# Patient Record
Sex: Male | Born: 1995 | Race: Black or African American | Hispanic: No | Marital: Single | Smoking: Current some day smoker
Health system: Southern US, Community
[De-identification: ages and names within clinical notes are randomized; demographics above are authoritative.]

## PROBLEM LIST (undated history)

## (undated) DIAGNOSIS — E119 Type 2 diabetes mellitus without complications: Secondary | ICD-10-CM

## (undated) DIAGNOSIS — E1010 Type 1 diabetes mellitus with ketoacidosis without coma: Secondary | ICD-10-CM

## (undated) HISTORY — PX: HAND SURGERY: SHX662

## (undated) HISTORY — PX: UMBILICAL HERNIA REPAIR: SHX196

## (undated) HISTORY — PX: TONSILLECTOMY: SUR1361

## (undated) HISTORY — PX: TONSILLECTOMY AND ADENOIDECTOMY: SHX28

---
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
## 2016-09-01 ENCOUNTER — Encounter: Payer: Self-pay | Admitting: Emergency Medicine

## 2016-09-01 ENCOUNTER — Inpatient Hospital Stay
Admission: EM | Admit: 2016-09-01 | Discharge: 2016-09-03 | DRG: 638 | Disposition: A | Discharge status: Home / Self Care | Payer: Medicaid Other | Attending: Internal Medicine | Admitting: Internal Medicine

## 2016-09-01 DIAGNOSIS — F17210 Nicotine dependence, cigarettes, uncomplicated: Secondary | ICD-10-CM | POA: Diagnosis present

## 2016-09-01 DIAGNOSIS — E1010 Type 1 diabetes mellitus with ketoacidosis without coma: Secondary | ICD-10-CM | POA: Diagnosis present

## 2016-09-01 DIAGNOSIS — N179 Acute kidney failure, unspecified: Secondary | ICD-10-CM | POA: Diagnosis present

## 2016-09-01 DIAGNOSIS — Z833 Family history of diabetes mellitus: Secondary | ICD-10-CM | POA: Diagnosis not present

## 2016-09-01 DIAGNOSIS — E10649 Type 1 diabetes mellitus with hypoglycemia without coma: Secondary | ICD-10-CM | POA: Diagnosis not present

## 2016-09-01 DIAGNOSIS — Z794 Long term (current) use of insulin: Secondary | ICD-10-CM

## 2016-09-01 DIAGNOSIS — Z23 Encounter for immunization: Secondary | ICD-10-CM

## 2016-09-01 DIAGNOSIS — E1110 Type 2 diabetes mellitus with ketoacidosis without coma: Secondary | ICD-10-CM | POA: Diagnosis present

## 2016-09-01 LAB — URINALYSIS COMPLETE WITH MICROSCOPIC (ARMC ONLY)
BACTERIA UA: NONE SEEN
BILIRUBIN URINE: NEGATIVE
HGB URINE DIPSTICK: NEGATIVE
Leukocytes, UA: NEGATIVE
Nitrite: NEGATIVE
Protein, ur: NEGATIVE mg/dL
RBC / HPF: NONE SEEN RBC/hpf (ref 0–5)
Specific Gravity, Urine: 1.026 (ref 1.005–1.030)
pH: 5 (ref 5.0–8.0)

## 2016-09-01 LAB — BASIC METABOLIC PANEL
ANION GAP: 9 (ref 5–15)
Anion gap: 20 — ABNORMAL HIGH (ref 5–15)
BUN: 16 mg/dL (ref 6–20)
BUN: 13 mg/dL (ref 6–20)
CHLORIDE: 102 mmol/L (ref 101–111)
CO2: 15 mmol/L — AB (ref 22–32)
CO2: 22 mmol/L (ref 22–32)
CREATININE: 1.42 mg/dL — AB (ref 0.61–1.24)
Calcium: 9.1 mg/dL (ref 8.9–10.3)
Calcium: 9 mg/dL (ref 8.9–10.3)
Chloride: 106 mmol/L (ref 101–111)
Creatinine, Ser: 0.99 mg/dL (ref 0.61–1.24)
GFR calc Af Amer: >60 mL/min (ref >60)
GFR calc Af Amer: >60 mL/min (ref >60)
GFR calc non Af Amer: >60 mL/min (ref >60)
GLUCOSE: 360 mg/dL — AB (ref 65–99)
GLUCOSE: 75 mg/dL (ref 65–99)
POTASSIUM: 4 mmol/L (ref 3.5–5.1)
Potassium: 4.4 mmol/L (ref 3.5–5.1)
SODIUM: 137 mmol/L (ref 135–145)
Sodium: 137 mmol/L (ref 135–145)

## 2016-09-01 LAB — GLUCOSE, CAPILLARY
GLUCOSE-CAPILLARY: 350 mg/dL — AB (ref 65–99)
GLUCOSE-CAPILLARY: 72 mg/dL (ref 65–99)
GLUCOSE-CAPILLARY: 83 mg/dL (ref 65–99)
GLUCOSE-CAPILLARY: 114 mg/dL — AB (ref 65–99)
Glucose-Capillary: 106 mg/dL — ABNORMAL HIGH (ref 65–99)
Glucose-Capillary: 125 mg/dL — ABNORMAL HIGH (ref 65–99)
Glucose-Capillary: 67 mg/dL (ref 65–99)

## 2016-09-01 LAB — CBC
HEMATOCRIT: 49.5 % (ref 40.0–52.0)
Hemoglobin: 16.6 g/dL (ref 13.0–18.0)
MCH: 30.3 pg (ref 26.0–34.0)
MCHC: 33.6 g/dL (ref 32.0–36.0)
MCV: 90 fL (ref 80.0–100.0)
PLATELETS: 248 10*3/uL (ref 150–440)
RBC: 5.49 MIL/uL (ref 4.40–5.90)
RDW: 13.5 % (ref 11.5–14.5)
WBC: 6.2 10*3/uL (ref 3.8–10.6)

## 2016-09-01 LAB — MRSA PCR SCREENING: MRSA by PCR: NEGATIVE

## 2016-09-01 MED ORDER — ENOXAPARIN SODIUM 40 MG/0.4ML ~~LOC~~ SOLN
40.0000 mg | SUBCUTANEOUS | Status: DC
  Administered 2016-09-01 – 2016-09-02 (×2): 40 mg via SUBCUTANEOUS
  Filled 2016-09-01 (×2): qty 0.4

## 2016-09-01 MED ORDER — SODIUM CHLORIDE 0.9 % IV SOLN
INTRAVENOUS | Status: DC
  Administered 2016-09-01: 2.9 [IU]/h via INTRAVENOUS
  Filled 2016-09-01: qty 2.5

## 2016-09-01 MED ORDER — DEXTROSE-NACL 5-0.45 % IV SOLN
INTRAVENOUS | Status: DC
  Administered 2016-09-01 – 2016-09-03 (×3): via INTRAVENOUS

## 2016-09-01 MED ORDER — ACETAMINOPHEN 325 MG PO TABS: 650.0000 mg | ORAL_TABLET | Freq: Four times a day (QID) | ORAL | Status: DC | PRN

## 2016-09-01 MED ORDER — INSULIN ASPART 100 UNIT/ML ~~LOC~~ SOLN
0.0000 [IU] | Freq: Every day | SUBCUTANEOUS | Status: DC
  Administered 2016-09-02: 2 [IU] via SUBCUTANEOUS
  Filled 2016-09-01: qty 2

## 2016-09-01 MED ORDER — SODIUM CHLORIDE 0.9 % IV SOLN: INTRAVENOUS | Status: AC

## 2016-09-01 MED ORDER — SODIUM CHLORIDE 0.9% FLUSH
3.0000 mL | Freq: Two times a day (BID) | INTRAVENOUS | Status: DC
  Administered 2016-09-01 – 2016-09-02 (×2): 3 mL via INTRAVENOUS

## 2016-09-01 MED ORDER — ACETAMINOPHEN 650 MG RE SUPP: 650.0000 mg | Freq: Four times a day (QID) | RECTAL | Status: DC | PRN

## 2016-09-01 MED ORDER — INSULIN ASPART 100 UNIT/ML ~~LOC~~ SOLN
0.0000 [IU] | Freq: Three times a day (TID) | SUBCUTANEOUS | Status: DC
  Administered 2016-09-02: 2 [IU] via SUBCUTANEOUS
  Administered 2016-09-02: 3 [IU] via SUBCUTANEOUS
  Filled 2016-09-01: qty 2
  Filled 2016-09-01: qty 8

## 2016-09-01 MED ORDER — POTASSIUM CHLORIDE IN NACL 20-0.9 MEQ/L-% IV SOLN
INTRAVENOUS | Status: DC
  Administered 2016-09-01: 17:00:00 via INTRAVENOUS
  Filled 2016-09-01: qty 1000

## 2016-09-01 MED ORDER — SODIUM CHLORIDE 0.9 % IV SOLN
INTRAVENOUS | Status: DC
  Filled 2016-09-01: qty 2.5

## 2016-09-01 MED ORDER — ONDANSETRON HCL 4 MG PO TABS: 4.0000 mg | ORAL_TABLET | Freq: Four times a day (QID) | ORAL | Status: DC | PRN

## 2016-09-01 MED ORDER — SODIUM CHLORIDE 0.9 % IV SOLN
INTRAVENOUS | Status: DC
  Administered 2016-09-02 (×2): via INTRAVENOUS

## 2016-09-01 MED ORDER — SODIUM CHLORIDE 0.9 % IV BOLUS (SEPSIS)
1000.0000 mL | Freq: Once | INTRAVENOUS | Status: AC
  Administered 2016-09-01: 1000 mL via INTRAVENOUS

## 2016-09-01 MED ORDER — ONDANSETRON HCL 4 MG/2ML IJ SOLN: 4.0000 mg | Freq: Four times a day (QID) | INTRAMUSCULAR | Status: DC | PRN

## 2016-09-01 MED ORDER — INFLUENZA VAC SPLIT QUAD 0.5 ML IM SUSY: 0.5000 mL | PREFILLED_SYRINGE | INTRAMUSCULAR | Status: DC

## 2016-09-01 NOTE — ED Triage Notes (Addendum)
Pt to ED via EMS from home for hyperglycemia, per EMS glucose 377, fluid bolus going. Pt has been vomiting today. Per EMS pt lost his glucometer x2days ago and hasn't checked it. Pt has had aprox of fluid en route.  Pt A&Ox4

## 2016-09-01 NOTE — ED Notes (Signed)
Pt states he has been feeling drowsy with some intermit nausea.

## 2016-09-01 NOTE — ED Notes (Signed)
ED Provider at bedside. 

## 2016-09-01 NOTE — ED Notes (Signed)
Pt given diet drink, sugar free applesauce and graham crakers

## 2016-09-01 NOTE — ED Provider Notes (Signed)
[facility] Emergency Department Provider Note  ____________________________________________   I have reviewed the triage vital signs and the nursing notes.   HISTORY  Chief Complaint Hyperglycemia   History limited by: Not Limited   HPI Wesley [name] is a 20 y.o. male with history of diabetes who presents to the emergency department today because he has been feeling unwell and concerned that his sugars are high. The patient states that he has not had his glucometer for the past few days. He has still been giving himself his insulin however has been doing just based on how he feels. His morning he woke up he started feeling bad. He started feeling weak. There is some nausea. He also was not close to his insulin this morning. As he continued to feel worse he realized he had to come to the emergency department. He has been in diabetic ketoacidosis in the past and states this reminds him of how he felt. He denies any fevers or recent illness.   Past Medical History:  Diagnosis Date  . Diabetes mellitus without complication [facility](HCC)     Patient Active Problem List   Diagnosis Date Noted  . Sepsis (HCC) 10/02/2015  . Scrotal abscess   . Diabetes mellitus with ketoacidosis 08/13/2012  . Type 1 diabetes mellitus (HCC) 02/06/2012    Past Surgical History:  Procedure Laterality Date  . HAND SURGERY    . INCISION AND DRAINAGE ABSCESS N/A 10/02/2015   Procedure: INCISION AND DRAINAGE ABSCESS;  Surgeon: Jerilee FieldMatthew Eskridge, MD;  Location: [facility] ORS;  Service: Urology;  Laterality: N/A;  . TONSILLECTOMY AND ADENOIDECTOMY    . UMBILICAL HERNIA REPAIR      Prior to Admission medications   Medication Sig Start Date End Date Taking? Authorizing Provider  insulin aspart (NOVOLOG) 100 UNIT/ML injection Inject 10-15 Units into the skin 3 (three) times daily before meals. Per sliding scale    Historical Provider, MD  insulin glargine (LANTUS) 100 UNIT/ML injection Inject 40  Units into the skin daily.    Historical Provider, MD  sulfamethoxazole-trimethoprim (BACTRIM DS,SEPTRA DS) 800-160 MG tablet Take 1 tablet by mouth 2 (two) times daily. 10/03/15   Houston SirenVivek J Sainani, MD    Allergies Patient has no known allergies.  Family History  Problem Relation Age of Onset  . Diabetes Mellitus II Paternal Grandfather     Social History Social History  Substance Use Topics  . Smoking status: Current Some Day Smoker    Types: Cigarettes  . Smokeless tobacco: Never Used  . Alcohol use No    Review of Systems  Constitutional: Negative for fever. Cardiovascular: Negative for chest pain. Respiratory: Negative for shortness of breath. Gastrointestinal: Negative for abdominal pain. Neurological: Negative for headaches, focal weakness or numbness.  10-point ROS otherwise negative.  ____________________________________________   PHYSICAL EXAM:  VITAL SIGNS: ED Triage Vitals  Enc Vitals Group     BP 09/01/16 1504 119/78     Pulse Rate 09/01/16 1504 95     Resp 09/01/16 1504 16     Temp 09/01/16 1504 98 F (36.7 C)     Temp Source 09/01/16 1504 Oral     SpO2 09/01/16 1504 100 %     Weight 09/01/16 1500 170 lb (77.1 kg)     Height 09/01/16 1500 5\' 9"  (1.753 m)     Head Circumference --      Peak Flow --      Pain Score 09/01/16 1500 0   Constitutional: Alert and oriented.  Well appearing and in no distress. Eyes: Conjunctivae are normal. Normal extraocular movements. ENT   Head: Normocephalic and atraumatic.   Nose: No congestion/rhinnorhea.   Mouth/Throat: Mucous membranes are moist.   Neck: No stridor. Hematological/Lymphatic/Immunilogical: No cervical lymphadenopathy. Cardiovascular: Normal rate, regular rhythm.  No murmurs, rubs, or gallops.  Respiratory: Normal respiratory effort without tachypnea nor retractions. Breath sounds are clear and equal bilaterally. No wheezes/rales/rhonchi. Gastrointestinal: Soft and nontender. No  distention.  Genitourinary: Deferred Musculoskeletal: Normal range of motion in all extremities. No lower extremity edema. Neurologic:  Normal speech and language. No gross focal neurologic deficits are appreciated.  Skin:  Skin is warm, dry and intact. No rash noted. Psychiatric: Mood and affect are normal. Speech and behavior are normal. Patient exhibits appropriate insight and judgment.  ____________________________________________    LABS (pertinent positives/negatives)  Labs Reviewed  BASIC METABOLIC PANEL - Abnormal; Notable for the following:       Result Value   CO2 15 (*)    Glucose, Bld 360 (*)    Creatinine, Ser 1.42 (*)    Anion gap 20 (*)    All other components within normal limits  URINALYSIS COMPLETEWITH MICROSCOPIC ([facility] ONLY) - Abnormal; Notable for the following:    Color, Urine STRAW (*)    APPearance CLEAR (*)    Glucose, UA >500 (*)    Ketones, ur 2+ (*)    Squamous Epithelial / LPF 0-5 (*)    All other components within normal limits  GLUCOSE, CAPILLARY - Abnormal; Notable for the following:    Glucose-Capillary 350 (*)    All other components within normal limits  BLOOD GAS, VENOUS - Abnormal; Notable for the following:    pH, Ven 7.17 (*)    pO2, Ven <31.0 (*)    Bicarbonate 16.4 (*)    Acid-base deficit 11.9 (*)    All other components within normal limits  CBC  CBG MONITORING, ED     ____________________________________________   EKG  None  ____________________________________________    RADIOLOGY  None  ____________________________________________   PROCEDURES  Procedures  CRITICAL CARE Performed by: Phineas Semen   Total critical care time: 20 minutes  Critical care time was exclusive of separately billable procedures and treating other patients.  Critical care was necessary to treat or prevent imminent or life-threatening deterioration.  Critical care was time spent personally by me on the following activities:  development of treatment plan with patient and/or surrogate as well as nursing, discussions with consultants, evaluation of patient's response to treatment, examination of patient, obtaining history from patient or surrogate, ordering and performing treatments and interventions, ordering and review of laboratory studies, ordering and review of radiographic studies, pulse oximetry and re-evaluation of patient's condition.  ____________________________________________   INITIAL IMPRESSION / ASSESSMENT AND PLAN / ED COURSE  Pertinent labs & imaging results that were available during my care of the patient were reviewed by me and considered in my medical decision making (see chart for details).  Patient presented to the emergency department today because of concerns for elevated blood sugar and not feeling well. Patient does have a history of DKA in the past and states this feels similar to his previous episodes. I do have some concern that the patient might be in DKA again. Will check blood work as well as VBG.  Clinical Course    Blood work and VBG is consistent with DKA. Will start patient on insulin drip. Patient will require admission to the hospital. ____________________________________________   FINAL  CLINICAL IMPRESSION(S) / ED DIAGNOSES  Final diagnoses:  Diabetic ketoacidosis without coma associated with type 1 diabetes mellitus (HCC)     Note: This dictation was prepared with Dragon dictation. Any transcriptional errors that result from this process are unintentional    Phineas SemenGraydon Annalysa Mohammad, MD 09/01/16 [phone]

## 2016-09-01 NOTE — H&P (Signed)
[facility] - [place] at [facility]   PATIENT NAME: Wesley [name]    MR#:  [id number]  DATE OF BIRTH:  03-18-1996  DATE OF ADMISSION:  09/01/2016  PRIMARY CARE PHYSICIAN: [facility]   REQUESTING/REFERRING PHYSICIAN: Dr. Tery SanfilippoGrayden Goodman  CHIEF COMPLAINT:   Chief Complaint  Patient presents with  . Hyperglycemia    HISTORY OF PRESENT ILLNESS:  Wesley [name]  is a 20 y.o. male with a known history of Type 1DM for almost 14 years who is active at baseline presents with worsening nausea and abdominal pain. Patient has been using lantus every night, but hasn't had his glucometer due to recent moving, so has not been using novolog sliding scale. His symptoms started for the last 2 days with increased thirst, nausea and abdominal pain. So he presented to the hospital early as he realized that this turn into DKA. His sugars are >300, but anion gap of 20. So, being admitted to DKA.  PAST MEDICAL HISTORY:   Past Medical History:  Diagnosis Date  . Diabetes mellitus without complication (HCC)    type 1 diabetes    PAST SURGICAL HISTORY:   Past Surgical History:  Procedure Laterality Date  . HAND SURGERY    . INCISION AND DRAINAGE ABSCESS N/A 10/02/2015   Procedure: INCISION AND DRAINAGE ABSCESS;  Surgeon: Jerilee FieldMatthew Eskridge, MD;  Location: [facility] ORS;  Service: Urology;  Laterality: N/A;  . TONSILLECTOMY AND ADENOIDECTOMY    . UMBILICAL HERNIA REPAIR      SOCIAL HISTORY:   Social History  Substance Use Topics  . Smoking status: Current Some Day Smoker    Packs/day: 0.50    Types: Cigarettes  . Smokeless tobacco: Never Used  . Alcohol use Yes     Comment: occasional    FAMILY HISTORY:   Family History  Problem Relation Age of Onset  . Diabetes Mellitus II Paternal Grandfather     DRUG ALLERGIES:  No Known Allergies  REVIEW OF SYSTEMS:   Review of Systems  Constitutional: Negative for chills, fever, malaise/fatigue and weight  loss.  HENT: Negative for ear discharge, ear pain, hearing loss and nosebleeds.   Eyes: Negative for blurred vision, double vision and photophobia.  Respiratory: Negative for cough, hemoptysis, shortness of breath and wheezing.   Cardiovascular: Negative for chest pain, palpitations, orthopnea and leg swelling.  Gastrointestinal: Positive for abdominal pain, nausea and vomiting. Negative for constipation, diarrhea, heartburn and melena.  Genitourinary: Positive for frequency. Negative for dysuria and urgency.  Musculoskeletal: Negative for back pain, myalgias and neck pain.  Skin: Negative for rash.  Neurological: Negative for dizziness, tremors, sensory change, speech change, focal weakness and headaches.  Endo/Heme/Allergies: Does not bruise/bleed easily.  Psychiatric/Behavioral: Negative for depression.    MEDICATIONS AT HOME:   Prior to Admission medications   Medication Sig Start Date End Date Taking? Authorizing Provider  insulin aspart (NOVOLOG) 100 UNIT/ML injection Inject 5-10 Units into the skin 3 (three) times daily before meals. Per sliding scale    Yes Historical Provider, MD  insulin glargine (LANTUS) 100 UNIT/ML injection Inject 50 Units into the skin daily.    Yes Historical Provider, MD      VITAL SIGNS:  Blood pressure 119/78, pulse 83, temperature 98 F (36.7 C), temperature source Oral, resp. rate 17, height 5\' 9"  (1.753 m), weight 77.1 kg (170 lb), SpO2 100 %.  PHYSICAL EXAMINATION:   Physical Exam  GENERAL:  20 y.o.-year-old patient lying in the bed with no acute  distress.  EYES: Pupils equal, round, reactive to light and accommodation. No scleral icterus. Extraocular muscles intact.  HEENT: Head atraumatic, normocephalic. Oropharynx and nasopharynx clear.  NECK:  Supple, no jugular venous distention. No thyroid enlargement, no tenderness.  LUNGS: Normal breath sounds bilaterally, no wheezing, rales,rhonchi or crepitation. No use of accessory muscles of  respiration.  CARDIOVASCULAR: S1, S2 normal. No murmurs, rubs, or gallops.  ABDOMEN: Soft, nontender, nondistended. Bowel sounds present. No organomegaly or mass.  EXTREMITIES: No pedal edema, cyanosis, or clubbing.  NEUROLOGIC: Cranial nerves II through XII are intact. Muscle strength 5/5 in all extremities. Sensation intact. Gait not checked.  PSYCHIATRIC: The patient is alert and oriented x 3.  SKIN: No obvious rash, lesion, or ulcer.   LABORATORY PANEL:   CBC  Recent Labs Lab 09/01/16 1505  WBC 6.2  HGB 16.6  HCT 49.5  PLT 248   ------------------------------------------------------------------------------------------------------------------  Chemistries   Recent Labs Lab 09/01/16 1505  NA 137  K 4.4  CL 102  CO2 15*  GLUCOSE 360*  BUN 16  CREATININE 1.42*  CALCIUM 9.1   ------------------------------------------------------------------------------------------------------------------  Cardiac Enzymes No results for input(s): TROPONINI in the last 168 hours. ------------------------------------------------------------------------------------------------------------------  RADIOLOGY:  No results found.  EKG:  No orders found for this or any previous visit.  IMPRESSION AND PLAN:   Wesley [name]  is a 20 y.o. male with a known history of Type 1DM for almost 14 years who is active at baseline presents with worsening nausea and abdominal pain.  #1 DKA- check a1c, started on insulin drip - admit to step down. - diabetes coordinator, needs a glucometer prior to discharge  #2 Metabolic acidosis- secondary to DKA - fluids and insulin drip  #3 CKD- fluids and monitor  #4 DVT prophylaxis- lovenox    All the records are reviewed and case discussed with ED provider. Management plans discussed with the patient, family and they are in agreement.  CODE STATUS: Full Code  TOTAL TIME TAKING CARE OF THIS PATIENT: 50 minutes.    Enid BaasKALISETTI,Belva Koziel M.D on  09/01/2016 at 6:03 PM  Between 7am to 6pm - Pager - [phone]  After 6pm go to [email or web address] - Social research officer, governmentpassword EPAS [email or web address]  [facility]  Office  [phone]  CC: Primary care physician; [facility]

## 2016-09-02 LAB — BASIC METABOLIC PANEL
ANION GAP: 7 (ref 5–15)
BUN: 13 mg/dL (ref 6–20)
CALCIUM: 8.9 mg/dL (ref 8.9–10.3)
CO2: 23 mmol/L (ref 22–32)
CREATININE: 0.9 mg/dL (ref 0.61–1.24)
Chloride: 107 mmol/L (ref 101–111)
GLUCOSE: 266 mg/dL — AB (ref 65–99)
Potassium: 4 mmol/L (ref 3.5–5.1)
Sodium: 137 mmol/L (ref 135–145)

## 2016-09-02 LAB — GLUCOSE, CAPILLARY
GLUCOSE-CAPILLARY: 347 mg/dL — AB (ref 65–99)
GLUCOSE-CAPILLARY: 56 mg/dL — AB (ref 65–99)
GLUCOSE-CAPILLARY: 64 mg/dL — AB (ref 65–99)
GLUCOSE-CAPILLARY: 237 mg/dL — AB (ref 65–99)
GLUCOSE-CAPILLARY: 56 mg/dL — AB (ref 65–99)
GLUCOSE-CAPILLARY: 84 mg/dL (ref 65–99)
Glucose-Capillary: 186 mg/dL — ABNORMAL HIGH (ref 65–99)
Glucose-Capillary: 48 mg/dL — ABNORMAL LOW (ref 65–99)
Glucose-Capillary: 228 mg/dL — ABNORMAL HIGH (ref 65–99)

## 2016-09-02 MED ORDER — INSULIN ASPART 100 UNIT/ML ~~LOC~~ SOLN
5.0000 [IU] | Freq: Three times a day (TID) | SUBCUTANEOUS | Status: DC
  Administered 2016-09-02: 5 [IU] via SUBCUTANEOUS

## 2016-09-02 MED ORDER — INSULIN GLARGINE 100 UNIT/ML ~~LOC~~ SOLN
35.0000 [IU] | Freq: Every day | SUBCUTANEOUS | Status: DC
  Administered 2016-09-02 (×2): 35 [IU] via SUBCUTANEOUS
  Filled 2016-09-02 (×3): qty 0.35

## 2016-09-02 MED ORDER — INFLUENZA VAC SPLIT QUAD 0.5 ML IM SUSY
0.5000 mL | PREFILLED_SYRINGE | Freq: Once | INTRAMUSCULAR | Status: AC
  Administered 2016-09-03: 0.5 mL via INTRAMUSCULAR
  Filled 2016-09-02: qty 0.5

## 2016-09-02 NOTE — Progress Notes (Signed)
Inpatient Diabetes Program Recommendations  AACE/ADA: New Consensus Statement on Inpatient Glycemic Control (2015)  Target Ranges:  Prepandial:   less than 140 mg/dL      Peak postprandial:   less than 180 mg/dL (1-2 hours)      Critically ill patients:  140 - 180 mg/dL   Lab Results  Component Value Date   GLUCAP 186 (H) 09/02/2016   HGBA1C 9.4 (H) 10/02/2015    Review of Glycemic Control:  Results for Wesley [name], Wesley (MRN [id number]) as of 09/02/2016 10:56  Ref. Range 09/01/2016 20:50 09/01/2016 21:22 09/01/2016 22:37 09/02/2016 02:55 09/02/2016 07:17  Glucose-Capillary Latest Ref Range: 65 - 99 mg/dL 67 83 119114 (H) 147347 (H) 829186 (H)   Diabetes history: Type 1 diabetes since age 89 Outpatient Diabetes medications: Lantus 50 units daily, Novolog 5-10 units tid with meals Current orders for Inpatient glycemic control:  Lantus 35 units daily at HS, Novolog sensitive tid with meals and HS Inpatient Diabetes Program Recommendations:    Spoke to patient at length regarding diabetes management.  He [facility] but has not seen them in greater than 6 months.  Recommended that he call and get post-discharge appointment.  Discussed diabetes and patient states it is "hard".  He had lost his meter and therefore was going by how he "feels" to take insulin.  Discussed sick day rules and the importance of monitoring/taking insulin.  Patient states "I know, I just need to do better".  Upon d/c will need prescription for glucose meter-(patient has medicaid).  He is currently not in school.  He is unsure if medicaid will continue past February when he turns 21?  Needs to discuss with medicaid and provider so that clear transition plan is in place.  He does plan to go to [facility] in the spring (which may have insurance options).  Currently he is not employed.  He has plenty of insulin at this time but if insurance ends in February, access will be an issue.  Patient seems to know what to do  but admits that having diabetes 24 hours a day/7 days a week is hard.  Discussed with RN.  Sent text page to MD, requesting CHO/meal coverage.  Case management consult placed.    Thanks, Beryl MeagerJenny Ramyah Pankowski, RN, BC-ADM Inpatient Diabetes Coordinator Pager [phone] (8a-5p)

## 2016-09-02 NOTE — Progress Notes (Signed)
Report was given to Ree KidaJack, RN on 2-C. Pt was transferred to room 214 via wheelchair w/ his girlfriend carrying his personal belongings bag. Chart given to Tishia on 2-C.

## 2016-09-02 NOTE — Progress Notes (Signed)
Pt CBG at 1700 was 56.  Gave orange juice.  Rechecked CBG at 17:20 and was 84.  Bradly Chrisougherty, Bayan Kushnir E, RN

## 2016-09-02 NOTE — Care Management Note (Signed)
Case Management Note  Patient Details  Name: Wesley [name] MRN: [id number] Date of Birth: 04-10-1996  Subjective/Objective:                  Met with patient and his girlfriend Wesley [name] [phone] to discuss discharge planning. His endocrinologist was seen per patient 6-7 months ago- Dr. Herbie Baltimore at [facility] in [place], [place]. Patient wants something closer to [facility] but will need to go through his PCP/Medicaid to change. Appointment schedule with [facility] for Monday 09/08/16 at [place]. However, clinic said that patient has not been seen there in a while and now that he's over 19 he may not be able to be seen if no longer a patient. Patient will then need to establish with new PCP through [facility] officer- he has been informed of appt and that his pediatrician will call him if they cannot see him Monday. He does not have a phone at this time. He will need a glucometer Rx prior to discharge- advised to picked up [employer]. He drives and has transportation. He denies having problems obtaining medications.  Action/Plan:   Glucometer Rx requested from MD. No further RNCM needs.  Expected Discharge Date:                  Expected Discharge Plan:     In-House Referral:     Discharge planning Services  CM Consult, Follow-up appt scheduled  Post Acute Care Choice:    Choice offered to:     DME Arranged:    DME Agency:     HH Arranged:    HH Agency:     Status of Service:  Completed, signed off  If discussed at [employer] of Stay Meetings, dates discussed:    Additional Comments:  Marshell Garfinkel, RN 09/02/2016, 11:07 AM

## 2016-09-02 NOTE — Progress Notes (Addendum)
[facility] [place] [facility]   PATIENT NAME: Wesley [name]    MR#:  [id number]  DATE OF BIRTH:  Aug 18, 1996  SUBJECTIVE:  CHIEF COMPLAINT:  Pt is doing fine, no nausea or vomiting. Denies any abd pain. Lost glucometer during recent move  REVIEW OF SYSTEMS:  CONSTITUTIONAL: No fever, fatigue or weakness.  EYES: No blurred or double vision.  EARS, NOSE, AND THROAT: No tinnitus or ear pain.  RESPIRATORY: No cough, shortness of breath, wheezing or hemoptysis.  CARDIOVASCULAR: No chest pain, orthopnea, edema.  GASTROINTESTINAL: No nausea, vomiting, diarrhea or abdominal pain.  GENITOURINARY: No dysuria, hematuria.  ENDOCRINE: No polyuria, nocturia,  HEMATOLOGY: No anemia, easy bruising or bleeding SKIN: No rash or lesion. MUSCULOSKELETAL: No joint pain or arthritis.   NEUROLOGIC: No tingling, numbness, weakness.  PSYCHIATRY: No anxiety or depression.   DRUG ALLERGIES:  No Known Allergies  VITALS:  Blood pressure 126/71, pulse 69, temperature 98.2 F (36.8 C), temperature source Oral, resp. rate 20, height 5\' 9"  (1.753 m), weight 77.2 kg (170 lb 3.1 oz), SpO2 100 %.  PHYSICAL EXAMINATION:  GENERAL:  20 y.o.-year-old patient lying in the bed with no acute distress.  EYES: Pupils equal, round, reactive to light and accommodation. No scleral icterus. Extraocular muscles intact.  HEENT: Head atraumatic, normocephalic. Oropharynx and nasopharynx clear.  NECK:  Supple, no jugular venous distention. No thyroid enlargement, no tenderness.  LUNGS: Normal breath sounds bilaterally, no wheezing, rales,rhonchi or crepitation. No use of accessory muscles of respiration.  CARDIOVASCULAR: S1, S2 normal. No murmurs, rubs, or gallops.  ABDOMEN: Soft, nontender, nondistended. Bowel sounds present. No organomegaly or mass.  EXTREMITIES: No pedal edema, cyanosis, or clubbing.  NEUROLOGIC: Cranial nerves II through XII are intact. Muscle strength 5/5 in all extremities.  Sensation intact. Gait not checked.  PSYCHIATRIC: The patient is alert and oriented x 3.  SKIN: No obvious rash, lesion, or ulcer.    LABORATORY PANEL:   CBC  Recent Labs Lab 09/01/16 1505  WBC 6.2  HGB 16.6  HCT 49.5  PLT 248   ------------------------------------------------------------------------------------------------------------------  Chemistries   Recent Labs Lab 09/02/16 0442  NA 137  K 4.0  CL 107  CO2 23  GLUCOSE 266*  BUN 13  CREATININE 0.90  CALCIUM 8.9   ------------------------------------------------------------------------------------------------------------------  Cardiac Enzymes No results for input(s): TROPONINI in the last 168 hours. ------------------------------------------------------------------------------------------------------------------  RADIOLOGY:  No results found.  EKG:  No orders found for this or any previous visit.  ASSESSMENT AND PLAN:   Wesley [name]  is a 20 y.o. male with a known history of Type 1DM for almost 14 years who is active at baseline presents with worsening nausea and abdominal pain.  #1 DKA- Resolved, d/ced insulin drip, started pt on Lantus 35 Units and Novolg 5 u TID Hb a1c 9.4  Continue IVF  needs a glucometer prior to discharge  #2 Metabolic acidosis- secondary to DKA Resolved with  fluids and insulin drip  #3 AKI on CKD-   IV fluids and monitor Check BMP AM  #4 DVT prophylaxis- lovenox     All the records are reviewed and case discussed with Care Management/Social Workerr. Management plans discussed with the patient, family and they are in agreement.  CODE STATUS: fc   TOTAL TIME TAKING CARE OF THIS PATIENT: 34 minutes.   POSSIBLE D/C IN am  DAYS, DEPENDING ON CLINICAL CONDITION.  Note: This dictation was prepared with Dragon dictation along with smaller phrase technology. Any transcriptional errors  that result from this process are unintentional.   Ramonita LabGouru, Tila Millirons M.D on  09/02/2016 at 7:22 PM  Between 7am to 6pm - Pager - [phone] After 6pm go to [email or web address] - password [email or web address] [facility]  [facility]  Office  [phone]  CC: Primary care physician; [facility]

## 2016-09-03 LAB — BLOOD GAS, VENOUS
ACID-BASE DEFICIT: 11.9 mmol/L — AB (ref 0.0–2.0)
BICARBONATE: 16.4 mmol/L — AB (ref 20.0–28.0)
FIO2: 0.21
Patient temperature: 37
pCO2, Ven: 45 mmHg (ref 44.0–60.0)
pH, Ven: 7.17 — CL (ref 7.250–7.430)

## 2016-09-03 LAB — BASIC METABOLIC PANEL
Anion gap: 8 (ref 5–15)
BUN: 9 mg/dL (ref 6–20)
CO2: 26 mmol/L (ref 22–32)
Calcium: 8.8 mg/dL — ABNORMAL LOW (ref 8.9–10.3)
Chloride: 108 mmol/L (ref 101–111)
Creatinine, Ser: 0.88 mg/dL (ref 0.61–1.24)
GFR calc Af Amer: >60 mL/min (ref >60)
GLUCOSE: 92 mg/dL (ref 65–99)
POTASSIUM: 3.6 mmol/L (ref 3.5–5.1)
Sodium: 142 mmol/L (ref 135–145)

## 2016-09-03 LAB — HEMOGLOBIN A1C
HEMOGLOBIN A1C: 11.7 % — AB (ref 4.8–5.6)
MEAN PLASMA GLUCOSE: 289 mg/dL

## 2016-09-03 LAB — GLUCOSE, CAPILLARY
GLUCOSE-CAPILLARY: 166 mg/dL — AB (ref 65–99)
GLUCOSE-CAPILLARY: 106 mg/dL — AB (ref 65–99)
GLUCOSE-CAPILLARY: 53 mg/dL — AB (ref 65–99)
GLUCOSE-CAPILLARY: 146 mg/dL — AB (ref 65–99)
Glucose-Capillary: 32 mg/dL — CL (ref 65–99)
Glucose-Capillary: 80 mg/dL (ref 65–99)
Glucose-Capillary: 56 mg/dL — ABNORMAL LOW (ref 65–99)
Glucose-Capillary: 102 mg/dL — ABNORMAL HIGH (ref 65–99)

## 2016-09-03 MED ORDER — DEXTROSE 50 % IV SOLN
INTRAVENOUS | Status: AC
  Administered 2016-09-03: 25 mL via INTRAVENOUS
  Filled 2016-09-03: qty 50

## 2016-09-03 MED ORDER — DEXTROSE 50 % IV SOLN
25.0000 mL | Freq: Once | INTRAVENOUS | Status: AC
  Administered 2016-09-03: 25 mL via INTRAVENOUS

## 2016-09-03 MED ORDER — INSULIN ASPART 100 UNIT/ML ~~LOC~~ SOLN: 4.0000 [IU] | Freq: Three times a day (TID) | SUBCUTANEOUS | Status: DC

## 2016-09-03 MED ORDER — INSULIN GLARGINE 100 UNIT/ML ~~LOC~~ SOLN: 28.0000 [IU] | Freq: Every day | SUBCUTANEOUS | 11 refills | Status: DC

## 2016-09-03 MED ORDER — BLOOD GLUCOSE MONITOR KIT: PACK | 0 refills | Status: DC

## 2016-09-03 MED ORDER — INSULIN GLARGINE 100 UNIT/ML ~~LOC~~ SOLN
28.0000 [IU] | Freq: Every day | SUBCUTANEOUS | Status: DC
  Filled 2016-09-03: qty 0.28

## 2016-09-03 NOTE — Progress Notes (Signed)
   [place] [facility] AT [facility] [address] [place], [place] [address]  September 03, 2016  Patient:  Wesley [name] Valeri Date of Birth: 1996-06-29 Date of Visit:  09/01/2016  To Whom it May Concern:  Please excuse Wesley [name] Capron from work from 09/01/2016 until 09/03/16 as he was admitted to the [facility] for medical treatment and has been receiving appropriate care and should be excused from obligations for that time period, including court appearance    Please don't hesitate to contact me with questions or concerns by calling  [phone] and asking them to page me directly.   Marge Duncansave Hower, MD

## 2016-09-03 NOTE — Progress Notes (Addendum)
Inpatient Diabetes Program Recommendations  AACE/ADA: New Consensus Statement on Inpatient Glycemic Control (2015)  Target Ranges:  Prepandial:   less than 140 mg/dL      Peak postprandial:   less than 180 mg/dL (1-2 hours)      Critically ill patients:  140 - 180 mg/dL   Lab Results  Component Value Date   GLUCAP 146 (H) 09/03/2016   HGBA1C 9.4 (H) 10/02/2015    Review of Glycemic Control:  Results for Wesley [name], Wesley [name] (MRN [id number]) as of 09/03/2016 09:45  Ref. Range 10/02/2015 08:18 10/02/2015 12:10 10/02/2015 16:35 10/02/2015 19:04 10/02/2015 21:38 10/03/2015 07:41 09/01/2016 15:05 09/01/2016 17:40 09/01/2016 18:23 09/01/2016 19:34 09/01/2016 20:50 09/01/2016 21:22 09/01/2016 22:37 09/02/2016 02:55 09/02/2016 07:17 09/02/2016 11:17 09/02/2016 11:20 09/02/2016 11:56 09/02/2016 13:11 09/02/2016 17:03 09/02/2016 17:37 09/02/2016 21:25 09/03/2016 02:08 09/03/2016 02:47 09/03/2016 05:16 09/03/2016 06:27 09/03/2016 07:47 09/03/2016 08:37  Glucose-Capillary Latest Ref Range: 65 - 99 mg/dL 409279 (H) 811284 (H) 914164 (H) 74 72 96 350 (H) 125 (H) 106 (H) 72 67 83 114 (H) 347 (H) 186 (H) 48 (L) 56 (L) 64 (L) 237 (H) 56 (L) 84 228 (H) 32 (LL) 166 (H) 80 106 (H) 53 (L) 146 (H)   Inpatient Diabetes Program Recommendations:    Note that patient has had lows with current insulin regimen.  He stated that he was taking Lantus 50 units at home, however based on his blood sugars this is too much insulin for him.  Please consider reducing Lantus to 28 units q HS.   Also please decrease Novolog meal coverage to 4 units tid with meals. Needs follow-up with Endocrinologist soon after discharge-States he [facility]. ? Transition to adult endocrinologist.  Thanks, Wesley MeagerJenny Darshan Solanki, RN, BC-ADM Inpatient Diabetes Coordinator Pager [phone] (8a-5p)

## 2016-09-03 NOTE — Progress Notes (Signed)
09/03/2016 12:35 PM  Sherre Scarlet to be D/C'd Home per MD order.  Discussed prescriptions and follow up appointments with the patient. Prescriptions given to patient, medication list explained in detail. Pt verbalized understanding.    Medication List    TAKE these medications   blood glucose meter kit and supplies Kit Dispense based on patient and insurance preference. Use up to four times daily as directed. (FOR ICD-9 250.00, 250.01).   insulin aspart 100 UNIT/ML injection Commonly known as:  novoLOG Inject 5-10 Units into the skin 3 (three) times daily before meals. Per sliding scale   insulin glargine 100 UNIT/ML injection Commonly known as:  LANTUS Inject 0.28 mLs (28 Units total) into the skin at bedtime. What changed:  how much to take  when to take this            Durable Medical Equipment        Start     Ordered   09/02/16 1116  DME Glucometer  Once    Comments:  And supplies   09/02/16 1115      Vitals:   09/03/16 0020 09/03/16 0518  BP: 116/68 (!) 98/56  Pulse: 66 62  Resp: 18 20  Temp: 98.1 F (36.7 C) 98 F (36.7 C)    Skin clean, dry and intact without evidence of skin break down, no evidence of skin tears noted. IV catheter discontinued intact. Site without signs and symptoms of complications. Dressing and pressure applied. Pt denies pain at this time. No complaints noted.  An After Visit Summary was printed and given to the patient. Patient escorted, and D/C home via private auto.  Wesley [name]

## 2016-09-03 NOTE — Progress Notes (Signed)
Pt BS rechecked and 166 . Report given to Tresa EndoKelly ,RN to Assume care

## 2016-09-03 NOTE — Progress Notes (Signed)
Pt became diaphoretic an stated that he felt as if his Blood sugar was dropping, pt CBG 32 pt was given orange juice and dextrose 25ml per hypoglycemic protocol.

## 2016-09-03 NOTE — Discharge Summary (Signed)
[facility] NAME: Wesley [name]    MR#:  [id number]  DATE OF BIRTH:  1996/07/13  DATE OF ADMISSION:  09/01/2016 ADMITTING PHYSICIAN: Gladstone Lighter, MD  DATE OF DISCHARGE: 09/03/16  PRIMARY CARE PHYSICIAN: [facility]    ADMISSION DIAGNOSIS:  Diabetic ketoacidosis without coma associated with type 1 diabetes mellitus ([place]) [E10.10]  DISCHARGE DIAGNOSIS:  Active Problems:   DKA (diabetic ketoacidoses) ([place])   SECONDARY DIAGNOSIS:   Past Medical History:  Diagnosis Date  . Diabetes mellitus without complication ([place])    type 1 diabetes    HOSPITAL COURSE:  Wesley [name]  is a 20 y.o. male admitted 09/01/2016 with chief complaint Hyperglycemia . Please see H&P performed by Gladstone Lighter, MD for further information. Patient presented in DKA, requiring insulin drip on admission. Successfully transitioned to basal insulin. His course was somewhat complicated by hypoglycemia requiring adjustments of insulin  DISCHARGE CONDITIONS:   stable  CONSULTS OBTAINED:    DRUG ALLERGIES:  No Known Allergies  DISCHARGE MEDICATIONS:   Current Discharge Medication List    START taking these medications   Details  blood glucose meter kit and supplies KIT Dispense based on patient and insurance preference. Use up to four times daily as directed. (FOR ICD-9 250.00, 250.01). Qty: 1 each, Refills: 0      CONTINUE these medications which have CHANGED   Details  insulin glargine (LANTUS) 100 UNIT/ML injection Inject 0.28 mLs (28 Units total) into the skin at bedtime. Qty: 10 mL, Refills: 11      CONTINUE these medications which have NOT CHANGED   Details  insulin aspart (NOVOLOG) 100 UNIT/ML injection Inject 5-10 Units into the skin 3 (three) times daily before meals. Per sliding scale          DISCHARGE INSTRUCTIONS:    DIET:  Diabetic diet  DISCHARGE CONDITION:  Good  ACTIVITY:    Activity as tolerated  OXYGEN:  Home Oxygen: No.   Oxygen Delivery: room air  DISCHARGE LOCATION:  home   If you experience worsening of your admission symptoms, develop shortness of breath, life threatening emergency, suicidal or homicidal thoughts you must seek medical attention immediately by calling 911 or calling your MD immediately  if symptoms less severe.  You Must read complete instructions/literature along with all the possible adverse reactions/side effects for all the Medicines you take and that have been prescribed to you. Take any new Medicines after you have completely understood and accpet all the possible adverse reactions/side effects.   Please note  You were cared for by a hospitalist during your hospital stay. If you have any questions about your discharge medications or the care you received while you were in the hospital after you are discharged, you can call the unit and asked to speak with the hospitalist on call if the hospitalist that took care of you is not available. Once you are discharged, your primary care physician will handle any further medical issues. Please note that NO REFILLS for any discharge medications will be authorized once you are discharged, as it is imperative that you return to your primary care physician (or establish a relationship with a primary care physician if you do not have one) for your aftercare needs so that they can reassess your need for medications and monitor your lab values.    On the day of Discharge:   VITAL SIGNS:  Blood pressure (!) 98/56, pulse 62, temperature 98 F (36.7  C), temperature source Oral, resp. rate 20, height '5\' 9"'  (1.753 m), weight 77.2 kg (170 lb 3.1 oz), SpO2 100 %.  I/O:   Intake/Output Summary (Last 24 hours) at 09/03/16 1122 Last data filed at 09/03/16 1001  Gross per 24 hour  Intake          2482.25 ml  Output                0 ml  Net          2482.25 ml    PHYSICAL EXAMINATION:  GENERAL:   20 y.o.-year-old patient lying in the bed with no acute distress.  EYES: Pupils equal, round, reactive to light and accommodation. No scleral icterus. Extraocular muscles intact.  HEENT: Head atraumatic, normocephalic. Oropharynx and nasopharynx clear.  NECK:  Supple, no jugular venous distention. No thyroid enlargement, no tenderness.  LUNGS: Normal breath sounds bilaterally, no wheezing, rales,rhonchi or crepitation. No use of accessory muscles of respiration.  CARDIOVASCULAR: S1, S2 normal. No murmurs, rubs, or gallops.  ABDOMEN: Soft, non-tender, non-distended. Bowel sounds present. No organomegaly or mass.  EXTREMITIES: No pedal edema, cyanosis, or clubbing.  NEUROLOGIC: Cranial nerves II through XII are intact. Muscle strength 5/5 in all extremities. Sensation intact. Gait not checked.  PSYCHIATRIC: The patient is alert and oriented x 3.  SKIN: No obvious rash, lesion, or ulcer.   DATA REVIEW:   CBC  Recent Labs Lab 09/01/16 1505  WBC 6.2  HGB 16.6  HCT 49.5  PLT 248    Chemistries   Recent Labs Lab 09/03/16 0431  NA 142  K 3.6  CL 108  CO2 26  GLUCOSE 92  BUN 9  CREATININE 0.88  CALCIUM 8.8*    Cardiac Enzymes No results for input(s): TROPONINI in the last 168 hours.  Microbiology Results  Results for orders placed or performed during the hospital encounter of 09/01/16  MRSA PCR Screening     Status: None   Collection Time: 09/01/16  8:48 PM  Result Value Ref Range Status   MRSA by PCR NEGATIVE NEGATIVE Final    Comment:        The GeneXpert MRSA Assay (FDA approved for NASAL specimens only), is one component of a comprehensive MRSA colonization surveillance program. It is not intended to diagnose MRSA infection nor to guide or monitor treatment for MRSA infections.     RADIOLOGY:  No results found.   Management plans discussed with the patient, family and they are in agreement.  CODE STATUS:     Code Status Orders        Start      Ordered   09/01/16 2048  Full code  Continuous     09/01/16 2047    Code Status History    Date Active Date Inactive Code Status Order ID Comments User Context   10/02/2015  8:11 AM 10/03/2015  4:05 PM Full Code [id number]  Harrie Foreman, MD Inpatient      TOTAL TIME TAKING CARE OF THIS PATIENT: 33 minutes.    Hower,  Karenann Cai.D on 09/03/2016 at 11:22 AM  Between 7am to 6pm - Pager - [phone]  After 6pm go to [email or web address] - [employer] [facility]  Office  [phone]  CC: Primary care physician; [facility]

## 2016-09-09 ENCOUNTER — Emergency Department: Payer: Medicaid Other

## 2016-09-09 ENCOUNTER — Emergency Department
Admission: EM | Admit: 2016-09-09 | Discharge: 2016-09-09 | Disposition: A | Discharge status: Home / Self Care | Payer: Medicaid Other | Attending: Emergency Medicine | Admitting: Emergency Medicine

## 2016-09-09 ENCOUNTER — Encounter: Payer: Self-pay | Admitting: Emergency Medicine

## 2016-09-09 DIAGNOSIS — W1839XA Other fall on same level, initial encounter: Secondary | ICD-10-CM | POA: Insufficient documentation

## 2016-09-09 DIAGNOSIS — S62307A Unspecified fracture of fifth metacarpal bone, left hand, initial encounter for closed fracture: Secondary | ICD-10-CM

## 2016-09-09 DIAGNOSIS — S6992XA Unspecified injury of left wrist, hand and finger(s), initial encounter: Secondary | ICD-10-CM | POA: Diagnosis present

## 2016-09-09 DIAGNOSIS — F17210 Nicotine dependence, cigarettes, uncomplicated: Secondary | ICD-10-CM | POA: Diagnosis not present

## 2016-09-09 DIAGNOSIS — Y929 Unspecified place or not applicable: Secondary | ICD-10-CM | POA: Insufficient documentation

## 2016-09-09 DIAGNOSIS — S62357A Nondisplaced fracture of shaft of fifth metacarpal bone, left hand, initial encounter for closed fracture: Secondary | ICD-10-CM | POA: Insufficient documentation

## 2016-09-09 DIAGNOSIS — Y939 Activity, unspecified: Secondary | ICD-10-CM | POA: Diagnosis not present

## 2016-09-09 DIAGNOSIS — E109 Type 1 diabetes mellitus without complications: Secondary | ICD-10-CM | POA: Insufficient documentation

## 2016-09-09 DIAGNOSIS — S62339A Displaced fracture of neck of unspecified metacarpal bone, initial encounter for closed fracture: Secondary | ICD-10-CM

## 2016-09-09 DIAGNOSIS — Y999 Unspecified external cause status: Secondary | ICD-10-CM | POA: Insufficient documentation

## 2016-09-09 MED ORDER — TRAMADOL HCL 50 MG PO TABS: 50.0000 mg | ORAL_TABLET | Freq: Four times a day (QID) | ORAL | 0 refills | Status: DC | PRN

## 2016-09-09 MED ORDER — MELOXICAM 15 MG PO TABS: 15.0000 mg | ORAL_TABLET | Freq: Every day | ORAL | 0 refills | Status: DC

## 2016-09-09 MED ORDER — TRAMADOL HCL 50 MG PO TABS
50.0000 mg | ORAL_TABLET | Freq: Once | ORAL | Status: AC
  Administered 2016-09-09: 50 mg via ORAL
  Filled 2016-09-09: qty 1

## 2016-09-09 NOTE — ED Triage Notes (Signed)
Pt to ed with c/o left hand pain and swelling after falling. Small abrasions noted to top of hand.

## 2016-09-09 NOTE — ED Notes (Signed)
Pt states fell on concrete steps and has L hand swelling, limited movement. Skin tear noted to middle finger base. Small amount of bleeding. Ice pack applied to hand. Previous break in this L hand where swelling is noticed.

## 2016-09-09 NOTE — ED Provider Notes (Signed)
[facility] Emergency Department Provider Note ____________________________________________  Time seen: Approximately 8:02 PM  I have reviewed the triage vital signs and the nursing notes.   HISTORY  Chief Complaint Hand Pain    HPI Wesley [name] is a 20 y.o. malethat presents after falling on concrete steps this afternoon. Patient states that he injured his left hand. Patient denies hitting head. Patient denies any other trauma. Patient has broken left hand before. Patient states that hand has been swelling. The patient has full sensation in fingers. Patient is able to move fingers. Patient has not taken anything for pain.  Past Medical History:  Diagnosis Date  . Diabetes mellitus without complication ([place])    type 1 diabetes    Patient Active Problem List   Diagnosis Date Noted  . DKA (diabetic ketoacidoses) ([place]) 09/01/2016  . Sepsis ([place]) 10/02/2015  . Scrotal abscess   . Diabetes mellitus with ketoacidosis 08/13/2012  . Type 1 diabetes mellitus ([place]) 02/06/2012    Past Surgical History:  Procedure Laterality Date  . HAND SURGERY    . INCISION AND DRAINAGE ABSCESS N/A 10/02/2015   Procedure: INCISION AND DRAINAGE ABSCESS;  Surgeon: Festus Aloe, MD;  Location: [facility] ORS;  Service: Urology;  Laterality: N/A;  . TONSILLECTOMY AND ADENOIDECTOMY    . UMBILICAL HERNIA REPAIR      Prior to Admission medications   Medication Sig Start Date End Date Taking? Authorizing Provider  blood glucose meter kit and supplies KIT Dispense based on patient and insurance preference. Use up to four times daily as directed. (FOR ICD-9 250.00, 250.01). 09/03/16   Lytle Butte, MD  insulin aspart (NOVOLOG) 100 UNIT/ML injection Inject 5-10 Units into the skin 3 (three) times daily before meals. Per sliding scale     Historical Provider, MD  insulin glargine (LANTUS) 100 UNIT/ML injection Inject 0.28 mLs (28 Units total) into the skin at bedtime. 09/03/16   Lytle Butte, MD  meloxicam (MOBIC) 15 MG tablet Take 1 tablet (15 mg total) by mouth daily. 09/09/16 09/09/17  Laban Emperor, PA-C  traMADol (ULTRAM) 50 MG tablet Take 1 tablet (50 mg total) by mouth every 6 (six) hours as needed. 09/09/16 09/09/17  Laban Emperor, PA-C    Allergies Patient has no known allergies.  Family History  Problem Relation Age of Onset  . Diabetes Mellitus II Paternal Grandfather     Social History Social History  Substance Use Topics  . Smoking status: Current Some Day Smoker    Packs/day: 0.50    Types: Cigarettes  . Smokeless tobacco: Never Used  . Alcohol use Yes     Comment: occasional    Review of Systems Constitutional: No recent illness. Cardiovascular: Denies chest pain or palpitations. Respiratory: Denies shortness of breath. Musculoskeletal: Pain in left hand. Skin: Wound to middle finger. Neurological: Negative for focal weakness or numbness.  ____________________________________________   PHYSICAL EXAM:  VITAL SIGNS: ED Triage Vitals [09/09/16 1843]  Enc Vitals Group     BP (!) 149/92     Pulse Rate 93     Resp 20     Temp 97.2 F (36.2 C)     Temp Source Oral     SpO2 99 %     Weight 170 lb (77.1 kg)     Height      Head Circumference      Peak Flow      Pain Score      Pain Loc  Pain Edu?      Excl. in [place]?     Constitutional: Alert and oriented. Well appearing and in no acute distress. Eyes: Conjunctivae are normal. EOMI. Head: Atraumatic. Neck: No stridor.  Cardiovascular: Regular rate and rhythm. 2+ radial pulses. Respiratory: Normal respiratory effort.   Musculoskeletal: Tender to palpation over fifth metacarpal. Swelling over the fourth and fifth metacarpal. Patient is able to move fingers. Patient has full range of motion of wrist and elbow. Neurologic:  Normal speech and language. No gross focal neurologic deficits are appreciated. Speech is normal. No gait instability. Full sensation of fingers. Skin:  Skin  is warm, dry. Patient has superficial skin tear over left middle finger at base. Psychiatric: Mood and affect are normal. Speech and behavior are normal.  ____________________________________________   LABS (all labs ordered are listed, but only abnormal results are displayed)  Labs Reviewed - No data to display ____________________________________________  RADIOLOGY  I, Laban Emperor, personally viewed and evaluated these images (plain radiographs) as part of my medical decision making, as well as reviewing the written report by the radiologist.  Xray findings per radiology IMPRESSION:  Recurrent left fifth metacarpal fracture, transverse with slight  comminution and moderate volar angulation similar to the 2015  injury.    PROCEDURES  Procedure(s) performed:    ____________________________________________   INITIAL IMPRESSION / ASSESSMENT AND PLAN / ED COURSE  Clinical Course     Pertinent labs & imaging results that were available during my care of the patient were reviewed by me and considered in my medical decision making (see chart for details).  Patient had Boxer fracture of left hand. Patient's hand was placed in ulnar gutter splint. Patient was sent home with tramadol and meloxicam for pain. Wound over middle finger is shallow and does not need to be repaired. Patient was instructed to make an appointment with ortho tomorrow for fracture.  ____________________________________________   FINAL CLINICAL IMPRESSION(S) / ED DIAGNOSES  Final diagnoses:  Closed nondisplaced fracture of fifth metacarpal bone of left hand, unspecified portion of metacarpal, initial encounter  Closed boxer's fracture, initial encounter      Laban Emperor, PA-C 09/09/16 2217    Eula Listen, MD 09/09/16 2250

## 2016-09-14 ENCOUNTER — Emergency Department
Admission: EM | Admit: 2016-09-14 | Discharge: 2016-09-14 | Disposition: A | Discharge status: Home / Self Care | Payer: Medicaid Other | Source: Home / Self Care

## 2016-09-14 ENCOUNTER — Encounter: Payer: Self-pay | Admitting: Emergency Medicine

## 2016-09-14 ENCOUNTER — Emergency Department
Admission: EM | Admit: 2016-09-14 | Discharge: 2016-09-14 | Disposition: A | Discharge status: Home / Self Care | Payer: Medicaid Other | Attending: Emergency Medicine | Admitting: Emergency Medicine

## 2016-09-14 DIAGNOSIS — Z5321 Procedure and treatment not carried out due to patient leaving prior to being seen by health care provider: Secondary | ICD-10-CM | POA: Insufficient documentation

## 2016-09-14 DIAGNOSIS — R05 Cough: Secondary | ICD-10-CM | POA: Insufficient documentation

## 2016-09-14 DIAGNOSIS — Z79899 Other long term (current) drug therapy: Secondary | ICD-10-CM | POA: Insufficient documentation

## 2016-09-14 DIAGNOSIS — Z791 Long term (current) use of non-steroidal anti-inflammatories (NSAID): Secondary | ICD-10-CM

## 2016-09-14 DIAGNOSIS — E119 Type 2 diabetes mellitus without complications: Secondary | ICD-10-CM

## 2016-09-14 DIAGNOSIS — Z794 Long term (current) use of insulin: Secondary | ICD-10-CM

## 2016-09-14 DIAGNOSIS — J111 Influenza due to unidentified influenza virus with other respiratory manifestations: Secondary | ICD-10-CM

## 2016-09-14 DIAGNOSIS — F17210 Nicotine dependence, cigarettes, uncomplicated: Secondary | ICD-10-CM | POA: Insufficient documentation

## 2016-09-14 DIAGNOSIS — E109 Type 1 diabetes mellitus without complications: Secondary | ICD-10-CM | POA: Diagnosis not present

## 2016-09-14 DIAGNOSIS — R509 Fever, unspecified: Secondary | ICD-10-CM | POA: Diagnosis present

## 2016-09-14 LAB — POCT RAPID STREP A: STREPTOCOCCUS, GROUP A SCREEN (DIRECT): NEGATIVE

## 2016-09-14 LAB — INFLUENZA PANEL BY PCR (TYPE A & B)
INFLAPCR: POSITIVE — AB
INFLBPCR: NEGATIVE

## 2016-09-14 MED ORDER — OSELTAMIVIR PHOSPHATE 75 MG PO CAPS: 75.0000 mg | ORAL_CAPSULE | Freq: Two times a day (BID) | ORAL | 0 refills | Status: AC

## 2016-09-14 MED ORDER — OSELTAMIVIR PHOSPHATE 75 MG PO CAPS
75.0000 mg | ORAL_CAPSULE | Freq: Once | ORAL | Status: AC
  Administered 2016-09-14: 75 mg via ORAL
  Filled 2016-09-14: qty 1

## 2016-09-14 MED ORDER — ACETAMINOPHEN 500 MG PO TABS
1000.0000 mg | ORAL_TABLET | Freq: Once | ORAL | Status: AC
  Administered 2016-09-14: 1000 mg via ORAL
  Filled 2016-09-14: qty 2

## 2016-09-14 MED ORDER — IBUPROFEN 800 MG PO TABS
800.0000 mg | ORAL_TABLET | Freq: Once | ORAL | Status: AC
  Administered 2016-09-14: 800 mg via ORAL
  Filled 2016-09-14: qty 1

## 2016-09-14 MED ORDER — IBUPROFEN 800 MG PO TABS
800.0000 mg | ORAL_TABLET | Freq: Once | ORAL | Status: AC
  Administered 2016-09-14: 800 mg via ORAL

## 2016-09-14 MED ORDER — IBUPROFEN 800 MG PO TABS
ORAL_TABLET | ORAL | Status: AC
  Filled 2016-09-14: qty 1

## 2016-09-14 NOTE — ED Triage Notes (Signed)
Pt states that he has been experiencing flu-like symptoms for approximately 2 days. Pt is ambulatory to triage and febrile at 103.3 but with NAD otherwise noted.

## 2016-09-14 NOTE — Discharge Instructions (Signed)
Please take medications as prescribed, drink lots of fluids. Return to the ER for any worsening symptoms or urgent changes in her health.

## 2016-09-14 NOTE — ED Triage Notes (Signed)
Pt reports 2 days of fever, nonproductive cough, sore throat, general body aches

## 2016-09-14 NOTE — ED Notes (Signed)
Patient to the ED because he believes he has the flu. States fever at home to 103.0

## 2016-09-14 NOTE — ED Provider Notes (Signed)
[facility] Provider Note   CSN: [id number] Arrival date & time: 09/14/16  2030     History   Chief Complaint Chief Complaint  Patient presents with  . Influenza    HPI Wesley [name] is a 20 y.o. male presents to the [facility] for evaluation of flulike symptoms. Patient states 2 days ago he woke up with headache, body aches, chills, cough, congestion and rhinorrhea. No nausea vomiting or diarrhea. He's had fevers up to 103. He has not taken any medications today for his fevers. He currently denies any headache or neck pain. He is tolerating by mouth fluids but not eating as much. He denies any chest pain, shortness of breath.  HPI  Past Medical History:  Diagnosis Date  . Diabetes mellitus without complication ([place])    type 1 diabetes    Patient Active Problem List   Diagnosis Date Noted  . DKA (diabetic ketoacidoses) ([place]) 09/01/2016  . Sepsis ([place]) 10/02/2015  . Scrotal abscess   . Diabetes mellitus with ketoacidosis 08/13/2012  . Type 1 diabetes mellitus ([place]) 02/06/2012    Past Surgical History:  Procedure Laterality Date  . HAND SURGERY    . INCISION AND DRAINAGE ABSCESS N/A 10/02/2015   Procedure: INCISION AND DRAINAGE ABSCESS;  Surgeon: Festus Aloe, MD;  Location: [facility] ORS;  Service: Urology;  Laterality: N/A;  . TONSILLECTOMY    . TONSILLECTOMY AND ADENOIDECTOMY    . UMBILICAL HERNIA REPAIR         Home Medications    Prior to Admission medications   Medication Sig Start Date End Date Taking? Authorizing Provider  blood glucose meter kit and supplies KIT Dispense based on patient and insurance preference. Use up to four times daily as directed. (FOR ICD-9 250.00, 250.01). 09/03/16   Lytle Butte, MD  insulin aspart (NOVOLOG) 100 UNIT/ML injection Inject 5-10 Units into the skin 3 (three) times daily before meals. Per sliding scale     Historical Provider, MD  insulin glargine (LANTUS) 100 UNIT/ML injection Inject 0.28 mLs (28  Units total) into the skin at bedtime. 09/03/16   Lytle Butte, MD  meloxicam (MOBIC) 15 MG tablet Take 1 tablet (15 mg total) by mouth daily. 09/09/16 09/09/17  Laban Emperor, PA-C  oseltamivir (TAMIFLU) 75 MG capsule Take 1 capsule (75 mg total) by mouth 2 (two) times daily. X 5 days 09/14/16 09/19/16  Duanne Guess, PA-C  traMADol (ULTRAM) 50 MG tablet Take 1 tablet (50 mg total) by mouth every 6 (six) hours as needed. 09/09/16 09/09/17  Laban Emperor, PA-C    Family History Family History  Problem Relation Age of Onset  . Diabetes Mellitus II Paternal Grandfather     Social History Social History  Substance Use Topics  . Smoking status: Current Some Day Smoker    Packs/day: 0.50    Types: Cigarettes  . Smokeless tobacco: Never Used  . Alcohol use Yes     Comment: occasional     Allergies   Patient has no known allergies.   Review of Systems Review of Systems  Constitutional: Positive for fatigue and fever. Negative for chills.  HENT: Positive for congestion, rhinorrhea and sneezing. Negative for ear pain and sore throat.   Eyes: Negative for pain and visual disturbance.  Respiratory: Positive for cough (nonproductive). Negative for shortness of breath and wheezing.   Cardiovascular: Negative for chest pain and palpitations.  Gastrointestinal: Negative for abdominal pain, diarrhea, nausea and vomiting.  Genitourinary: Negative for dysuria, flank pain  and hematuria.  Musculoskeletal: Negative for arthralgias and back pain.  Skin: Negative for color change and rash.  Neurological: Negative for dizziness, seizures, syncope and headaches.  All other systems reviewed and are negative.    Physical Exam Updated Vital Signs BP 125/79 (BP Location: Left Arm)   Pulse (!) 103   Temp (!) 103.3 F (39.6 C) (Oral)   Resp 18   Ht _0  (1.753 m)   Wt 79.4 kg   SpO2 98%   BMI 25.84 kg/m   Physical Exam  Constitutional: He appears well-developed and well-nourished.    HENT:  Head: Normocephalic and atraumatic.  Right Ear: External ear normal.  Left Ear: External ear normal.  Nose: Nose normal.  Mouth/Throat: Oropharynx is clear and moist. No oropharyngeal exudate.  Eyes: Conjunctivae and EOM are normal. Right eye exhibits no discharge. Left eye exhibits no discharge.  Neck: Normal range of motion. Neck supple.  Cardiovascular: Normal rate, regular rhythm, normal heart sounds and intact distal pulses.   No murmur heard. Pulmonary/Chest: Effort normal and breath sounds normal. No respiratory distress. He has no wheezes. He has no rales. He exhibits no tenderness.  Abdominal: Soft. He exhibits no distension and no mass. There is no tenderness. There is no rebound and no guarding. No hernia.  Musculoskeletal: He exhibits no edema.  Lymphadenopathy:    He has cervical adenopathy (posterior cervical).  Neurological: He is alert.  Skin: Skin is warm and dry.  Psychiatric: He has a normal mood and affect.  Nursing note and vitals reviewed.    ED Treatments / Results  Labs (all labs ordered are listed, but only abnormal results are displayed) Labs Reviewed - No data to display  EKG  EKG Interpretation None       Radiology No results found.  Procedures Procedures (including critical care time)  Medications Ordered in ED Medications  acetaminophen (TYLENOL) tablet 1,000 mg (not administered)  ibuprofen (ADVIL,MOTRIN) tablet 800 mg (800 mg Oral Given 09/14/16 2200)  oseltamivir (TAMIFLU) capsule 75 mg (75 mg Oral Given 09/14/16 2218)     Initial Impression / Assessment and Plan / ED Course  I have reviewed the triage vital signs and the nursing notes.  Pertinent labs & imaging results that were available during my care of the patient were reviewed by me and considered in my medical decision making (see chart for details).  Clinical Course     20 year old male with a positive influenza. We'll treat with Tamiflu. He is dosed with Tylenol  and ibuprofen, Temperature down to 99.6. He is tolerating by mouth fluids well. No signs of respiratory distress. He will continue to increase fluids. He is educated on signs and symptoms to return to the emergency department for.   Final Clinical Impressions(s) / ED Diagnoses   Final diagnoses:  Influenza  Fever, unspecified fever cause    New Prescriptions New Prescriptions   OSELTAMIVIR (TAMIFLU) 75 MG CAPSULE    Take 1 capsule (75 mg total) by mouth 2 (two) times daily. X 5 days     Duanne Guess, PA-C 09/14/16 [place], MD 09/18/16 [phone]

## 2016-09-14 NOTE — ED Notes (Signed)
Pt was here this am but did not have his insulin so decided to leave without flu swab results. Pt results are in chart at this time.

## 2017-03-17 ENCOUNTER — Inpatient Hospital Stay
Admission: EM | Admit: 2017-03-17 | Discharge: 2017-03-21 | DRG: 638 | Disposition: A | Discharge status: Home / Self Care | Payer: Medicaid Other | Attending: Internal Medicine | Admitting: Internal Medicine

## 2017-03-17 ENCOUNTER — Encounter: Payer: Self-pay | Admitting: Emergency Medicine

## 2017-03-17 DIAGNOSIS — E1043 Type 1 diabetes mellitus with diabetic autonomic (poly)neuropathy: Secondary | ICD-10-CM | POA: Diagnosis present

## 2017-03-17 DIAGNOSIS — K529 Noninfective gastroenteritis and colitis, unspecified: Secondary | ICD-10-CM

## 2017-03-17 DIAGNOSIS — F17210 Nicotine dependence, cigarettes, uncomplicated: Secondary | ICD-10-CM | POA: Diagnosis present

## 2017-03-17 DIAGNOSIS — E1010 Type 1 diabetes mellitus with ketoacidosis without coma: Principal | ICD-10-CM | POA: Diagnosis present

## 2017-03-17 DIAGNOSIS — E1110 Type 2 diabetes mellitus with ketoacidosis without coma: Secondary | ICD-10-CM | POA: Diagnosis present

## 2017-03-17 DIAGNOSIS — Z833 Family history of diabetes mellitus: Secondary | ICD-10-CM | POA: Diagnosis not present

## 2017-03-17 DIAGNOSIS — E871 Hypo-osmolality and hyponatremia: Secondary | ICD-10-CM | POA: Diagnosis present

## 2017-03-17 DIAGNOSIS — K219 Gastro-esophageal reflux disease without esophagitis: Secondary | ICD-10-CM | POA: Diagnosis present

## 2017-03-17 DIAGNOSIS — K3184 Gastroparesis: Secondary | ICD-10-CM | POA: Diagnosis present

## 2017-03-17 DIAGNOSIS — Z8249 Family history of ischemic heart disease and other diseases of the circulatory system: Secondary | ICD-10-CM

## 2017-03-17 DIAGNOSIS — R109 Unspecified abdominal pain: Secondary | ICD-10-CM

## 2017-03-17 DIAGNOSIS — Z79899 Other long term (current) drug therapy: Secondary | ICD-10-CM

## 2017-03-17 LAB — URINALYSIS, COMPLETE (UACMP) WITH MICROSCOPIC
BILIRUBIN URINE: NEGATIVE
Bacteria, UA: NONE SEEN
Glucose, UA: >=500 mg/dL — AB
HGB URINE DIPSTICK: NEGATIVE
Ketones, ur: 80 mg/dL — AB
LEUKOCYTES UA: NEGATIVE
NITRITE: NEGATIVE
PH: 5 (ref 5.0–8.0)
Protein, ur: 30 mg/dL — AB
Specific Gravity, Urine: 1.032 — ABNORMAL HIGH (ref 1.005–1.030)

## 2017-03-17 LAB — COMPREHENSIVE METABOLIC PANEL
ALK PHOS: 130 U/L — AB (ref 38–126)
ALT: 23 U/L (ref 17–63)
AST: 22 U/L (ref 15–41)
Albumin: 5.3 g/dL — ABNORMAL HIGH (ref 3.5–5.0)
Anion gap: 24 — ABNORMAL HIGH (ref 5–15)
BILIRUBIN TOTAL: 1.7 mg/dL — AB (ref 0.3–1.2)
BUN: 20 mg/dL (ref 6–20)
CALCIUM: 10.4 mg/dL — AB (ref 8.9–10.3)
CO2: 15 mmol/L — ABNORMAL LOW (ref 22–32)
CREATININE: 1.28 mg/dL — AB (ref 0.61–1.24)
Chloride: 94 mmol/L — ABNORMAL LOW (ref 101–111)
GFR calc Af Amer: >60 mL/min (ref >60)
GLUCOSE: 481 mg/dL — AB (ref 65–99)
Potassium: 4.6 mmol/L (ref 3.5–5.1)
Sodium: 133 mmol/L — ABNORMAL LOW (ref 135–145)
TOTAL PROTEIN: 8.7 g/dL — AB (ref 6.5–8.1)

## 2017-03-17 LAB — BLOOD GAS, VENOUS
ACID-BASE DEFICIT: 7.1 mmol/L — AB (ref 0.0–2.0)
BICARBONATE: 17.3 mmol/L — AB (ref 20.0–28.0)
O2 SAT: 83.2 %
PCO2 VEN: 32 mmHg — AB (ref 44.0–60.0)
PH VEN: 7.34 (ref 7.250–7.430)
Patient temperature: 37
pO2, Ven: 51 mmHg — ABNORMAL HIGH (ref 32.0–45.0)

## 2017-03-17 LAB — BASIC METABOLIC PANEL
ANION GAP: 18 — AB (ref 5–15)
BUN: 18 mg/dL (ref 6–20)
CO2: 16 mmol/L — ABNORMAL LOW (ref 22–32)
Calcium: 10 mg/dL (ref 8.9–10.3)
Chloride: 104 mmol/L (ref 101–111)
Creatinine, Ser: 1.28 mg/dL — ABNORMAL HIGH (ref 0.61–1.24)
GFR calc Af Amer: >60 mL/min (ref >60)
GLUCOSE: 268 mg/dL — AB (ref 65–99)
POTASSIUM: 4.4 mmol/L (ref 3.5–5.1)
Sodium: 138 mmol/L (ref 135–145)

## 2017-03-17 LAB — CBC
HCT: 49 % (ref 40.0–52.0)
Hemoglobin: 17.1 g/dL (ref 13.0–18.0)
MCH: 29.7 pg (ref 26.0–34.0)
MCHC: 34.8 g/dL (ref 32.0–36.0)
MCV: 85.5 fL (ref 80.0–100.0)
PLATELETS: 312 10*3/uL (ref 150–440)
RBC: 5.74 MIL/uL (ref 4.40–5.90)
RDW: 14.3 % (ref 11.5–14.5)
WBC: 6.3 10*3/uL (ref 3.8–10.6)

## 2017-03-17 LAB — LIPASE, BLOOD: Lipase: 15 U/L (ref 11–51)

## 2017-03-17 LAB — GLUCOSE, CAPILLARY
GLUCOSE-CAPILLARY: 207 mg/dL — AB (ref 65–99)
GLUCOSE-CAPILLARY: 241 mg/dL — AB (ref 65–99)
Glucose-Capillary: 414 mg/dL — ABNORMAL HIGH (ref 65–99)
Glucose-Capillary: 385 mg/dL — ABNORMAL HIGH (ref 65–99)
Glucose-Capillary: 283 mg/dL — ABNORMAL HIGH (ref 65–99)

## 2017-03-17 LAB — MRSA PCR SCREENING: MRSA BY PCR: NEGATIVE

## 2017-03-17 MED ORDER — SODIUM CHLORIDE 0.9 % IV BOLUS (SEPSIS)
1000.0000 mL | Freq: Once | INTRAVENOUS | Status: AC
  Administered 2017-03-17: 1000 mL via INTRAVENOUS

## 2017-03-17 MED ORDER — ONDANSETRON HCL 4 MG/2ML IJ SOLN
4.0000 mg | Freq: Four times a day (QID) | INTRAMUSCULAR | Status: DC | PRN
  Administered 2017-03-17 – 2017-03-18 (×3): 4 mg via INTRAVENOUS
  Filled 2017-03-17 (×2): qty 2

## 2017-03-17 MED ORDER — ONDANSETRON HCL 4 MG/2ML IJ SOLN
INTRAMUSCULAR | Status: AC
  Filled 2017-03-17: qty 2

## 2017-03-17 MED ORDER — PNEUMOCOCCAL VAC POLYVALENT 25 MCG/0.5ML IJ INJ: 0.5000 mL | INJECTION | INTRAMUSCULAR | Status: DC

## 2017-03-17 MED ORDER — KETOROLAC TROMETHAMINE 15 MG/ML IJ SOLN
15.0000 mg | Freq: Four times a day (QID) | INTRAMUSCULAR | Status: DC | PRN
  Administered 2017-03-19 – 2017-03-20 (×4): 15 mg via INTRAVENOUS
  Filled 2017-03-17 (×6): qty 1

## 2017-03-17 MED ORDER — ACETAMINOPHEN 325 MG PO TABS: 650.0000 mg | ORAL_TABLET | Freq: Four times a day (QID) | ORAL | Status: DC | PRN

## 2017-03-17 MED ORDER — OXYCODONE HCL 5 MG PO TABS
5.0000 mg | ORAL_TABLET | ORAL | Status: DC | PRN
  Administered 2017-03-18 – 2017-03-19 (×2): 5 mg via ORAL
  Filled 2017-03-17 (×3): qty 1

## 2017-03-17 MED ORDER — SODIUM CHLORIDE 0.9 % IV SOLN
INTRAVENOUS | Status: DC
  Administered 2017-03-17: 3.5 [IU]/h via INTRAVENOUS
  Filled 2017-03-17: qty 1

## 2017-03-17 MED ORDER — ENOXAPARIN SODIUM 40 MG/0.4ML ~~LOC~~ SOLN
40.0000 mg | SUBCUTANEOUS | Status: DC
  Administered 2017-03-17 – 2017-03-20 (×4): 40 mg via SUBCUTANEOUS
  Filled 2017-03-17 (×4): qty 0.4

## 2017-03-17 MED ORDER — ONDANSETRON HCL 4 MG/2ML IJ SOLN
4.0000 mg | Freq: Four times a day (QID) | INTRAMUSCULAR | Status: DC
  Filled 2017-03-17: qty 2

## 2017-03-17 MED ORDER — METOCLOPRAMIDE HCL 5 MG/ML IJ SOLN
10.0000 mg | Freq: Once | INTRAMUSCULAR | Status: AC
  Administered 2017-03-17: 10 mg via INTRAVENOUS

## 2017-03-17 MED ORDER — METOCLOPRAMIDE HCL 5 MG/ML IJ SOLN
INTRAMUSCULAR | Status: AC
  Filled 2017-03-17: qty 2

## 2017-03-17 MED ORDER — SODIUM CHLORIDE 0.9 % IV SOLN: INTRAVENOUS | Status: DC

## 2017-03-17 MED ORDER — PROMETHAZINE HCL 25 MG/ML IJ SOLN
25.0000 mg | Freq: Four times a day (QID) | INTRAMUSCULAR | Status: DC | PRN
  Administered 2017-03-17 – 2017-03-20 (×7): 25 mg via INTRAVENOUS
  Filled 2017-03-17 (×8): qty 1

## 2017-03-17 MED ORDER — ACETAMINOPHEN 650 MG RE SUPP: 650.0000 mg | Freq: Four times a day (QID) | RECTAL | Status: DC | PRN

## 2017-03-17 MED ORDER — ACETAMINOPHEN 500 MG PO TABS
1000.0000 mg | ORAL_TABLET | Freq: Once | ORAL | Status: DC
  Filled 2017-03-17: qty 2

## 2017-03-17 MED ORDER — DEXTROSE-NACL 5-0.45 % IV SOLN
INTRAVENOUS | Status: DC
  Administered 2017-03-17: 23:00:00 via INTRAVENOUS

## 2017-03-17 MED ORDER — ONDANSETRON HCL 4 MG/2ML IJ SOLN
4.0000 mg | Freq: Once | INTRAMUSCULAR | Status: AC
  Administered 2017-03-17: 4 mg via INTRAVENOUS

## 2017-03-17 NOTE — Progress Notes (Signed)
Name: Wesley [name] MRN: [id number] DOB: 12/17/1995    ADMISSION DATE:  03/17/2017  REFERRING MD :  Dr. Tressia Miners  CHIEF COMPLAINT:  DKA  BRIEF PATIENT DESCRIPTION: 21 year old with DKA  SIGNIFICANT EVENTS : Patient admitted to the ICU with DKA ON insulin gtt  STUDIES: None  HISTORY OF PRESENT ILLNESS: Wesley [name] is a 21 year old male with with type 1 DM with poor control on his blood sugar.  Patient  Presents to ED on 6/5 with abdominal pain ,nausea and vomiting.  His blood glucose was elevated 436m /dl.  Patient was started on insulin gtt and was sent to the stepdown unit.  PAST MEDICAL HISTORY :   has a past medical history of Diabetes mellitus without complication ([place].  has a past surgical history that includes Umbilical hernia repair; Tonsillectomy and adenoidectomy; Hand surgery; Incision and drainage abscess (N/A, 10/02/2015); and Tonsillectomy. Prior to Admission medications   Medication Sig Start Date End Date Taking? Authorizing Provider  insulin aspart (NOVOLOG) 100 UNIT/ML injection Inject 5-10 Units into the skin 3 (three) times daily before meals. Per sliding scale    Yes [provider]  insulin aspart protamine- aspart (NOVOLOG MIX 70/30) (70-30) 100 UNIT/ML injection Inject 20-40 Units into the skin 2 (two) times daily with a meal. Inject 40 units qam and 20 qhs   Yes [provider]  blood glucose meter kit and supplies KIT Dispense based on patient and insurance preference. Use up to four times daily as directed. (FOR ICD-9 250.00, 250.01). 09/03/16   Hower, DAaron Mose MD  insulin glargine (LANTUS) 100 UNIT/ML injection Inject 0.28 mLs (28 Units total) into the skin at bedtime. Patient not taking: Reported on 03/17/2017 09/03/16   Hower, DAaron Mose MD  meloxicam (MOBIC) 15 MG tablet Take 1 tablet (15 mg total) by mouth daily. Patient not taking: Reported on 03/17/2017 09/09/16 09/09/17  WLaban Emperor PA-C  traMADol (ULTRAM) 50 MG tablet Take 1  tablet (50 mg total) by mouth every 6 (six) hours as needed. Patient not taking: Reported on 03/17/2017 09/09/16 09/09/17  WLaban Emperor PA-C   No Known Allergies  FAMILY HISTORY:  family history includes Diabetes Mellitus II in his paternal grandfather; Hypertension in his mother. SOCIAL HISTORY:  reports that he has been smoking Cigarettes.  He has been smoking about 0.50 packs per day. He has never used smokeless tobacco. He reports that he drinks alcohol. He reports that he does not use drugs.  REVIEW OF SYSTEMS:   Constitutional: Negative for fever, chills, weight loss, malaise/fatigue and diaphoresis.  HENT: Negative for hearing loss, ear pain, nosebleeds, congestion, sore throat, neck pain, tinnitus and ear discharge.   Eyes: Negative for blurred vision, double vision, photophobia, pain, discharge and redness.  Respiratory: Negative for cough, hemoptysis, sputum production, shortness of breath, wheezing and stridor.   Cardiovascular: Negative for chest pain, palpitations, orthopnea, claudication, leg swelling and PND.  Gastrointestinal: Negative for heartburn, nausea, vomiting, abdominal pain, diarrhea, constipation, blood in stool and melena.  Genitourinary: Negative for dysuria, urgency, frequency, hematuria and flank pain.  Musculoskeletal: Negative for myalgias, back pain, joint pain and falls.  Skin: Negative for itching and rash.  Neurological: Negative for dizziness, tingling, tremors, sensory change, speech change, focal weakness, seizures, loss of consciousness, weakness and headaches.  Endo/Heme/Allergies: Negative for environmental allergies and polydipsia. Does not bruise/bleed easily.  SUBJECTIVE: Patient states that" he is nauseous and is not feeling good"  VITAL SIGNS: Temp:  [98.3 F (36.8  C)] 98.3 F (36.8 C) (06/05 1646) Pulse Rate:  [130] 130 (06/05 1646) Resp:  [16-22] 18 (06/05 2030) BP: (117-141)/(77-95) 117/77 (06/05 2130) SpO2:  [100 %] 100 % (06/05  1646) Weight:  [79.4 kg (175 lb)] 79.4 kg (175 lb) (06/05 1637)  PHYSICAL EXAMINATION: General:  Young male , on RA, in no acute distress Neuro:  Awake, Alert, oriented HEENT:  AT,,No jvd, Cardiovascular: S1S2,tachycardic,no m/r/g Lungs:Clear bilaterally, no wheezes, crackles, Abdomen:  Flat,soft,NT,ND Musculoskeletal:  No edema/cyanosis Skin:  Warm,dry and intact   Recent Labs Lab 03/17/17 1643  NA 133*  K 4.6  CL 94*  CO2 15*  BUN 20  CREATININE 1.28*  GLUCOSE 481*    Recent Labs Lab 03/17/17 1643  HGB 17.1  HCT 49.0  WBC 6.3  PLT 312   No results found.  ASSESSMENT / PLAN:   Diabetic Ketoacidosis  Interactable Nausea and Vomitting  Hyponatremia  AGMA  Continue Insulin gtt Continue I/V Fluids Will transition to D51/2 when blood glucose <250 Will transition when CO2>20 and AG<12 Follow BMP  PRN zofran/Phenergan    Bincy Varughese,AG-ACNP Pulmonary and [place]   03/17/2017, 10:02 PM   Merton Border, MD PCCM service Mobile [phone] Pager [phone] 03/18/2017 1:38 PM

## 2017-03-17 NOTE — H&P (Signed)
[place] [facility] [place] NAME: Wesley [name]    MR#:  [id number]  DATE OF BIRTH:  Jan 17, 1996  DATE OF ADMISSION:  03/17/2017  PRIMARY CARE PHYSICIAN: [facility]   REQUESTING/REFERRING PHYSICIAN: Dr. Rudene Re  CHIEF COMPLAINT:   Chief Complaint  Patient presents with  . Emesis    HISTORY OF PRESENT ILLNESS:  Wesley [name]  is a 21 y.o. male with a known history of Type 1 diabetes mellitus with poor control presents to hospital secondary to intractable nausea, vomiting and abdominal pain for the last couple of days. Patient has been followed by pediatric endocrinology at [facility], his A1c's have always been greater than 10. Due to lack of insurance he is currently on 70/30 insulin twice a day. He has had prior admissions to the hospital for DKA. His current symptoms started about 2 days ago with the onset of abdominal pain associated with significant nausea and vomiting. He denies any hematemesis. Complaints of diarrhea. Denies any fevers or chills. No chest pain, cough congestion or shortness of breath. He is noted to have blood glucose of 480 with anion gap greater than 20 year. He was also significantly tachycardic on presentation. He started on insulin drip at this time.  PAST MEDICAL HISTORY:   Past Medical History:  Diagnosis Date  . Diabetes mellitus without complication ([place])    type 1 diabetes    PAST SURGICAL HISTORY:   Past Surgical History:  Procedure Laterality Date  . HAND SURGERY    . INCISION AND DRAINAGE ABSCESS N/A 10/02/2015   Procedure: INCISION AND DRAINAGE ABSCESS;  Surgeon: Festus Aloe, MD;  Location: [facility] ORS;  Service: Urology;  Laterality: N/A;  . TONSILLECTOMY    . TONSILLECTOMY AND ADENOIDECTOMY    . UMBILICAL HERNIA REPAIR      SOCIAL HISTORY:   Social History  Substance Use Topics  . Smoking status: Current Some Day Smoker    Packs/day: 0.50    Types:  Cigarettes  . Smokeless tobacco: Never Used  . Alcohol use Yes     Comment: occasional    FAMILY HISTORY:   Family History  Problem Relation Age of Onset  . Diabetes Mellitus II Paternal Grandfather   . Hypertension Mother     DRUG ALLERGIES:  No Known Allergies  REVIEW OF SYSTEMS:   Review of Systems  Constitutional: Positive for malaise/fatigue. Negative for chills, fever and weight loss.  HENT: Negative for ear discharge, ear pain, hearing loss and nosebleeds.   Eyes: Negative for blurred vision, double vision and photophobia.  Respiratory: Negative for cough, hemoptysis, shortness of breath and wheezing.   Cardiovascular: Negative for chest pain, palpitations, orthopnea and leg swelling.  Gastrointestinal: Positive for abdominal pain, diarrhea, nausea and vomiting. Negative for constipation, heartburn and melena.  Genitourinary: Negative for dysuria, frequency, hematuria and urgency.  Musculoskeletal: Negative for back pain, myalgias and neck pain.  Skin: Negative for rash.  Neurological: Negative for dizziness, tingling, tremors, sensory change, speech change, focal weakness and headaches.  Endo/Heme/Allergies: Does not bruise/bleed easily.  Psychiatric/Behavioral: Negative for depression.    MEDICATIONS AT HOME:   Prior to Admission medications   Medication Sig Start Date End Date Taking? Authorizing Provider  insulin aspart (NOVOLOG) 100 UNIT/ML injection Inject 5-10 Units into the skin 3 (three) times daily before meals. Per sliding scale    Yes [provider]  insulin aspart protamine- aspart (NOVOLOG MIX 70/30) (70-30) 100 UNIT/ML injection Inject  20-40 Units into the skin 2 (two) times daily with a meal. Inject 40 units qam and 20 qhs   Yes [provider]  blood glucose meter kit and supplies KIT Dispense based on patient and insurance preference. Use up to four times daily as directed. (FOR ICD-9 250.00, 250.01). 09/03/16   Hower, Aaron Mose, MD    insulin glargine (LANTUS) 100 UNIT/ML injection Inject 0.28 mLs (28 Units total) into the skin at bedtime. Patient not taking: Reported on 03/17/2017 09/03/16   Hower, Aaron Mose, MD  meloxicam (MOBIC) 15 MG tablet Take 1 tablet (15 mg total) by mouth daily. Patient not taking: Reported on 03/17/2017 09/09/16 09/09/17  Laban Emperor, PA-C  traMADol (ULTRAM) 50 MG tablet Take 1 tablet (50 mg total) by mouth every 6 (six) hours as needed. Patient not taking: Reported on 03/17/2017 09/09/16 09/09/17  Laban Emperor, PA-C      VITAL SIGNS:  Blood pressure 128/79, pulse (!) 130, temperature 98.3 F (36.8 C), temperature source Oral, resp. rate 20, height '5\' 9"'  (1.753 m), weight 79.4 kg (175 lb), SpO2 100 %.  PHYSICAL EXAMINATION:   Physical Exam  GENERAL:  21 y.o.-year-old patient lying in the bed with no acute distress.  EYES: Pupils equal, round, reactive to light and accommodation. No scleral icterus. Extraocular muscles intact.  HEENT: Head atraumatic, normocephalic. Oropharynx and nasopharynx clear. Dry mucous membranes noted. NECK:  Supple, no jugular venous distention. No thyroid enlargement, no tenderness.  LUNGS: Normal breath sounds bilaterally, no wheezing, rales,rhonchi or crepitation. No use of accessory muscles of respiration.  CARDIOVASCULAR: S1, S2 normal but elevated heart rate. No murmurs, rubs, or gallops.  ABDOMEN: Soft, tender in the epigastric area, nondistended. Bowel sounds present. No organomegaly or mass.  EXTREMITIES: No pedal edema, cyanosis, or clubbing.  NEUROLOGIC: Cranial nerves II through XII are intact. Muscle strength 5/5 in all extremities. Sensation intact. Gait not checked.  PSYCHIATRIC: The patient is alert and oriented x 3.  SKIN: No obvious rash, lesion, or ulcer.   LABORATORY PANEL:   CBC  Recent Labs Lab 03/17/17 1643  WBC 6.3  HGB 17.1  HCT 49.0  PLT 312    ------------------------------------------------------------------------------------------------------------------  Chemistries   Recent Labs Lab 03/17/17 1643  NA 133*  K 4.6  CL 94*  CO2 15*  GLUCOSE 481*  BUN 20  CREATININE 1.28*  CALCIUM 10.4*  AST 22  ALT 23  ALKPHOS 130*  BILITOT 1.7*   ------------------------------------------------------------------------------------------------------------------  Cardiac Enzymes No results for input(s): TROPONINI in the last 168 hours. ------------------------------------------------------------------------------------------------------------------  RADIOLOGY:  No results found.  EKG:  No orders found for this or any previous visit.  IMPRESSION AND PLAN:   Wesley [name]  is a 21 y.o. male with a known history of Type 1 diabetes mellitus with poor control presents to hospital secondary to intractable nausea, vomiting and abdominal pain for the last couple of days.  #1 diabetic ketoacidosis-with uncontrolled type 1 diabetes mellitus -Admit to stepdown, A1c is pending. -Currently on IV fluids and insulin drip. -Continue to follow up with endocrinology as outpatient. Adjust insulin doses at the time of discharge.  #2hyponatremia- pseudo-hyponatremia secondary to elevated sugars. Continue IV fluids and monitor.  #3 intractable nausea and vomiting-IV fluids, Zofran scheduled and PRN Phenergan and monitor.  #4 DVT Prophylaxis- lovenox   All the records are reviewed and case discussed with ED provider. Management plans discussed with the patient, family and they are in agreement.  CODE STATUS: Full code  TOTAL TIME TAKING  CARE OF THIS PATIENT: 50 minutes.    Wesley [name] M.D on 03/17/2017 at 8:17 PM  Between 7am to 6pm - Pager - [phone]  After 6pm go to [email or web address] - password [email or web address] [place] [facility]  Office  [phone]  CC: Primary care physician; [facility]

## 2017-03-17 NOTE — ED Notes (Signed)
Verified change in insulin dose  with Bennetta LaosStephen RN

## 2017-03-17 NOTE — ED Provider Notes (Signed)
[facility] Emergency Department Provider Note  ____________________________________________  Time seen: Approximately 5:11 PM  I have reviewed the triage vital signs and the nursing notes.   HISTORY  Chief Complaint Emesis   HPI Wesley [name] is a 21 y.o. male with a history of type 1 diabetes who presents for evaluation of vomiting and diarrhea. Patient reports 3 days of several daily episodes of watery diarrhea. That has resolved. Over the course of the last 24 hours patient started having vomiting. Has had several episodes of nonbloody nonbilious emesis. He reports that he is unable to keep anything down. His sugars have been well controlled at home. No fever or chills, no dysuria or hematuria, no chest pain, shortness of breath, or URI symptoms. Patient does endorse diffuse cramping abdominal pain that has been present for the last 2 days. The pain is nonradiating. Patient reports one prior abdominal surgery that consisted of a repair of umbilical hernia. No other surgeries.  Past Medical History:  Diagnosis Date  . Diabetes mellitus without complication ([place])    type 1 diabetes    Patient Active Problem List   Diagnosis Date Noted  . DKA (diabetic ketoacidoses) ([place]) 09/01/2016  . Sepsis ([place]) 10/02/2015  . Scrotal abscess   . Diabetes mellitus with ketoacidosis ([place]) 08/13/2012  . Type 1 diabetes mellitus ([place]) 02/06/2012    Past Surgical History:  Procedure Laterality Date  . HAND SURGERY    . INCISION AND DRAINAGE ABSCESS N/A 10/02/2015   Procedure: INCISION AND DRAINAGE ABSCESS;  Surgeon: Festus Aloe, MD;  Location: [facility] ORS;  Service: Urology;  Laterality: N/A;  . TONSILLECTOMY    . TONSILLECTOMY AND ADENOIDECTOMY    . UMBILICAL HERNIA REPAIR      Prior to Admission medications   Medication Sig Start Date End Date Taking? Authorizing Provider  insulin aspart (NOVOLOG) 100 UNIT/ML injection Inject 5-10 Units into the skin 3  (three) times daily before meals. Per sliding scale    Yes [provider]  insulin aspart protamine- aspart (NOVOLOG MIX 70/30) (70-30) 100 UNIT/ML injection Inject 20-40 Units into the skin 2 (two) times daily with a meal. Inject 40 units qam and 20 qhs   Yes [provider]  blood glucose meter kit and supplies KIT Dispense based on patient and insurance preference. Use up to four times daily as directed. (FOR ICD-9 250.00, 250.01). 09/03/16   Hower, Aaron Mose, MD  insulin glargine (LANTUS) 100 UNIT/ML injection Inject 0.28 mLs (28 Units total) into the skin at bedtime. Patient not taking: Reported on 03/17/2017 09/03/16   Hower, Aaron Mose, MD  meloxicam (MOBIC) 15 MG tablet Take 1 tablet (15 mg total) by mouth daily. Patient not taking: Reported on 03/17/2017 09/09/16 09/09/17  Laban Emperor, PA-C  traMADol (ULTRAM) 50 MG tablet Take 1 tablet (50 mg total) by mouth every 6 (six) hours as needed. Patient not taking: Reported on 03/17/2017 09/09/16 09/09/17  Laban Emperor, PA-C    Allergies Patient has no known allergies.  Family History  Problem Relation Age of Onset  . Diabetes Mellitus II Paternal Grandfather     Social History Social History  Substance Use Topics  . Smoking status: Current Some Day Smoker    Packs/day: 0.50    Types: Cigarettes  . Smokeless tobacco: Never Used  . Alcohol use Yes     Comment: occasional    Review of Systems  Constitutional: Negative for fever. Eyes: Negative for visual changes. ENT: Negative for sore throat.  Neck: No neck pain  Cardiovascular: Negative for chest pain. Respiratory: Negative for shortness of breath. Gastrointestinal: + cramping abdominal pain, vomiting and diarrhea. Genitourinary: Negative for dysuria. Musculoskeletal: Negative for back pain. Skin: Negative for rash. Neurological: Negative for headaches, weakness or numbness. Psych: No SI or HI  ____________________________________________   PHYSICAL  EXAM:  VITAL SIGNS: ED Triage Vitals  Enc Vitals Group     BP 03/17/17 1646 128/79     Pulse Rate 03/17/17 1646 (!) 130     Resp 03/17/17 1646 20     Temp 03/17/17 1646 98.3 F (36.8 C)     Temp Source 03/17/17 1646 Oral     SpO2 03/17/17 1646 100 %     Weight 03/17/17 1637 175 lb (79.4 kg)     Height 03/17/17 1637 '5\' 9"'  (1.753 m)     Head Circumference --      Peak Flow --      Pain Score 03/17/17 1637 5     Pain Loc --      Pain Edu? --      Excl. in [place]? --     Constitutional: Alert and oriented. Well appearing and in no apparent distress. HEENT:      Head: Normocephalic and atraumatic.         Eyes: Conjunctivae are normal. Sclera is non-icteric.       Mouth/Throat: Mucous membranes are moist.       Neck: Supple with no signs of meningismus. Cardiovascular: Tachycardic with regular rhythm. No murmurs, gallops, or rubs. 2+ symmetrical distal pulses are present in all extremities. No JVD. Respiratory: Normal respiratory effort. Lungs are clear to auscultation bilaterally. No wheezes, crackles, or rhonchi.  Gastrointestinal: Soft, non tender, and non distended with positive bowel sounds. No rebound or guarding. Musculoskeletal: Nontender with normal range of motion in all extremities. No edema, cyanosis, or erythema of extremities. Neurologic: Normal speech and language. Face is symmetric. Moving all extremities. No gross focal neurologic deficits are appreciated. Skin: Skin is warm, dry and intact. No rash noted. Psychiatric: Mood and affect are normal. Speech and behavior are normal.  ____________________________________________   LABS (all labs ordered are listed, but only abnormal results are displayed)  Labs Reviewed  COMPREHENSIVE METABOLIC PANEL - Abnormal; Notable for the following:       Result Value   Sodium 133 (*)    Chloride 94 (*)    CO2 15 (*)    Glucose, Bld 481 (*)    Creatinine, Ser 1.28 (*)    Calcium 10.4 (*)    Total Protein 8.7 (*)    Albumin  5.3 (*)    Alkaline Phosphatase 130 (*)    Total Bilirubin 1.7 (*)    Anion gap 24 (*)    All other components within normal limits  URINALYSIS, COMPLETE (UACMP) WITH MICROSCOPIC - Abnormal; Notable for the following:    Color, Urine YELLOW (*)    APPearance CLEAR (*)    Specific Gravity, Urine 1.032 (*)    Glucose, UA >=500 (*)    Ketones, ur 80 (*)    Protein, ur 30 (*)    Squamous Epithelial / LPF 0-5 (*)    All other components within normal limits  BLOOD GAS, VENOUS - Abnormal; Notable for the following:    pCO2, Ven 32 (*)    pO2, Ven 51.0 (*)    Bicarbonate 17.3 (*)    Acid-base deficit 7.1 (*)    All other components within normal limits  LIPASE, BLOOD  CBC  CBG MONITORING, ED   ____________________________________________  EKG  none  ____________________________________________  RADIOLOGY  none  ____________________________________________   PROCEDURES  Procedure(s) performed: None Procedures Critical Care performed:  Yes  CRITICAL CARE Performed by: Rudene Re  ?  Total critical care time: 35 min  Critical care time was exclusive of separately billable procedures and treating other patients.  Critical care was necessary to treat or prevent imminent or life-threatening deterioration.  Critical care was time spent personally by me on the following activities: development of treatment plan with patient and/or surrogate as well as nursing, discussions with consultants, evaluation of patient's response to treatment, examination of patient, obtaining history from patient or surrogate, ordering and performing treatments and interventions, ordering and review of laboratory studies, ordering and review of radiographic studies, pulse oximetry and re-evaluation of patient's condition.  ____________________________________________   INITIAL IMPRESSION / ASSESSMENT AND PLAN / ED COURSE   21 y.o. male with a history of type 1 diabetes who presents for  evaluation of vomiting and diarrhea. Patient is well-appearing, he is tachycardic with heart rate of 1:30, afebrile, abdomen is soft with no tenderness throughout. Presentation concerning for viral gastroenteritis. We'll check for signs of DKA. We'll give IV fluids and IV Zofran.    _________________________ 7:22 PM on 03/17/2017 -----------------------------------------  Labs concerning for mild DKA with 80 ketones in his urine, bicarbonate of 15, glucose of 481, AG 24. Normal pH. Patient given 2L NS with improvement of HR. Continues to be nauseated. Insulin drip ordered. Patient will be admitted to the hospitalist  Pertinent labs & imaging results that were available during my care of the patient were reviewed by me and considered in my medical decision making (see chart for details).    ____________________________________________   FINAL CLINICAL IMPRESSION(S) / ED DIAGNOSES  Final diagnoses:  Diabetic ketoacidosis without coma associated with type 1 diabetes mellitus (HCC)  Gastroenteritis      NEW MEDICATIONS STARTED DURING THIS VISIT:  New Prescriptions   No medications on file     Note:  This document was prepared using Dragon voice recognition software and may include unintentional dictation errors.    Rudene Re, MD 03/17/17 [phone]

## 2017-03-17 NOTE — ED Notes (Signed)
Patient did not want to take tylenol right now since he just vomited some. EDP aware.

## 2017-03-17 NOTE — ED Triage Notes (Signed)
Vomiting x 1 day.  Also some diarrhea.

## 2017-03-18 LAB — GLUCOSE, CAPILLARY
GLUCOSE-CAPILLARY: 119 mg/dL — AB (ref 65–99)
GLUCOSE-CAPILLARY: 221 mg/dL — AB (ref 65–99)
GLUCOSE-CAPILLARY: 198 mg/dL — AB (ref 65–99)
Glucose-Capillary: 170 mg/dL — ABNORMAL HIGH (ref 65–99)
Glucose-Capillary: 146 mg/dL — ABNORMAL HIGH (ref 65–99)
Glucose-Capillary: 144 mg/dL — ABNORMAL HIGH (ref 65–99)
Glucose-Capillary: 168 mg/dL — ABNORMAL HIGH (ref 65–99)
Glucose-Capillary: 114 mg/dL — ABNORMAL HIGH (ref 65–99)
Glucose-Capillary: 272 mg/dL — ABNORMAL HIGH (ref 65–99)

## 2017-03-18 LAB — BASIC METABOLIC PANEL
ANION GAP: 12 (ref 5–15)
Anion gap: 15 (ref 5–15)
BUN: 14 mg/dL (ref 6–20)
BUN: 13 mg/dL (ref 6–20)
CALCIUM: 9.8 mg/dL (ref 8.9–10.3)
CHLORIDE: 105 mmol/L (ref 101–111)
CO2: 19 mmol/L — AB (ref 22–32)
CO2: 19 mmol/L — AB (ref 22–32)
CREATININE: 1.03 mg/dL (ref 0.61–1.24)
Calcium: 9.8 mg/dL (ref 8.9–10.3)
Chloride: 107 mmol/L (ref 101–111)
Creatinine, Ser: 1.06 mg/dL (ref 0.61–1.24)
GFR calc Af Amer: >60 mL/min (ref >60)
GFR calc Af Amer: >60 mL/min (ref >60)
GFR calc non Af Amer: >60 mL/min (ref >60)
GFR calc non Af Amer: >60 mL/min (ref >60)
GLUCOSE: 168 mg/dL — AB (ref 65–99)
Glucose, Bld: 165 mg/dL — ABNORMAL HIGH (ref 65–99)
Potassium: 4.1 mmol/L (ref 3.5–5.1)
Potassium: 4.4 mmol/L (ref 3.5–5.1)
SODIUM: 139 mmol/L (ref 135–145)
Sodium: 138 mmol/L (ref 135–145)

## 2017-03-18 LAB — CBC
HCT: 47.8 % (ref 40.0–52.0)
HEMOGLOBIN: 16.7 g/dL (ref 13.0–18.0)
MCH: 29.7 pg (ref 26.0–34.0)
MCHC: 34.9 g/dL (ref 32.0–36.0)
MCV: 84.9 fL (ref 80.0–100.0)
Platelets: 276 10*3/uL (ref 150–440)
RBC: 5.63 MIL/uL (ref 4.40–5.90)
RDW: 14.7 % — ABNORMAL HIGH (ref 11.5–14.5)
WBC: 8 10*3/uL (ref 3.8–10.6)

## 2017-03-18 MED ORDER — ONDANSETRON HCL 4 MG/2ML IJ SOLN: 4.0000 mg | INTRAMUSCULAR | Status: DC

## 2017-03-18 MED ORDER — INSULIN ASPART 100 UNIT/ML ~~LOC~~ SOLN
0.0000 [IU] | Freq: Three times a day (TID) | SUBCUTANEOUS | Status: DC
Start: 2017-03-18 — End: 2017-03-21
  Administered 2017-03-18: 5 [IU] via SUBCUTANEOUS
  Administered 2017-03-18: 8 [IU] via SUBCUTANEOUS
  Administered 2017-03-19 (×2): 3 [IU] via SUBCUTANEOUS
  Administered 2017-03-19: 8 [IU] via SUBCUTANEOUS
  Administered 2017-03-20: 17:00:00 11 [IU] via SUBCUTANEOUS
  Administered 2017-03-20: 09:00:00 15 [IU] via SUBCUTANEOUS
  Administered 2017-03-20 – 2017-03-21 (×2): 5 [IU] via SUBCUTANEOUS
  Administered 2017-03-21: 08:00:00 2 [IU] via SUBCUTANEOUS
  Filled 2017-03-18: qty 3
  Filled 2017-03-18: qty 8
  Filled 2017-03-18: qty 5
  Filled 2017-03-18: qty 8
  Filled 2017-03-18: qty 2
  Filled 2017-03-18: qty 11
  Filled 2017-03-18: qty 5
  Filled 2017-03-18: qty 15
  Filled 2017-03-18: qty 5
  Filled 2017-03-18: qty 3

## 2017-03-18 MED ORDER — SODIUM CHLORIDE 0.9 % IV SOLN: INTRAVENOUS | Status: DC

## 2017-03-18 MED ORDER — INSULIN ASPART 100 UNIT/ML ~~LOC~~ SOLN: 0.0000 [IU] | Freq: Every day | SUBCUTANEOUS | Status: DC

## 2017-03-18 MED ORDER — ONDANSETRON HCL 4 MG/2ML IJ SOLN
4.0000 mg | INTRAMUSCULAR | Status: DC | PRN
  Administered 2017-03-18 – 2017-03-19 (×4): 4 mg via INTRAVENOUS
  Filled 2017-03-18 (×3): qty 2

## 2017-03-18 MED ORDER — INSULIN GLARGINE 100 UNIT/ML ~~LOC~~ SOLN
28.0000 [IU] | Freq: Every day | SUBCUTANEOUS | Status: DC
  Administered 2017-03-18 – 2017-03-19 (×3): 28 [IU] via SUBCUTANEOUS
  Filled 2017-03-18 (×5): qty 0.28

## 2017-03-18 NOTE — Progress Notes (Signed)
Patient is off of insulin drip. No distress. Glycemic control is adequate. I have placed transfer orders for medical-surgical floor. After transfer,PCCM will sign off. Please call if we can be of further assistance  Billy Fischeravid Marquite Attwood, MD PCCM service Mobile [phone] Pager [phone] 03/18/2017 1:38 PM

## 2017-03-18 NOTE — Progress Notes (Signed)
Inpatient Diabetes Program Recommendations  AACE/ADA: New Consensus Statement on Inpatient Glycemic Control (2015)  Target Ranges:  Prepandial:   less than 140 mg/dL      Peak postprandial:   less than 180 mg/dL (1-2 hours)      Critically ill patients:  140 - 180 mg/dL   Review of Glycemic Control  Diabetes history: DM 1 Outpatient Diabetes medications: 70/30 40 units QAM, 20 units QPM, Novolog 5-10 units tid Current orders for Inpatient glycemic control: Transitioning to Lantus 28 units, Novolog Moderate + Novolog HS scale  A1c >14% on 12/31/16 in CareEverywhere  Inpatient Diabetes Program Recommendations:    When patient is eating, please consider meal coverage since patient is a type, Novolog 4-6 units tid if patient consumes at least 50% of meals.  Thanks,  Christena DeemShannon Tonianne Fine RN, MSN, [facility] Inpatient Diabetes Coordinator Team Pager [phone] (8a-5p)

## 2017-03-18 NOTE — Progress Notes (Signed)
Pt being transferred to room 104. Report called to Duwayne Heckanielle, RN. Pt and belongings transferred to room 104 without incident.

## 2017-03-18 NOTE — Care Management (Addendum)
Accu-Chek Aviva glucometer should be covered under patient's Medicaid and so should most of his medications. Patient said he tried at [facility] for glucometer however they have no record of patient filling meds there or glucometer. New Rx for glucometer and medications needed at discharge to meet this patient's needs.  I met with patient and he states that his PCP is Dr. Marland Kitchen which is a Pediatric MD and patient states he saw MD about 4 months ago. He states that Dr. Marland Kitchen is trying to establish patient with an adult PCP to manage his diabetes. Patient states that Dr. Marland Kitchen is located between [facility] and [place]. His listed PCP does not match what patient is telling me: [facility] [phone] is listed as provider. The clinic said he was terminated in August 2017 due to pediatric requirements. There is not knowledge that patient was assigned or assisted with assignment to another provider. I've learned that Dr. Herbie Baltimore is a pediatric endocrinologist [phone] which clarifies patient's statement. RNCM spoke with Christina with Dr. Chauncey Cruel office to see if they had started the process to change patient's provider. She was not able to tell me anything- message left for physician's nurse to call this RNCM back. RNCM contacted [facility] [phone] and they have patient's PCP listed as [place] [phone]. I spoke with Jocelyn Lamer at [facility] and they have never seen patient and patient was not aware this change had been made with Medicaid.  Appt Saturday 8:30AM; patient given address/phone and appointment.  Information about [facility] shared with patient.

## 2017-03-18 NOTE — Progress Notes (Signed)
Pt stated he vomited the oxycodone he was given; however, unable to locate same in emesis. Unable to verify that the pill was actually vomited up, will hold any additional pain medication at this time.

## 2017-03-18 NOTE — Progress Notes (Signed)
Initial Nutrition Assessment  DOCUMENTATION CODES:   Not applicable  INTERVENTION:  1. Continue to monitor for PO intake  NUTRITION DIAGNOSIS:   Inadequate oral intake related to nausea, vomiting, acute illness, poor appetite as evidenced by per patient/family report.  GOAL:   Patient will meet greater than or equal to 90% of their needs  MONITOR:   PO intake, I & O's, Labs, Weight trends, Supplement acceptance  REASON FOR ASSESSMENT:   Malnutrition Screening Tool    ASSESSMENT:   Wesley [name]  is a 21 y.o. male with a known history of Type 1 diabetes mellitus with poor control presents to hospital secondary to intractable nausea, vomiting and abdominal pain for the last couple of days.  Spoke with Wesley [name] at bedside. He reports nausea/vomiting/ab pain x3 days. Has resolved some now, continues to be nauseous - asking for Zofran. Exhibits 13#/7.4% insignificant wt loss over 6 months. Had breakfast at bedside but did not eat due to nausea. Prior to onset of DKA - patient reports eating well - no acute complaints, no weight loss, no issues chewing/swallowing/choking. States "I just want to figure out what's wrong." Briefly explained his symptoms will resolve over time. Labs and medications reviewed: Lantus 28 units, Sliding Scale Novolog  Diet Order:  Diet Carb Modified Fluid consistency: Thin; Room service appropriate? Yes  Skin:  Reviewed, no issues  Last BM:  03/17/2017  Height:   Ht Readings from Last 1 Encounters:  03/17/17 5\' 9"  (1.753 m)    Weight:   Wt Readings from Last 1 Encounters:  03/17/17 162 lb 4.1 oz (73.6 kg)    Ideal Body Weight:  72.72 kg  BMI:  Body mass index is 23.96 kg/m.  Estimated Nutritional Needs:   Kcal:  1850-2200 calories  Protein:  74-88 grams  Fluid:  >/= 1.85L  EDUCATION NEEDS:   No education needs identified at this time  Dionne AnoWilliam M. Zuleica Seith, MS, RD LDN Inpatient Clinical Dietitian Pager [phone]

## 2017-03-18 NOTE — Progress Notes (Signed)
[facility] - [place] at [facility]   PATIENT NAME: Wesley [name]    MR#:  [id number]  DATE OF BIRTH:  05/30/1996  SUBJECTIVE:  CHIEF COMPLAINT:   Chief Complaint  Patient presents with  . Emesis   Wesley [name] nausea, but no abdominal pain or vomiting or diarrhea. REVIEW OF SYSTEMS:  Review of Systems  Constitutional: Negative for chills, fever and malaise/fatigue.  HENT: Negative for sore throat.   Eyes: Negative for blurred vision and double vision.  Respiratory: Negative for cough, shortness of breath and stridor.   Cardiovascular: Negative for chest pain and leg swelling.  Gastrointestinal: Positive for nausea. Negative for blood in stool, diarrhea, melena and vomiting.  Genitourinary: Negative for dysuria and hematuria.  Musculoskeletal: Negative for back pain.  Neurological: Negative for dizziness, focal weakness, loss of consciousness and weakness.  Psychiatric/Behavioral: Negative for depression. The patient is not nervous/anxious.     DRUG ALLERGIES:  No Known Allergies VITALS:  Blood pressure (!) 158/90, pulse 89, temperature 97.5 F (36.4 C), temperature source Oral, resp. rate 16, height 5\' 9"  (1.753 m), weight 162 lb 4.1 oz (73.6 kg), SpO2 100 %. PHYSICAL EXAMINATION:  Physical Exam  Constitutional: He is oriented to person, place, and time and well-developed, well-nourished, and in no distress.  HENT:  Head: Normocephalic.  Mouth/Throat: Oropharynx is clear and moist.  Eyes: Conjunctivae and EOM are normal. No scleral icterus.  Neck: Normal range of motion. Neck supple. No JVD present. No tracheal deviation present.  Cardiovascular: Normal rate, regular rhythm and normal heart sounds.  Exam reveals no gallop.   No murmur heard. Pulmonary/Chest: Effort normal and breath sounds normal. No respiratory distress. He has no wheezes. He has no rales.  Abdominal: Soft. Bowel sounds are normal. He exhibits no distension. There is no tenderness.    Musculoskeletal: Normal range of motion. He exhibits no edema or tenderness.  Neurological: He is alert and oriented to person, place, and time. No cranial nerve deficit.  Skin: No rash noted. No erythema.  Psychiatric: Affect normal.   LABORATORY PANEL:  Male CBC  Recent Labs Lab 03/18/17 0255  WBC 8.0  HGB 16.7  HCT 47.8  PLT 276   ------------------------------------------------------------------------------------------------------------------ Chemistries   Recent Labs Lab 03/17/17 1643  03/18/17 0718  NA 133*  < > 139  K 4.6  < > 4.4  CL 94*  < > 105  CO2 15*  < > 19*  GLUCOSE 481*  < > 165*  BUN 20  < > 13  CREATININE 1.28*  < > 1.03  CALCIUM 10.4*  < > 9.8  AST 22  --   --   ALT 23  --   --   ALKPHOS 130*  --   --   BILITOT 1.7*  --   --   < > = values in this interval not displayed. RADIOLOGY:  No results found. ASSESSMENT AND PLAN:   Wesley [name]  is a 21 y.o. male with a known history of Type 1 diabetes mellitus with poor control presents to hospital secondary to intractable nausea, vomiting and abdominal pain for the last couple of days.  #1 diabetic ketoacidosis-with uncontrolled type 1 diabetes mellitus He is off insulin drip and started lantus this am. -Continue to follow up with endocrinology as outpatient. Adjust insulin doses at the time of discharge.  #2hyponatremia- pseudo-hyponatremia secondary to elevated sugars. Continue IV fluids and monitor. Improved.  #3 intractable nausea and vomiting-IV fluids, Zofran scheduled and PRN Phenergan  and monitor.  All the records are reviewed and case discussed with Care Management/Social Worker. Management plans discussed with the patient, family and they are in agreement.  CODE STATUS: Full Code  TOTAL TIME TAKING CARE OF THIS PATIENT: 32 minutes.   More than 50% of the time was spent in counseling/coordination of care: YES  POSSIBLE D/C IN 1-2 DAYS, DEPENDING ON CLINICAL  CONDITION.   Wesley Pollackhen, Wesley [name] M.D on 03/18/2017 at 3:38 PM  Between 7am to 6pm - Pager - [phone]  After 6pm go to [email or web address] - Social research officer, governmentpassword EPAS [facility]  [facility]  Office  [phone]  CC: Primary care physician; [facility]  Note: This dictation was prepared with Dragon dictation along with smaller phrase technology. Any transcriptional errors that result from this process are unintentional.

## 2017-03-19 ENCOUNTER — Inpatient Hospital Stay: Payer: Medicaid Other

## 2017-03-19 LAB — BASIC METABOLIC PANEL
ANION GAP: 13 (ref 5–15)
BUN: 14 mg/dL (ref 6–20)
CHLORIDE: 103 mmol/L (ref 101–111)
CO2: 24 mmol/L (ref 22–32)
Calcium: 10.3 mg/dL (ref 8.9–10.3)
Creatinine, Ser: 1.27 mg/dL — ABNORMAL HIGH (ref 0.61–1.24)
GFR calc non Af Amer: >60 mL/min (ref >60)
Glucose, Bld: 179 mg/dL — ABNORMAL HIGH (ref 65–99)
POTASSIUM: 4.1 mmol/L (ref 3.5–5.1)
SODIUM: 140 mmol/L (ref 135–145)

## 2017-03-19 LAB — GLUCOSE, CAPILLARY
GLUCOSE-CAPILLARY: 200 mg/dL — AB (ref 65–99)
GLUCOSE-CAPILLARY: 191 mg/dL — AB (ref 65–99)
GLUCOSE-CAPILLARY: 160 mg/dL — AB (ref 65–99)
Glucose-Capillary: 284 mg/dL — ABNORMAL HIGH (ref 65–99)

## 2017-03-19 LAB — HEMOGLOBIN A1C
HEMOGLOBIN A1C: 11.3 % — AB (ref 4.8–5.6)
Mean Plasma Glucose: 278 mg/dL

## 2017-03-19 LAB — HIV ANTIBODY (ROUTINE TESTING W REFLEX): HIV Screen 4th Generation wRfx: NONREACTIVE

## 2017-03-19 MED ORDER — PANTOPRAZOLE SODIUM 40 MG IV SOLR
40.0000 mg | Freq: Two times a day (BID) | INTRAVENOUS | Status: DC
  Administered 2017-03-19 – 2017-03-21 (×5): 40 mg via INTRAVENOUS
  Filled 2017-03-19 (×5): qty 40

## 2017-03-19 MED ORDER — METOCLOPRAMIDE HCL 5 MG/ML IJ SOLN
10.0000 mg | Freq: Four times a day (QID) | INTRAMUSCULAR | Status: DC
  Administered 2017-03-19 – 2017-03-21 (×7): 10 mg via INTRAVENOUS
  Filled 2017-03-19 (×7): qty 2

## 2017-03-19 MED ORDER — IOPAMIDOL (ISOVUE-300) INJECTION 61%
100.0000 mL | Freq: Once | INTRAVENOUS | Status: AC | PRN
  Administered 2017-03-19: 10:00:00 100 mL via INTRAVENOUS

## 2017-03-19 MED ORDER — IOPAMIDOL (ISOVUE-300) INJECTION 61%
15.0000 mL | INTRAVENOUS | Status: AC
  Administered 2017-03-19 (×2): 15 mL via ORAL

## 2017-03-19 NOTE — Progress Notes (Addendum)
[facility] - [place] at [facility]   PATIENT NAME: Wesley [name]    MR#:  [id number]  DATE OF BIRTH:  04-Apr-1996  SUBJECTIVE:  CHIEF COMPLAINT:   Chief Complaint  Patient presents with  . Emesis   - Complains of epigastric abdominal pain associated with nausea and bilious vomiting.  REVIEW OF SYSTEMS:  Review of Systems  Constitutional: Negative for chills, fever and malaise/fatigue.  HENT: Negative for ear discharge, hearing loss, nosebleeds and sore throat.   Respiratory: Negative for cough, shortness of breath and wheezing.   Cardiovascular: Negative for chest pain, palpitations and leg swelling.  Gastrointestinal: Positive for abdominal pain, nausea and vomiting. Negative for constipation and diarrhea.  Genitourinary: Negative for dysuria.  Musculoskeletal: Negative for myalgias.  Neurological: Negative for dizziness, speech change, focal weakness, seizures and headaches.  Psychiatric/Behavioral: Negative for depression.    DRUG ALLERGIES:  No Known Allergies  VITALS:  Blood pressure (!) 138/91, pulse 64, temperature 98.6 F (37 C), temperature source Oral, resp. rate 19, height 5\' 9"  (1.753 m), weight 73.6 kg (162 lb 4.1 oz), SpO2 100 %.  PHYSICAL EXAMINATION:  Physical Exam  GENERAL:  21 y.o.-year-old patient lying in the bed with no acute distress.  EYES: Pupils equal, round, reactive to light and accommodation. No scleral icterus. Extraocular muscles intact.  HEENT: Head atraumatic, normocephalic. Oropharynx and nasopharynx clear.  NECK:  Supple, no jugular venous distention. No thyroid enlargement, no tenderness.  LUNGS: Normal breath sounds bilaterally, no wheezing, rales,rhonchi or crepitation. No use of accessory muscles of respiration.  CARDIOVASCULAR: S1, S2 normal. No murmurs, rubs, or gallops.  ABDOMEN: Soft, tender in the epigastric region, nondistended. Bowel sounds present. No organomegaly or mass.  EXTREMITIES: No pedal edema,  cyanosis, or clubbing.  NEUROLOGIC: Cranial nerves II through XII are intact. Muscle strength 5/5 in all extremities. Sensation intact. Gait not checked.  PSYCHIATRIC: The patient is alert and oriented x 3.  SKIN: No obvious rash, lesion, or ulcer.    LABORATORY PANEL:   CBC  Recent Labs Lab 03/18/17 0255  WBC 8.0  HGB 16.7  HCT 47.8  PLT 276   ------------------------------------------------------------------------------------------------------------------  Chemistries   Recent Labs Lab 03/17/17 1643  03/19/17 0416  NA 133*  < > 140  K 4.6  < > 4.1  CL 94*  < > 103  CO2 15*  < > 24  GLUCOSE 481*  < > 179*  BUN 20  < > 14  CREATININE 1.28*  < > 1.27*  CALCIUM 10.4*  < > 10.3  AST 22  --   --   ALT 23  --   --   ALKPHOS 130*  --   --   BILITOT 1.7*  --   --   < > = values in this interval not displayed. ------------------------------------------------------------------------------------------------------------------  Cardiac Enzymes No results for input(s): TROPONINI in the last 168 hours. ------------------------------------------------------------------------------------------------------------------  RADIOLOGY:  Ct Abdomen Pelvis W Contrast  Result Date: 03/19/2017 CLINICAL DATA:  Abdominal pain, vomiting.  Diarrhea. EXAM: CT ABDOMEN AND PELVIS WITH CONTRAST TECHNIQUE: Multidetector CT imaging of the abdomen and pelvis was performed using the standard protocol following bolus administration of intravenous contrast. CONTRAST:  100mL ISOVUE-300 IOPAMIDOL (ISOVUE-300) INJECTION 61% COMPARISON:  12/24/2011 FINDINGS: Lower chest: Lung bases are clear. No effusions. Heart is normal size. Hepatobiliary: No focal hepatic abnormality. Gallbladder unremarkable. Pancreas: No focal abnormality or ductal dilatation. Spleen: No focal abnormality.  Normal size. Adrenals/Urinary Tract: No adrenal abnormality. No focal renal  abnormality. No stones or hydronephrosis. Urinary bladder is  unremarkable. Stomach/Bowel: Normal appendix. Stomach, large and small bowel grossly unremarkable. Vascular/Lymphatic: No evidence of aneurysm or adenopathy. Reproductive: No visible focal abnormality. Other: No free fluid or free air. Musculoskeletal: Negative IMPRESSION: No acute findings in the abdomen or pelvis. Normal appendix. Electronically Signed   By: Charlett Nose M.D.   On: 03/19/2017 10:39    EKG:  No orders found for this or any previous visit.  ASSESSMENT AND PLAN:   Nosson Wender  is a 21 y.o. male with a known history of Type 1 diabetes mellitus with poor control presents to hospital secondary to intractable nausea, vomiting and abdominal pain for the last couple of days.  #1 diabetic ketoacidosis-with uncontrolled type 1 diabetes mellitus -HbA1c is 11.3 - Continue IV fluids. Patient was on IV insulin drip in the ICU. Currently transitioned to Lantus daily and sliding scale insulin. Sugars are better controlled. - change to liquid diet and advance as tolerated  #2hyponatremia- pseudo-hyponatremia secondary to elevated sugars. Improved now  #3 intractable nausea and vomiting- CT of the abdomen ordered to rule out any obstruction, but has been negative. Patient unable to tolerate oral contrast. -Could be GERD with gastroparesis. -Started on IV Protonix twice a day, Reglan scheduled and Phenergan as needed and monitor.  #4 DVT Prophylaxis- lovenox     All the records are reviewed and case discussed with Care Management/Social Workerr. Management plans discussed with the patient, family and they are in agreement.  CODE STATUS: Full code  TOTAL TIME TAKING CARE OF THIS PATIENT: 37 minutes.   POSSIBLE D/C IN 1-2 DAYS, DEPENDING ON CLINICAL CONDITION.   Enid Baas M.D on 03/19/2017 at 12:22 PM  Between 7am to 6pm - Pager - [phone]  After 6pm go to [email or web address] - Social research officer, government  [facility]  Office  [phone]  CC: Primary  care physician; [facility]

## 2017-03-19 NOTE — Progress Notes (Signed)
Inpatient Diabetes Program Recommendations  AACE/ADA: New Consensus Statement on Inpatient Glycemic Control (2015)  Target Ranges:  Prepandial:   less than 140 mg/dL      Peak postprandial:   less than 180 mg/dL (1-2 hours)      Critically ill patients:  140 - 180 mg/dL   Lab Results  Component Value Date   GLUCAP 200 (H) 03/19/2017   HGBA1C 11.3 (H) 03/17/2017    Review of Glycemic Control Results for Wesley [name], Wesley [name] (MRN [id number]) as of 03/19/2017 08:51  Ref. Range 03/18/2017 06:08 03/18/2017 11:40 03/18/2017 16:53 03/18/2017 20:49 03/19/2017 07:27  Glucose-Capillary Latest Ref Range: 65 - 99 mg/dL 409114 (H) 811272 (H) 914221 (H) 198 (H) 200 (H)    Diabetes history: DM 1 Outpatient Diabetes medications: NPH 70/30 40 units QAM, NPH 70/30 20 units QPM, Novolog 5-10 units tid  Current orders for Inpatient glycemic control:  Lantus 28 units, Novolog Moderate + Novolog HS scale  A1c >14% on 12/31/16 in CareEverywhere  Inpatient Diabetes Program Recommendations:    When patient is eating, please consider adding meal coverage since patient has type 1 diabetes, Novolog 4 units tid if patient consumes at least 50% of meals. (current Novolog sliding scale is only meant to "correct" the high blood sugar not to cover the food he eats) once the Novolog meal coverage is added, decrease the Novolog correction to sensitive correction 0-9 units tid.   Consider increasing Lantus to 29 units q day- fasting blood sugar 200mg /dl.   Wesley RacerJulie Tiaunna Buford, RN, BA, MHA, CDE Diabetes Coordinator Inpatient Diabetes Program  [phone] (Team Pager) [phone] [facility] Office) 03/19/2017 8:54 AM

## 2017-03-19 NOTE — Progress Notes (Addendum)
Pt repeatedly complains of nausea and vomiting.  Zofran and Phenergan given.  Pt states he has vomited many times, but mostly spitting and dry heaving that comes with the nausea.  No significant emesis noted.  Severe abdominal pain (10/10) was treated with IV toradol.  Pt wants to know what is going on.

## 2017-03-20 LAB — GLUCOSE, CAPILLARY
GLUCOSE-CAPILLARY: 373 mg/dL — AB (ref 65–99)
GLUCOSE-CAPILLARY: 369 mg/dL — AB (ref 65–99)
GLUCOSE-CAPILLARY: 246 mg/dL — AB (ref 65–99)
Glucose-Capillary: 302 mg/dL — ABNORMAL HIGH (ref 65–99)
Glucose-Capillary: 142 mg/dL — ABNORMAL HIGH (ref 65–99)

## 2017-03-20 LAB — VITAMIN B12: Vitamin B-12: 1090 pg/mL — ABNORMAL HIGH (ref 180–914)

## 2017-03-20 MED ORDER — INSULIN DETEMIR 100 UNIT/ML ~~LOC~~ SOLN
21.0000 [IU] | Freq: Two times a day (BID) | SUBCUTANEOUS | Status: DC
  Administered 2017-03-20 – 2017-03-21 (×2): 21 [IU] via SUBCUTANEOUS
  Filled 2017-03-20 (×3): qty 0.21

## 2017-03-20 MED ORDER — PROCHLORPERAZINE EDISYLATE 5 MG/ML IJ SOLN: 10.0000 mg | INTRAMUSCULAR | Status: DC

## 2017-03-20 MED ORDER — PROCHLORPERAZINE EDISYLATE 5 MG/ML IJ SOLN
10.0000 mg | INTRAMUSCULAR | Status: DC | PRN
  Administered 2017-03-20: 10 mg via INTRAVENOUS
  Filled 2017-03-20 (×3): qty 2

## 2017-03-20 MED ORDER — SODIUM CHLORIDE 0.9 % IV SOLN
INTRAVENOUS | Status: DC
  Administered 2017-03-20 (×2): via INTRAVENOUS

## 2017-03-20 MED ORDER — INSULIN GLARGINE 100 UNIT/ML ~~LOC~~ SOLN
30.0000 [IU] | Freq: Every day | SUBCUTANEOUS | Status: DC
  Filled 2017-03-20: qty 0.3

## 2017-03-20 MED ORDER — ONDANSETRON HCL 40 MG/20ML IJ SOLN: 8.0000 mg | Freq: Three times a day (TID) | INTRAMUSCULAR | Status: DC

## 2017-03-20 NOTE — Plan of Care (Signed)
Problem: Fluid Volume: Goal: Ability to maintain a balanced intake and output will improve Outcome: Progressing Pt vomited once in am. Scheduled reglan given for nausea. Compazine given once with improvement. Pt denied nausea ,but has very poor appetite. He drank H2O and 2xcans of diet soda during the shift per pt. Pt did not eat. IVF infusing.  Problem: Nutrition: Goal: Adequate nutrition will be maintained Outcome: Not Progressing As above.

## 2017-03-20 NOTE — Progress Notes (Signed)
Inpatient Diabetes Program Recommendations  AACE/ADA: New Consensus Statement on Inpatient Glycemic Control (2015)  Target Ranges:  Prepandial:   less than 140 mg/dL      Peak postprandial:   less than 180 mg/dL (1-2 hours)      Critically ill patients:  140 - 180 mg/dL   Lab Results  Component Value Date   GLUCAP 369 (H) 03/20/2017   HGBA1C 11.3 (H) 03/17/2017    Review of Glycemic Control  Results for Wesley IslamRICHMOND, Wesley (MRN [id number]) as of 03/20/2017 10:22  Ref. Range 03/19/2017 11:32 03/19/2017 16:54 03/19/2017 20:13 03/20/2017 07:32 03/20/2017 08:45  Glucose-Capillary Latest Ref Range: 65 - 99 mg/dL 409191 (H) 811284 (H) 914160 (H) 373 (H) 369 (H)    Diabetes history:DM 1 Outpatient Diabetes medications: NPH 70/30 40 units QAM, NPH 70/30 20 units QPM, Novolog 5-10 units tid  Current orders for Inpatient glycemic control:  Lantus 30 units, Novolog Moderate + Novolog HS scale  A1c >14% on 12/31/16 in CareEverywhere  Inpatient Diabetes Program Recommendations: Patient's home doses of basal insulin are 28 units in the am (in NPH 70/30) and 14 units in the evening (in the NPH 70/30)  Consider discontinuing Lantus and changing him to Levemir 21 units bid beginning tonight.   If he is tolerating food, consider adding 3 units tid with meals. Continue Novolog correction as ordered.  Text page Dr. Pollyann KennedyKalisetti  Wesley Holstrom, RN, BA, [facility] Hospital[facility]HA, CDE Diabetes Coordinator Inpatient Diabetes Program  [phone] (Team Pager) [phone] [facility] Office) 03/20/2017 10:36 AM

## 2017-03-20 NOTE — Progress Notes (Signed)
[employer] [facility] - [place] at [facility]   PATIENT NAME: Wesley [name]    MR#:  [id number]  DATE OF BIRTH:  Oct 14, 1995  SUBJECTIVE:  CHIEF COMPLAINT:   Chief Complaint  Patient presents with  . Emesis   - abdominal pain is better, but continues to have constant nausea and 3 episodes of vomiting overnight  REVIEW OF SYSTEMS:  Review of Systems  Constitutional: Negative for chills, fever and malaise/fatigue.  HENT: Negative for ear discharge, hearing loss, nosebleeds and sore throat.   Respiratory: Negative for cough, shortness of breath and wheezing.   Cardiovascular: Negative for chest pain, palpitations and leg swelling.  Gastrointestinal: Positive for nausea and vomiting. Negative for abdominal pain, constipation and diarrhea.  Genitourinary: Negative for dysuria.  Musculoskeletal: Negative for myalgias.  Neurological: Negative for dizziness, speech change, focal weakness, seizures and headaches.  Psychiatric/Behavioral: Negative for depression.    DRUG ALLERGIES:  No Known Allergies  VITALS:  Blood pressure 130/88, pulse (!) 102, temperature 98.2 F (36.8 C), temperature source Oral, resp. rate 18, height 5\' 9"  (1.753 m), weight 73.6 kg (162 lb 4.1 oz), SpO2 100 %.  PHYSICAL EXAMINATION:  Physical Exam  GENERAL:  21 y.o.-year-old patient lying in the bed with no acute distress.  EYES: Pupils equal, round, reactive to light and accommodation. No scleral icterus. Extraocular muscles intact.  HEENT: Head atraumatic, normocephalic. Oropharynx and nasopharynx clear.  NECK:  Supple, no jugular venous distention. No thyroid enlargement, no tenderness.  LUNGS: Normal breath sounds bilaterally, no wheezing, rales,rhonchi or crepitation. No use of accessory muscles of respiration.  CARDIOVASCULAR: S1, S2 normal. No murmurs, rubs, or gallops.  ABDOMEN: Soft, nontender, nondistended. Bowel sounds present. No organomegaly or mass.  EXTREMITIES: No pedal edema,  cyanosis, or clubbing.  NEUROLOGIC: Cranial nerves II through XII are intact. Muscle strength 5/5 in all extremities. Sensation intact. Gait not checked.  PSYCHIATRIC: The patient is alert and oriented x 3.  SKIN: No obvious rash, lesion, or ulcer.    LABORATORY PANEL:   CBC  Recent Labs Lab 03/18/17 0255  WBC 8.0  HGB 16.7  HCT 47.8  PLT 276   ------------------------------------------------------------------------------------------------------------------  Chemistries   Recent Labs Lab 03/17/17 1643  03/19/17 0416  NA 133*  < > 140  K 4.6  < > 4.1  CL 94*  < > 103  CO2 15*  < > 24  GLUCOSE 481*  < > 179*  BUN 20  < > 14  CREATININE 1.28*  < > 1.27*  CALCIUM 10.4*  < > 10.3  AST 22  --   --   ALT 23  --   --   ALKPHOS 130*  --   --   BILITOT 1.7*  --   --   < > = values in this interval not displayed. ------------------------------------------------------------------------------------------------------------------  Cardiac Enzymes No results for input(s): TROPONINI in the last 168 hours. ------------------------------------------------------------------------------------------------------------------  RADIOLOGY:  Ct Abdomen Pelvis W Contrast  Result Date: 03/19/2017 CLINICAL DATA:  Abdominal pain, vomiting.  Diarrhea. EXAM: CT ABDOMEN AND PELVIS WITH CONTRAST TECHNIQUE: Multidetector CT imaging of the abdomen and pelvis was performed using the standard protocol following bolus administration of intravenous contrast. CONTRAST:  ISOVUE-300 IOPAMIDOL (ISOVUE-300) INJECTION 61% COMPARISON:  12/24/2011 FINDINGS: Lower chest: Lung bases are clear. No effusions. Heart is normal size. Hepatobiliary: No focal hepatic abnormality. Gallbladder unremarkable. Pancreas: No focal abnormality or ductal dilatation. Spleen: No focal abnormality.  Normal size. Adrenals/Urinary Tract: No adrenal abnormality. No focal  renal abnormality. No stones or hydronephrosis. Urinary bladder is  unremarkable. Stomach/Bowel: Normal appendix. Stomach, large and small bowel grossly unremarkable. Vascular/Lymphatic: No evidence of aneurysm or adenopathy. Reproductive: No visible focal abnormality. Other: No free fluid or free air. Musculoskeletal: Negative IMPRESSION: No acute findings in the abdomen or pelvis. Normal appendix. Electronically Signed   By: Charlett NoseKevin  Dover M.D.   On: 03/19/2017 10:39    EKG:  No orders found for this or any previous visit.  ASSESSMENT AND PLAN:   Delmer Islamshton Cofield  is a 21 y.o. male with a known history of Type 1 diabetes mellitus with poor control presents to hospital secondary to intractable nausea, vomiting and abdominal pain for the last couple of days.  #1 diabetic ketoacidosis-with uncontrolled type 1 diabetes mellitus -HbA1c is 11.3 - Continue IV fluids. Patient was on IV insulin drip in the ICU. Currently transitioned to Lantus daily and sliding scale insulin. Sugars are better controlled except thsi AM as his liquid diet was not carb controlled. - changed to liquid diet  #2hyponatremia- pseudo-hyponatremia secondary to elevated sugars. Improved now  #3 intractable nausea and vomiting- CT of the abdomen ordered to rule out any obstruction, but has been negative. Patient unable to tolerate oral contrast. -Could be GERD with gastroparesis. -on IV Protonix twice a day with improvement of abdominal pain - Reglan scheduled and compazine as needed and monitor.  #4 DVT Prophylaxis- lovenox     All the records are reviewed and case discussed with Care Management/Social Workerr. Management plans discussed with the patient, family and they are in agreement.  CODE STATUS: Full code  TOTAL TIME TAKING CARE OF THIS PATIENT: 36 minutes.   POSSIBLE D/C TOMORROW, DEPENDING ON CLINICAL CONDITION.   Enid BaasKALISETTI,Brenya Taulbee M.D on 03/20/2017 at 8:39 AM  Between 7am to 6pm - Pager - [phone]  After 6pm go to [email or web address] - Social research officer, governmentpassword EPAS [facility]  [facility]  [place] [facility]  Office  [phone]  CC: Primary care physician; [facility]

## 2017-03-21 LAB — BASIC METABOLIC PANEL
Anion gap: 9 (ref 5–15)
BUN: 17 mg/dL (ref 6–20)
CALCIUM: 9.8 mg/dL (ref 8.9–10.3)
CHLORIDE: 102 mmol/L (ref 101–111)
CO2: 28 mmol/L (ref 22–32)
CREATININE: 1.16 mg/dL (ref 0.61–1.24)
Glucose, Bld: 228 mg/dL — ABNORMAL HIGH (ref 65–99)
Potassium: 3.6 mmol/L (ref 3.5–5.1)
SODIUM: 139 mmol/L (ref 135–145)

## 2017-03-21 LAB — GLUCOSE, CAPILLARY
GLUCOSE-CAPILLARY: 144 mg/dL — AB (ref 65–99)
Glucose-Capillary: 230 mg/dL — ABNORMAL HIGH (ref 65–99)

## 2017-03-21 MED ORDER — INSULIN ASPART PROT & ASPART (70-30 MIX) 100 UNIT/ML ~~LOC~~ SUSP: 20.0000 [IU] | Freq: Two times a day (BID) | SUBCUTANEOUS | 11 refills | Status: DC

## 2017-03-21 MED ORDER — BLOOD GLUCOSE MONITOR KIT: PACK | 0 refills | Status: DC

## 2017-03-21 MED ORDER — PROCHLORPERAZINE MALEATE 10 MG PO TABS: 10.0000 mg | ORAL_TABLET | Freq: Three times a day (TID) | ORAL | 1 refills | Status: DC | PRN

## 2017-03-21 MED ORDER — METOCLOPRAMIDE HCL 10 MG PO TABS: 10.0000 mg | ORAL_TABLET | Freq: Three times a day (TID) | ORAL | 1 refills | Status: DC

## 2017-03-21 MED ORDER — INSULIN ASPART 100 UNIT/ML ~~LOC~~ SOLN: 5.0000 [IU] | Freq: Three times a day (TID) | SUBCUTANEOUS | 2 refills | Status: DC

## 2017-03-21 MED ORDER — SENNOSIDES-DOCUSATE SODIUM 8.6-50 MG PO TABS
1.0000 | ORAL_TABLET | Freq: Two times a day (BID) | ORAL | Status: DC
  Administered 2017-03-21: 09:00:00 1 via ORAL
  Filled 2017-03-21: qty 1

## 2017-03-21 NOTE — Progress Notes (Signed)
Received MD order to discharge patient to home, reviewed home meds prescriptions and follow up appointments with patient and patient verbalized understanding

## 2017-03-21 NOTE — Care Management Note (Signed)
Case Management Note  Patient Details  Name: Wesley [name] MRN: [id number] Date of Birth: January 22, 1996  Subjective/Objective:    Call to Thayer Ohmhris RN to please give [name] a copy of Dr Sung AmabileSimonds order for a Glucometer. This order specifies to the pharmacist to provide a Medicaid approved Glucometer for [name].                 Action/Plan:   Expected Discharge Date:  03/21/17               Expected Discharge Plan:     In-House Referral:     Discharge planning Services     Post Acute Care Choice:    Choice offered to:     DME Arranged:    DME Agency:     HH Arranged:    HH Agency:     Status of Service:   Completed  If discussed at [employer] Length of Stay Meetings, dates discussed:    Additional Comments:  Syrai Gladwin A, RN 03/21/2017, 8:28 AM

## 2017-03-21 NOTE — Discharge Summary (Signed)
[place] [facility] [place] NAME: Wesley [name]    MR#:  [id number]  DATE OF BIRTH:  Oct 22, 1995  DATE OF ADMISSION:  03/17/2017   ADMITTING PHYSICIAN: Gladstone Lighter, MD  DATE OF DISCHARGE: 03/21/2017  PRIMARY CARE PHYSICIAN: [facility]   ADMISSION DIAGNOSIS:   Gastroenteritis [K52.9] Diabetic ketoacidosis without coma associated with type 1 diabetes mellitus ([place]) [E10.10]  DISCHARGE DIAGNOSIS:   Active Problems:   DKA (diabetic ketoacidoses) ([place])   SECONDARY DIAGNOSIS:   Past Medical History:  Diagnosis Date  . Diabetes mellitus without complication ([place])    type 1 diabetes    HOSPITAL COURSE:   Wesley [name] a 21 y.o. malewith a known history of Type 1 diabetes mellitus with poor control presents to hospital secondary to intractable nausea, vomiting and abdominal pain for the last couple of days.  #1 diabetic ketoacidosis-with uncontrolled type 1 diabetes mellitus -HbA1c is 11.3 - Patient was on IV insulin drip in the ICU. Currently transitioned to Levemir twice a day here in the hospital and discharged back on his home medications of 70/30 insulin. Advised to take 40 units in the morning and 30 units at bedtime. -Also on NovoLog insulin 3 times a day with meals -Advised to follow[facility] with [facility]  #2hyponatremia- pseudo-hyponatremia secondary to elevated sugars. Improved now  #3 intractable nausea and vomiting- secondary to gastroparesis. -CT of the abdomen negative for any acute findings. . -Could be GERD with gastroparesis. -Symptoms improved with Reglan and Compazine. Being discharged on Reglan 3 times a day and also as needed Compazine. Completely resolved at this time and able to tolerate carbohydrate control diet prior to discharge.  Patient is stable and is being discharged today.   DISCHARGE CONDITIONS:   Stable  CONSULTS OBTAINED:    Intensivist consult Dr. Alva Garnet  DRUG  ALLERGIES:   No Known Allergies DISCHARGE MEDICATIONS:   Allergies as of 03/21/2017   No Known Allergies     Medication List    STOP taking these medications   insulin glargine 100 UNIT/ML injection Commonly known as:  LANTUS   meloxicam 15 MG tablet Commonly known as:  MOBIC   traMADol 50 MG tablet Commonly known as:  ULTRAM     TAKE these medications   blood glucose meter kit and supplies Kit Dispense based on patient and insurance preference. Use up to four times daily as directed. (FOR ICD-9 250.00, 250.01).   insulin aspart 100 UNIT/ML injection Commonly known as:  novoLOG Inject 5 Units into the skin 3 (three) times daily before meals. Per sliding scale What changed:  how much to take   insulin aspart protamine- aspart (70-30) 100 UNIT/ML injection Commonly known as:  NOVOLOG MIX 70/30 Inject 0.2-0.4 mLs (20-40 Units total) into the skin 2 (two) times daily with a meal. Inject 40 units qam and 30 qhs What changed:  additional instructions   metoCLOPramide 10 MG tablet Commonly known as:  REGLAN Take 1 tablet (10 mg total) by mouth 3 (three) times daily before meals.   prochlorperazine 10 MG tablet Commonly known as:  COMPAZINE Take 1 tablet (10 mg total) by mouth every 8 (eight) hours as needed for nausea or vomiting.            Durable Medical Equipment        Start     Ordered   03/18/17 1121  DME Glucometer  Once    Comments:  And supplies- PLEASE PROVIDE GLUCOMETER COVERED UNDER  [place] MEDICAID.   03/18/17 1121       DISCHARGE INSTRUCTIONS:   1. PCP follow-up in 1-2 weeks  DIET:   Diabetic diet  ACTIVITY:   Activity as tolerated  OXYGEN:   Home Oxygen: No.  Oxygen Delivery: room air  DISCHARGE LOCATION:   home   If you experience worsening of your admission symptoms, develop shortness of breath, life threatening emergency, suicidal or homicidal thoughts you must seek medical attention immediately by calling 911 or calling your MD  immediately  if symptoms less severe.  You Must read complete instructions/literature along with all the possible adverse reactions/side effects for all the Medicines you take and that have been prescribed to you. Take any new Medicines after you have completely understood and accpet all the possible adverse reactions/side effects.   Please note  You were cared for by a hospitalist during your hospital stay. If you have any questions about your discharge medications or the care you received while you were in the hospital after you are discharged, you can call the unit and asked to speak with the hospitalist on call if the hospitalist that took care of you is not available. Once you are discharged, your primary care physician will handle any further medical issues. Please note that NO REFILLS for any discharge medications will be authorized once you are discharged, as it is imperative that you return to your primary care physician (or establish a relationship with a primary care physician if you do not have one) for your aftercare needs so that they can reassess your need for medications and monitor your lab values.    On the day of Discharge:  VITAL SIGNS:   Blood pressure (!) 144/85, pulse 77, temperature 99.1 F (37.3 C), temperature source Oral, resp. rate 20, height '5\' 9"'  (1.753 m), weight 73.6 kg (162 lb 4.1 oz), SpO2 97 %.  PHYSICAL EXAMINATION:    GENERAL:  21 y.o.-year-old patient lying in the bed with no acute distress.  EYES: Pupils equal, round, reactive to light and accommodation. No scleral icterus. Extraocular muscles intact.  HEENT: Head atraumatic, normocephalic. Oropharynx and nasopharynx clear.  NECK:  Supple, no jugular venous distention. No thyroid enlargement, no tenderness.  LUNGS: Normal breath sounds bilaterally, no wheezing, rales,rhonchi or crepitation. No use of accessory muscles of respiration.  CARDIOVASCULAR: S1, S2 normal. No murmurs, rubs, or gallops.    ABDOMEN: Soft, non-tender, non-distended. Bowel sounds present. No organomegaly or mass.  EXTREMITIES: No pedal edema, cyanosis, or clubbing.  NEUROLOGIC: Cranial nerves II through XII are intact. Muscle strength 5/5 in all extremities. Sensation intact. Gait not checked.  PSYCHIATRIC: The patient is alert and oriented x 3.  SKIN: No obvious rash, lesion, or ulcer.   DATA REVIEW:   CBC  Recent Labs Lab 03/18/17 0255  WBC 8.0  HGB 16.7  HCT 47.8  PLT 276    Chemistries   Recent Labs Lab 03/17/17 1643  03/21/17 0255  NA 133*  < > 139  K 4.6  < > 3.6  CL 94*  < > 102  CO2 15*  < > 28  GLUCOSE 481*  < > 228*  BUN 20  < > 17  CREATININE 1.28*  < > 1.16  CALCIUM 10.4*  < > 9.8  AST 22  --   --   ALT 23  --   --   ALKPHOS 130*  --   --   BILITOT 1.7*  --   --   < > =  values in this interval not displayed.   Microbiology Results  Results for orders placed or performed during the hospital encounter of 03/17/17  MRSA PCR Screening     Status: None   Collection Time: 03/17/17 10:38 PM  Result Value Ref Range Status   MRSA by PCR NEGATIVE NEGATIVE Final    Comment:        The GeneXpert MRSA Assay (FDA approved for NASAL specimens only), is one component of a comprehensive MRSA colonization surveillance program. It is not intended to diagnose MRSA infection nor to guide or monitor treatment for MRSA infections.     RADIOLOGY:  No results found.   Management plans discussed with the patient, family and they are in agreement.  CODE STATUS:     Code Status Orders        Start     Ordered   03/17/17 2044  Full code  Continuous     03/17/17 2043    Code Status History    Date Active Date Inactive Code Status Order ID Comments User Context   09/01/2016  8:47 PM 09/03/2016  5:16 PM Full Code [id number]  Gladstone Lighter, MD Inpatient   10/02/2015  8:11 AM 10/03/2015  4:05 PM Full Code [id number]  Harrie Foreman, MD Inpatient      TOTAL TIME TAKING  CARE OF THIS PATIENT: 37 minutes.    Mirka Barbone M.D on 03/21/2017 at 12:54 PM  Between 7am to 6pm - Pager - [phone]  After 6pm go to [email or web address] - Proofreader  [facility] [place] [facility]  Office  [phone]  CC: Primary care physician; [facility]   Note: This dictation was prepared with Dragon dictation along with smaller phrase technology. Any transcriptional errors that result from this process are unintentional.

## 2017-11-08 ENCOUNTER — Emergency Department
Admission: EM | Admit: 2017-11-08 | Discharge: 2017-11-08 | Disposition: A | Discharge status: Home / Self Care | Payer: Medicaid Other | Attending: Emergency Medicine | Admitting: Emergency Medicine

## 2017-11-08 DIAGNOSIS — E109 Type 1 diabetes mellitus without complications: Secondary | ICD-10-CM | POA: Insufficient documentation

## 2017-11-08 DIAGNOSIS — K625 Hemorrhage of anus and rectum: Secondary | ICD-10-CM | POA: Diagnosis present

## 2017-11-08 DIAGNOSIS — Z79899 Other long term (current) drug therapy: Secondary | ICD-10-CM | POA: Insufficient documentation

## 2017-11-08 DIAGNOSIS — Z794 Long term (current) use of insulin: Secondary | ICD-10-CM | POA: Insufficient documentation

## 2017-11-08 DIAGNOSIS — F17210 Nicotine dependence, cigarettes, uncomplicated: Secondary | ICD-10-CM | POA: Insufficient documentation

## 2017-11-08 DIAGNOSIS — K649 Unspecified hemorrhoids: Secondary | ICD-10-CM | POA: Insufficient documentation

## 2017-11-08 MED ORDER — LIDOCAINE-EPINEPHRINE (PF) 1 %-1:200000 IJ SOLN
INTRAMUSCULAR | Status: AC
  Filled 2017-11-08: qty 30

## 2017-11-08 NOTE — ED Provider Notes (Signed)
[facility] Emergency Department Provider Note   ____________________________________________    I have reviewed the triage vital signs and the nursing notes.   HISTORY  Chief Complaint Rectal Bleeding     HPI Wesley [name] is a 22 y.o. male who presents with complaints of rectal bleeding and hemorrhoids.  Patient reports he had GI virus last week and thinks that is when he developed hemorrhoids.  Over the last several days have developed pain and bleeding after bowel movements.  Has been on stool softeners and has been taking over-the-counter medications with little relief.  No fevers or chills.  No diarrhea or nausea vomiting   Past Medical History:  Diagnosis Date  . Diabetes mellitus without complication ([place])    type 1 diabetes    Patient Active Problem List   Diagnosis Date Noted  . DKA (diabetic ketoacidoses) ([place]) 09/01/2016  . Sepsis ([place]) 10/02/2015  . Scrotal abscess   . Diabetes mellitus with ketoacidosis ([place]) 08/13/2012  . Type 1 diabetes mellitus ([place]) 02/06/2012    Past Surgical History:  Procedure Laterality Date  . HAND SURGERY    . INCISION AND DRAINAGE ABSCESS N/A 10/02/2015   Procedure: INCISION AND DRAINAGE ABSCESS;  Surgeon: Festus Aloe, MD;  Location: [facility] ORS;  Service: Urology;  Laterality: N/A;  . TONSILLECTOMY    . TONSILLECTOMY AND ADENOIDECTOMY    . UMBILICAL HERNIA REPAIR      Prior to Admission medications   Medication Sig Start Date End Date Taking? Authorizing Provider  blood glucose meter kit and supplies KIT Dispense based on patient and insurance preference. Use up to four times daily as directed. (FOR ICD-9 250.00, 250.01). 03/21/17   Gladstone Lighter, MD  insulin aspart (NOVOLOG) 100 UNIT/ML injection Inject 5 Units into the skin 3 (three) times daily before meals. Per sliding scale 03/21/17   Gladstone Lighter, MD  insulin aspart protamine- aspart (NOVOLOG MIX 70/30) (70-30) 100 UNIT/ML  injection Inject 0.2-0.4 mLs (20-40 Units total) into the skin 2 (two) times daily with a meal. Inject 40 units qam and 30 qhs 03/21/17   Gladstone Lighter, MD  metoCLOPramide (REGLAN) 10 MG tablet Take 1 tablet (10 mg total) by mouth 3 (three) times daily before meals. 03/21/17 05/20/17  Gladstone Lighter, MD  prochlorperazine (COMPAZINE) 10 MG tablet Take 1 tablet (10 mg total) by mouth every 8 (eight) hours as needed for nausea or vomiting. 03/21/17 03/21/18  Gladstone Lighter, MD     Allergies Patient has no known allergies.  Family History  Problem Relation Age of Onset  . Diabetes Mellitus II Paternal Grandfather   . Hypertension Mother     Social History Social History   Tobacco Use  . Smoking status: Current Some Day Smoker    Packs/day: 0.50    Types: Cigarettes  . Smokeless tobacco: Never Used  Substance Use Topics  . Alcohol use: Yes    Comment: occasional  . Drug use: No    Review of Systems  Constitutional: No fever/chills     Gastrointestinal: No abdominal pain.  No nausea, no vomiting.   Genitourinary: Negative for dysuria. Musculoskeletal: Negative for pain Skin: Negative for rash. Neurological: Negative for headaches     ____________________________________________   PHYSICAL EXAM:  VITAL SIGNS: ED Triage Vitals  Enc Vitals Group     BP 11/08/17 1728 128/85     Pulse Rate 11/08/17 1728 70     Resp 11/08/17 1728 14     Temp 11/08/17 1728 99.1  F (37.3 C)     Temp Source 11/08/17 1728 Oral     SpO2 11/08/17 1728 100 %     Weight 11/08/17 1729 79.4 kg (175 lb)     Height 11/08/17 1729 1.753 m (_0 )     Head Circumference --      Peak Flow --      Pain Score 11/08/17 1729 5     Pain Loc --      Pain Edu? --      Excl. in [place]? --     Constitutional: Alert and oriented. No acute distress. Pleasant and interactive Eyes: Conjunctivae are normal.   Nose: No congestion/rhinnorhea. Mouth/Throat: Mucous membranes are moist.   Cardiovascular:  Normal rate, regular rhythm.  Respiratory: Normal respiratory effort.  No retractions. GI: On rectal exam large external thrombosed hemorrhoid 9:00 Genitourinary: deferred  Neurologic:  Normal speech and language. No gross focal neurologic deficits are appreciated.   Skin:  Skin is warm, dry and intact. No rash noted.   ____________________________________________   LABS (all labs ordered are listed, but only abnormal results are displayed)  Labs Reviewed - No data to display ____________________________________________  EKG   ____________________________________________  RADIOLOGY  None ____________________________________________   PROCEDURES  Procedure(s) performed: yes  .Marland KitchenIncision and Drainage Date/Time: 11/08/2017 7:21 PM Performed by: Lavonia Drafts, MD Authorized by: Lavonia Drafts, MD   Consent:    Consent obtained:  Verbal   Consent given by:  Patient   Risks discussed:  Bleeding, incomplete drainage, pain, infection and damage to other organs   Alternatives discussed:  No treatment Location:    Type:  External thrombosed hemorrhoid   Size:  3 cm   Location:  Anogenital   Anogenital location:  Perianal Pre-procedure details:    Skin preparation:  Betadine Anesthesia (see MAR for exact dosages):    Anesthesia method:  Local infiltration   Local anesthetic:  Lidocaine 1% WITH epi Procedure type:    Complexity:  Complex Procedure details:    Needle aspiration: no     Incision types:  Elliptical   Incision depth:  Dermal   Scalpel blade:  11   Wound management:  Probed and deloculated   Drainage:  Bloody   Drainage amount:  Moderate   Wound treatment:  Wound left open   Packing materials:  None Post-procedure details:    Patient tolerance of procedure:  Tolerated well, no immediate complications     Critical Care performed: No ____________________________________________   INITIAL IMPRESSION / ASSESSMENT AND PLAN / ED COURSE  Pertinent  labs & imaging results that were available during my care of the patient were reviewed by me and considered in my medical decision making (see chart for details).  Patient presents with external thrombosed hemorrhoid causing significant pain.  This was opened and thrombosed clot was removed as described above.   ____________________________________________   FINAL CLINICAL IMPRESSION(S) / ED DIAGNOSES  Final diagnoses:  Hemorrhoids, unspecified hemorrhoid type      NEW MEDICATIONS STARTED DURING THIS VISIT:  Discharge Medication List as of 11/08/2017  7:07 PM       Note:  This document was prepared using Dragon voice recognition software and may include unintentional dictation errors.    Lavonia Drafts, MD 11/08/17 [phone]

## 2017-11-08 NOTE — ED Notes (Signed)
Esign not working pt verbalized discharge instructions and has no questions at this time 

## 2017-11-08 NOTE — ED Notes (Signed)
FN: pt brought in by ems with reports of bleeding hemorrhoids today.

## 2017-11-08 NOTE — ED Triage Notes (Signed)
Pt presents via EMS c/o "hemorrhoid pain". Reports rectal bleeding and pain x1 week, unrelieved with OTC medication.

## 2017-11-10 ENCOUNTER — Telehealth: Payer: Self-pay | Admitting: General Practice

## 2017-11-10 NOTE — Telephone Encounter (Signed)
-----   Message from Nicole KindredAngela K Brouillard sent at 11/10/2017  1:32 PM EST ----- Regarding: ED referral-new patient Patient seen in ED for external thrombosed hemorrhoid. Patient had I&D procedure done by ED physician. Please call patient to see if he would like to follow up for this. Can be seen by any provider. New patient.

## 2017-11-10 NOTE — Telephone Encounter (Signed)
Left a message with the patients mother to have her son call our office.

## 2017-11-17 ENCOUNTER — Encounter: Payer: Self-pay | Admitting: General Practice

## 2017-11-17 NOTE — Telephone Encounter (Signed)
Left a message for the patient to call the office to schedule appointment, I have also mailed a letter for the patient to contact us. Please schedule if the patient calls back.

## 2018-02-09 ENCOUNTER — Other Ambulatory Visit: Payer: Self-pay

## 2018-02-09 ENCOUNTER — Encounter: Payer: Self-pay | Admitting: Emergency Medicine

## 2018-02-09 ENCOUNTER — Emergency Department
Admission: EM | Admit: 2018-02-09 | Discharge: 2018-02-09 | Disposition: A | Discharge status: Home / Self Care | Payer: Medicaid Other | Attending: Emergency Medicine | Admitting: Emergency Medicine

## 2018-02-09 DIAGNOSIS — R110 Nausea: Secondary | ICD-10-CM | POA: Insufficient documentation

## 2018-02-09 DIAGNOSIS — Z5321 Procedure and treatment not carried out due to patient leaving prior to being seen by health care provider: Secondary | ICD-10-CM | POA: Insufficient documentation

## 2018-02-09 LAB — CBC
HEMATOCRIT: 52.9 % — AB (ref 40.0–52.0)
Hemoglobin: 18.1 g/dL — ABNORMAL HIGH (ref 13.0–18.0)
MCH: 29.4 pg (ref 26.0–34.0)
MCHC: 34.1 g/dL (ref 32.0–36.0)
MCV: 86.2 fL (ref 80.0–100.0)
Platelets: 281 10*3/uL (ref 150–440)
RBC: 6.14 MIL/uL — ABNORMAL HIGH (ref 4.40–5.90)
RDW: 14.3 % (ref 11.5–14.5)
WBC: 5.5 10*3/uL (ref 3.8–10.6)

## 2018-02-09 LAB — COMPREHENSIVE METABOLIC PANEL
ALBUMIN: 4.8 g/dL (ref 3.5–5.0)
ALK PHOS: 90 U/L (ref 38–126)
ALT: 26 U/L (ref 17–63)
AST: 21 U/L (ref 15–41)
Anion gap: 17 — ABNORMAL HIGH (ref 5–15)
BILIRUBIN TOTAL: 1.3 mg/dL — AB (ref 0.3–1.2)
BUN: 15 mg/dL (ref 6–20)
CALCIUM: 9.7 mg/dL (ref 8.9–10.3)
CO2: 15 mmol/L — AB (ref 22–32)
Chloride: 101 mmol/L (ref 101–111)
Creatinine, Ser: 1.18 mg/dL (ref 0.61–1.24)
GFR calc Af Amer: >60 mL/min (ref >60)
GFR calc non Af Amer: >60 mL/min (ref >60)
GLUCOSE: 352 mg/dL — AB (ref 65–99)
Potassium: 4.8 mmol/L (ref 3.5–5.1)
SODIUM: 133 mmol/L — AB (ref 135–145)
Total Protein: 7.8 g/dL (ref 6.5–8.1)

## 2018-02-09 LAB — LIPASE, BLOOD: Lipase: 19 U/L (ref 11–51)

## 2018-02-09 NOTE — ED Notes (Signed)
First Nurse Note:  Patient to registration via [facility] complaining of nausea and weakness.  Alert and oriented.

## 2018-02-09 NOTE — ED Triage Notes (Signed)
Pt to ED via POV c/o nausea. Pt states that he fees like he does not have any energy. Pt states that symptoms started yesterday. Pt states that he has vomited once or twice. Pt denies diarrhea or fever. Pt is in NAD at this time.

## 2018-02-10 DIAGNOSIS — F17200 Nicotine dependence, unspecified, uncomplicated: Secondary | ICD-10-CM | POA: Insufficient documentation

## 2018-02-11 MED ORDER — ACETAMINOPHEN 325 MG PO TABS
650.00 | ORAL_TABLET | ORAL | Status: DC
Start: ? — End: 2018-02-11

## 2018-02-11 MED ORDER — BISACODYL 5 MG PO TBEC
10.00 | DELAYED_RELEASE_TABLET | ORAL | Status: DC
Start: ? — End: 2018-02-11

## 2018-02-11 MED ORDER — INSULIN LISPRO 100 UNIT/ML ~~LOC~~ SOLN
0.00 | SUBCUTANEOUS | Status: DC
Start: 2018-02-11 — End: 2018-02-11

## 2018-02-11 MED ORDER — METOCLOPRAMIDE HCL 10 MG PO TABS
5.00 | ORAL_TABLET | ORAL | Status: DC
Start: 2018-02-11 — End: 2018-02-11

## 2018-02-11 MED ORDER — INSULIN LISPRO 100 UNIT/ML ~~LOC~~ SOLN
6.00 | SUBCUTANEOUS | Status: DC
Start: 2018-02-11 — End: 2018-02-11

## 2018-02-11 MED ORDER — GENERIC EXTERNAL MEDICATION
1.00 | Status: DC
Start: 2018-02-11 — End: 2018-02-11

## 2018-02-11 MED ORDER — ENOXAPARIN SODIUM 40 MG/0.4ML ~~LOC~~ SOLN
40.00 | SUBCUTANEOUS | Status: DC
Start: 2018-02-11 — End: 2018-02-11

## 2018-02-11 MED ORDER — DEXTROSE 50 % IV SOLN
12.50 | INTRAVENOUS | Status: DC
Start: ? — End: 2018-02-11

## 2018-02-11 MED ORDER — PROMETHAZINE HCL 25 MG/ML IJ SOLN
12.50 | INTRAMUSCULAR | Status: DC
Start: ? — End: 2018-02-11

## 2018-02-11 MED ORDER — INSULIN GLARGINE 100 UNIT/ML ~~LOC~~ SOLN
20.00 | SUBCUTANEOUS | Status: DC
Start: 2018-02-11 — End: 2018-02-11

## 2018-07-11 ENCOUNTER — Inpatient Hospital Stay: Payer: Medicaid Other

## 2018-07-11 ENCOUNTER — Inpatient Hospital Stay
Admission: EM | Admit: 2018-07-11 | Discharge: 2018-07-13 | DRG: 638 | Disposition: A | Discharge status: Home / Self Care | Payer: Medicaid Other | Attending: Internal Medicine | Admitting: Internal Medicine

## 2018-07-11 ENCOUNTER — Other Ambulatory Visit: Payer: Self-pay

## 2018-07-11 DIAGNOSIS — Z8249 Family history of ischemic heart disease and other diseases of the circulatory system: Secondary | ICD-10-CM

## 2018-07-11 DIAGNOSIS — E1110 Type 2 diabetes mellitus with ketoacidosis without coma: Secondary | ICD-10-CM | POA: Diagnosis present

## 2018-07-11 DIAGNOSIS — Z9089 Acquired absence of other organs: Secondary | ICD-10-CM

## 2018-07-11 DIAGNOSIS — Z794 Long term (current) use of insulin: Secondary | ICD-10-CM

## 2018-07-11 DIAGNOSIS — E1010 Type 1 diabetes mellitus with ketoacidosis without coma: Secondary | ICD-10-CM

## 2018-07-11 DIAGNOSIS — D72829 Elevated white blood cell count, unspecified: Secondary | ICD-10-CM | POA: Diagnosis present

## 2018-07-11 DIAGNOSIS — E860 Dehydration: Secondary | ICD-10-CM | POA: Diagnosis present

## 2018-07-11 DIAGNOSIS — F17210 Nicotine dependence, cigarettes, uncomplicated: Secondary | ICD-10-CM | POA: Diagnosis present

## 2018-07-11 DIAGNOSIS — Z833 Family history of diabetes mellitus: Secondary | ICD-10-CM

## 2018-07-11 DIAGNOSIS — N179 Acute kidney failure, unspecified: Secondary | ICD-10-CM | POA: Diagnosis present

## 2018-07-11 DIAGNOSIS — E875 Hyperkalemia: Secondary | ICD-10-CM | POA: Diagnosis present

## 2018-07-11 DIAGNOSIS — Z8619 Personal history of other infectious and parasitic diseases: Secondary | ICD-10-CM

## 2018-07-11 DIAGNOSIS — E10649 Type 1 diabetes mellitus with hypoglycemia without coma: Secondary | ICD-10-CM | POA: Diagnosis not present

## 2018-07-11 LAB — PHOSPHORUS: PHOSPHORUS: 6.1 mg/dL — AB (ref 2.5–4.6)

## 2018-07-11 LAB — BLOOD GAS, ARTERIAL
Acid-base deficit: 23.5 mmol/L — ABNORMAL HIGH (ref 0.0–2.0)
Bicarbonate: 5.4 mmol/L — ABNORMAL LOW (ref 20.0–28.0)
FIO2: 0.21
O2 Saturation: 94.6 %
PATIENT TEMPERATURE: 37
PCO2 ART: 20 mmHg — AB (ref 32.0–48.0)
PH ART: 7.04 — AB (ref 7.350–7.450)
PO2 ART: 105 mmHg (ref 83.0–108.0)

## 2018-07-11 LAB — CBC WITH DIFFERENTIAL/PLATELET
Basophils Absolute: 0.1 10*3/uL (ref 0–0.1)
Basophils Relative: 1 %
Eosinophils Absolute: 0.1 10*3/uL (ref 0–0.7)
Eosinophils Relative: 1 %
HCT: 50.1 % (ref 40.0–52.0)
HEMOGLOBIN: 17 g/dL (ref 13.0–18.0)
LYMPHS ABS: 2.2 10*3/uL (ref 1.0–3.6)
Lymphocytes Relative: 19 %
MCH: 30.2 pg (ref 26.0–34.0)
MCHC: 33.8 g/dL (ref 32.0–36.0)
MCV: 89.2 fL (ref 80.0–100.0)
MONOS PCT: 3 %
Monocytes Absolute: 0.3 10*3/uL (ref 0.2–1.0)
NEUTROS PCT: 76 %
Neutro Abs: 9 10*3/uL — ABNORMAL HIGH (ref 1.4–6.5)
Platelets: 277 10*3/uL (ref 150–440)
RBC: 5.61 MIL/uL (ref 4.40–5.90)
RDW: 13.8 % (ref 11.5–14.5)
WBC: 11.7 10*3/uL — ABNORMAL HIGH (ref 3.8–10.6)

## 2018-07-11 LAB — BASIC METABOLIC PANEL
Anion gap: 25 — ABNORMAL HIGH (ref 5–15)
Anion gap: 17 — ABNORMAL HIGH (ref 5–15)
BUN: 23 mg/dL — AB (ref 6–20)
BUN: 22 mg/dL — ABNORMAL HIGH (ref 6–20)
CHLORIDE: 101 mmol/L (ref 98–111)
CHLORIDE: 111 mmol/L (ref 98–111)
CO2: 8 mmol/L — ABNORMAL LOW (ref 22–32)
CO2: 11 mmol/L — AB (ref 22–32)
CREATININE: 1.4 mg/dL — AB (ref 0.61–1.24)
CREATININE: 1.41 mg/dL — AB (ref 0.61–1.24)
Calcium: 9.6 mg/dL (ref 8.9–10.3)
Calcium: 9.2 mg/dL (ref 8.9–10.3)
GFR calc Af Amer: >60 mL/min (ref >60)
GFR calc non Af Amer: >60 mL/min (ref >60)
GFR calc non Af Amer: >60 mL/min (ref >60)
Glucose, Bld: 524 mg/dL (ref 70–99)
Glucose, Bld: 289 mg/dL — ABNORMAL HIGH (ref 70–99)
Potassium: 7.1 mmol/L (ref 3.5–5.1)
Potassium: 6.2 mmol/L — ABNORMAL HIGH (ref 3.5–5.1)
Sodium: 134 mmol/L — ABNORMAL LOW (ref 135–145)
Sodium: 139 mmol/L (ref 135–145)

## 2018-07-11 LAB — GLUCOSE, CAPILLARY
GLUCOSE-CAPILLARY: 506 mg/dL — AB (ref 70–99)
GLUCOSE-CAPILLARY: 246 mg/dL — AB (ref 70–99)
Glucose-Capillary: 213 mg/dL — ABNORMAL HIGH (ref 70–99)
Glucose-Capillary: 299 mg/dL — ABNORMAL HIGH (ref 70–99)
Glucose-Capillary: 384 mg/dL — ABNORMAL HIGH (ref 70–99)
Glucose-Capillary: 558 mg/dL (ref 70–99)

## 2018-07-11 LAB — URINALYSIS, COMPLETE (UACMP) WITH MICROSCOPIC
BACTERIA UA: NONE SEEN
BILIRUBIN URINE: NEGATIVE
Glucose, UA: >=500 mg/dL — AB
Ketones, ur: 80 mg/dL — AB
Leukocytes, UA: NEGATIVE
Nitrite: NEGATIVE
Protein, ur: 30 mg/dL — AB
SPECIFIC GRAVITY, URINE: 1.021 (ref 1.005–1.030)
pH: 5 (ref 5.0–8.0)

## 2018-07-11 LAB — CBC
HCT: 50.1 % (ref 40.0–52.0)
Hemoglobin: 16.9 g/dL (ref 13.0–18.0)
MCH: 29.9 pg (ref 26.0–34.0)
MCHC: 33.7 g/dL (ref 32.0–36.0)
MCV: 88.7 fL (ref 80.0–100.0)
PLATELETS: 251 10*3/uL (ref 150–440)
RBC: 5.64 MIL/uL (ref 4.40–5.90)
RDW: 13.3 % (ref 11.5–14.5)
WBC: 22.1 10*3/uL — ABNORMAL HIGH (ref 3.8–10.6)

## 2018-07-11 LAB — PROCALCITONIN: Procalcitonin: 1.13 ng/mL

## 2018-07-11 LAB — MRSA PCR SCREENING: MRSA by PCR: NEGATIVE

## 2018-07-11 LAB — MAGNESIUM: MAGNESIUM: 2.3 mg/dL (ref 1.7–2.4)

## 2018-07-11 MED ORDER — SODIUM CHLORIDE 0.9 % IV SOLN
INTRAVENOUS | Status: DC
  Administered 2018-07-11: 22:00:00 via INTRAVENOUS

## 2018-07-11 MED ORDER — ONDANSETRON HCL 4 MG/2ML IJ SOLN
INTRAMUSCULAR | Status: AC
  Administered 2018-07-11: 4 mg via INTRAVENOUS
  Filled 2018-07-11: qty 2

## 2018-07-11 MED ORDER — SODIUM CHLORIDE 0.9 % IV SOLN
1.0000 g | Freq: Once | INTRAVENOUS | Status: AC
  Administered 2018-07-11: 1 g via INTRAVENOUS
  Filled 2018-07-11: qty 10

## 2018-07-11 MED ORDER — SODIUM CHLORIDE 0.9 % IV BOLUS
1000.0000 mL | Freq: Once | INTRAVENOUS | Status: AC
  Administered 2018-07-11: 1000 mL via INTRAVENOUS

## 2018-07-11 MED ORDER — SODIUM CHLORIDE 0.9 % IV SOLN: INTRAVENOUS | Status: AC

## 2018-07-11 MED ORDER — ONDANSETRON HCL 4 MG/2ML IJ SOLN
4.0000 mg | Freq: Once | INTRAMUSCULAR | Status: AC
  Administered 2018-07-11: 4 mg via INTRAVENOUS

## 2018-07-11 MED ORDER — PATIROMER SORBITEX CALCIUM 8.4 G PO PACK
8.4000 g | PACK | Freq: Every day | ORAL | Status: DC
  Administered 2018-07-11: 8.4 g via ORAL
  Filled 2018-07-11 (×2): qty 1

## 2018-07-11 MED ORDER — ENOXAPARIN SODIUM 40 MG/0.4ML ~~LOC~~ SOLN
40.0000 mg | SUBCUTANEOUS | Status: DC
  Administered 2018-07-11 – 2018-07-12 (×2): 40 mg via SUBCUTANEOUS
  Filled 2018-07-11 (×2): qty 0.4

## 2018-07-11 MED ORDER — DEXTROSE-NACL 5-0.45 % IV SOLN
INTRAVENOUS | Status: DC
  Administered 2018-07-12: 12:00:00 via INTRAVENOUS

## 2018-07-11 MED ORDER — SODIUM CHLORIDE 0.9 % IV SOLN
INTRAVENOUS | Status: DC
  Administered 2018-07-11: 5 [IU]/h via INTRAVENOUS
  Filled 2018-07-11: qty 1

## 2018-07-11 MED ORDER — SODIUM BICARBONATE 8.4 % IV SOLN
100.0000 meq | Freq: Once | INTRAVENOUS | Status: AC
  Administered 2018-07-11: 100 meq via INTRAVENOUS
  Filled 2018-07-11: qty 100

## 2018-07-11 MED ORDER — METOCLOPRAMIDE HCL 5 MG/ML IJ SOLN: 5.0000 mg | Freq: Four times a day (QID) | INTRAMUSCULAR | Status: DC | PRN

## 2018-07-11 MED ORDER — STERILE WATER FOR INJECTION IV SOLN
INTRAVENOUS | Status: DC
  Administered 2018-07-11 – 2018-07-12 (×2): via INTRAVENOUS
  Filled 2018-07-11 (×3): qty 850

## 2018-07-11 MED ORDER — HYDROCODONE-ACETAMINOPHEN 5-325 MG PO TABS
1.0000 | ORAL_TABLET | ORAL | Status: DC | PRN
  Administered 2018-07-11: 1 via ORAL
  Filled 2018-07-11: qty 1

## 2018-07-11 NOTE — H&P (Addendum)
[facility] NAME: Wesley [name]    MR#:  [id number]  DATE OF BIRTH:  27-Nov-1995  DATE OF ADMISSION:  07/11/2018  PRIMARY CARE PHYSICIAN: Patient, No Pcp Per   REQUESTING/REFERRING PHYSICIAN: Nance Pear, MD  CHIEF COMPLAINT:   Chief Complaint  Patient presents with  . Hyperglycemia    HISTORY OF PRESENT ILLNESS:  Wesley [name]  is a 22 y.o. male with a known history of type 1 diabetes who presented to the ED with nausea, vomiting, diarrhea, and generalized abdominal pain that started this morning. The abdominal pain is located throughout his whole abdomen and feels "sharp". He has vomited multiple times. No sick contacts. He checked his blood sugar today and it was in the 400s. He took his insulin as prescribed and his blood sugar did not improve. His blood sugars were in the 100s yesterday. He has not missed any doses of insulin. He denies fevers and chills.  In the ED, he was tachycardic with HRs in the low 100s. He was tachypneic. Labs were significant for K 7.4, glucose 524, Cr 1.40, anion gap 25, WBC 11.7. UA was unremarkable. He was started on an insulin drip for DKA. Hospitalists were called for admission.  PAST MEDICAL HISTORY:   Past Medical History:  Diagnosis Date  . Diabetes mellitus without complication ([place])    type 1 diabetes    PAST SURGICAL HISTORY:   Past Surgical History:  Procedure Laterality Date  . HAND SURGERY    . INCISION AND DRAINAGE ABSCESS N/A 10/02/2015   Procedure: INCISION AND DRAINAGE ABSCESS;  Surgeon: Festus Aloe, MD;  Location: [facility] ORS;  Service: Urology;  Laterality: N/A;  . TONSILLECTOMY    . TONSILLECTOMY AND ADENOIDECTOMY    . UMBILICAL HERNIA REPAIR      SOCIAL HISTORY:   Social History   Tobacco Use  . Smoking status: Current Some Day Smoker    Packs/day: 0.50    Types: Cigarettes  . Smokeless tobacco: Never Used  Substance Use Topics  . Alcohol use: Yes      Comment: occasional    FAMILY HISTORY:   Family History  Problem Relation Age of Onset  . Diabetes Mellitus II Paternal Grandfather   . Hypertension Mother     DRUG ALLERGIES:  No Known Allergies  REVIEW OF SYSTEMS:   Review of Systems  Constitutional: Negative for chills and fever.  HENT: Negative for congestion and sore throat.   Eyes: Negative for blurred vision and double vision.  Respiratory: Negative for cough and shortness of breath.   Cardiovascular: Negative for chest pain, palpitations and leg swelling.  Gastrointestinal: Positive for abdominal pain, diarrhea, nausea and vomiting.  Genitourinary: Negative for dysuria and frequency.  Musculoskeletal: Negative for back pain and neck pain.  Neurological: Negative for dizziness, weakness and headaches.  Psychiatric/Behavioral: Negative for depression. The patient is not nervous/anxious.     MEDICATIONS AT HOME:   Prior to Admission medications   Medication Sig Start Date End Date Taking? Authorizing Provider  blood glucose meter kit and supplies KIT Dispense based on patient and insurance preference. Use up to four times daily as directed. (FOR ICD-9 250.00, 250.01). 03/21/17  Yes Gladstone Lighter, MD  insulin NPH Human (HUMULIN N,NOVOLIN N) 100 UNIT/ML injection Inject 12 Units into the skin 2 (two) times daily.   Yes [provider]  insulin regular (NOVOLIN R,HUMULIN R) 100 units/mL injection Inject 5 Units into the skin 3 (three)  times daily before meals.   Yes [provider]      VITAL SIGNS:  Blood pressure 123/77, pulse (!) 109, temperature 97.9 F (36.6 C), temperature source Oral, resp. rate (!) 25, height 5' 10" (1.778 m), SpO2 97 %.  PHYSICAL EXAMINATION:  Physical Exam  GENERAL:  22 y.o.-year-old patient lying in the bed with no acute distress. Sleepy but easily arousable. EYES: Pupils equal, round, reactive to light and accommodation. No scleral icterus. Extraocular muscles  intact.  HEENT: Head atraumatic, normocephalic. Oropharynx and nasopharynx clear. Dry mucous membranes. NECK:  Supple, no jugular venous distention. No thyroid enlargement, no tenderness.  LUNGS: Normal breath sounds bilaterally, no wheezing, rales,rhonchi or crepitation. No use of accessory muscles of respiration.  CARDIOVASCULAR: Tachycardic, regular rhythm, S1, S2 normal. No murmurs, rubs, or gallops.  ABDOMEN: Soft, nondistended. +generalized tenderness to palpation. No rebound or guarding. Bowel sounds present. No organomegaly or mass.  EXTREMITIES: No pedal edema, cyanosis, or clubbing.  NEUROLOGIC: Cranial nerves II through XII are intact. Muscle strength 5/5 in all extremities. Sensation intact. Gait not checked.  PSYCHIATRIC: The patient is alert and oriented x 3.  SKIN: No obvious rash, lesion, or ulcer.   LABORATORY PANEL:   CBC Recent Labs  Lab 07/11/18 1755  WBC 11.7*  HGB 17.0  HCT 50.1  PLT 277   ------------------------------------------------------------------------------------------------------------------  Chemistries  Recent Labs  Lab 07/11/18 1755  NA 134*  K 7.1*  CL 101  CO2 8*  GLUCOSE 524*  BUN 23*  CREATININE 1.40*  CALCIUM 9.6   ------------------------------------------------------------------------------------------------------------------  Cardiac Enzymes No results for input(s): TROPONINI in the last 168 hours. ------------------------------------------------------------------------------------------------------------------  RADIOLOGY:  No results found.    IMPRESSION AND PLAN:   Moderate DKA with a history of type 1 diabetes- states he is taking his insulin as prescribed.  - continue insulin drip - fluids per DKA protocol - BMP every 4 hours - CBG every hour - check a1c  Nausea/vomiting/abdominal pain- likely related to DKA. Gastroenteritis is also a possibility. - if no improvement in abdominal pain, would consider obtaining  CT abdomen/pelvis - if starts having diarrhea, can consider stool studies - zofran prn  Hyperkalemia- likely related to DKA. EKG without any changes. - will likely improve with insulin - recheck K now - if still markedly elevated, will order veltassa   AKI- Cr 1.40, baseline 0.8-1.0. Likely related to dehydration. - fluids per DKA protocol - avoid nephrotoxic agents  Leukocytosis- WBC 11.7 with elevated neutrophils. UA non-infectious. No fevers or other signs of infection. - check CXR - check procalcitonin  All the records are reviewed and case discussed with ED provider. Management plans discussed with the patient, family and they are in agreement.  CODE STATUS: Full  TOTAL TIME TAKING CARE OF THIS PATIENT: 45 minutes.    Berna Spare Mayo M.D on 07/11/2018 at 8:13 PM  Between 7am to 6pm - Pager - [phone]  After 6pm go to [email or web address] - Proofreader  [facility]  Office  [phone]  CC: Primary care physician; Patient, No Pcp Per   Note: This dictation was prepared with Dragon dictation along with smaller phrase technology. Any transcriptional errors that result from this process are unintentional.

## 2018-07-11 NOTE — ED Triage Notes (Signed)
Pt comes from home via [facility]. C/o n/v/d this morning. T1D. CBG 423 per EMS. He reports that he had insulin this morning but doesn't remember how much. HR 110. Abdominal pain and now c/o some chest pain.Fluids and zofran on EMS. 18g L AC. Has been in DKA before and states that it feels similar. No recent fever or illnesses.

## 2018-07-11 NOTE — ED Provider Notes (Signed)
[facility] Emergency Department Provider Note  ____________________________________________   I have reviewed the triage vital signs and the nursing notes.   HISTORY  Chief Complaint Nausea and vomiting  History limited by: Not Limited   HPI Wesley [name] is a 22 y.o. male who presents to the emergency department today because of concerns for nausea and vomiting.  Patient states that the symptoms woke him up from sleep this morning.  He has had multiple episodes of nonbloody vomiting.  Has been accompanied by some abdominal discomfort.  The patient states that he felt fine when he went to bed last night.  His sugars were running high today.  He did take insulin but is unaware of how much he took.  He states he has had DKA in the past and this reminds him of that.  States his sugars were normal yesterday.  Denies any recent illness.  Denies any recent lapse in taking his medication.    Per medical record review patient has a history of dka  Past Medical History:  Diagnosis Date  . Diabetes mellitus without complication ([place])    type 1 diabetes    Patient Active Problem List   Diagnosis Date Noted  . DKA (diabetic ketoacidoses) ([place]) 09/01/2016  . Sepsis ([place]) 10/02/2015  . Scrotal abscess   . Diabetes mellitus with ketoacidosis ([place]) 08/13/2012  . Type 1 diabetes mellitus ([place]) 02/06/2012    Past Surgical History:  Procedure Laterality Date  . HAND SURGERY    . INCISION AND DRAINAGE ABSCESS N/A 10/02/2015   Procedure: INCISION AND DRAINAGE ABSCESS;  Surgeon: Festus Aloe, MD;  Location: [facility] ORS;  Service: Urology;  Laterality: N/A;  . TONSILLECTOMY    . TONSILLECTOMY AND ADENOIDECTOMY    . UMBILICAL HERNIA REPAIR      Prior to Admission medications   Medication Sig Start Date End Date Taking? Authorizing Provider  blood glucose meter kit and supplies KIT Dispense based on patient and insurance preference. Use up to four times daily as  directed. (FOR ICD-9 250.00, 250.01). 03/21/17   Gladstone Lighter, MD  insulin aspart (NOVOLOG) 100 UNIT/ML injection Inject 5 Units into the skin 3 (three) times daily before meals. Per sliding scale 03/21/17   Gladstone Lighter, MD  insulin aspart protamine- aspart (NOVOLOG MIX 70/30) (70-30) 100 UNIT/ML injection Inject 0.2-0.4 mLs (20-40 Units total) into the skin 2 (two) times daily with a meal. Inject 40 units qam and 30 qhs 03/21/17   Gladstone Lighter, MD  metoCLOPramide (REGLAN) 10 MG tablet Take 1 tablet (10 mg total) by mouth 3 (three) times daily before meals. 03/21/17 05/20/17  Gladstone Lighter, MD  prochlorperazine (COMPAZINE) 10 MG tablet Take 1 tablet (10 mg total) by mouth every 8 (eight) hours as needed for nausea or vomiting. 03/21/17 03/21/18  Gladstone Lighter, MD    Allergies Patient has no known allergies.  Family History  Problem Relation Age of Onset  . Diabetes Mellitus II Paternal Grandfather   . Hypertension Mother     Social History Social History   Tobacco Use  . Smoking status: Current Some Day Smoker    Packs/day: 0.50    Types: Cigarettes  . Smokeless tobacco: Never Used  Substance Use Topics  . Alcohol use: Yes    Comment: occasional  . Drug use: No    Review of Systems Constitutional: No fever/chills Eyes: No visual changes. ENT: No sore throat. Cardiovascular: Denies chest pain. Respiratory: Denies shortness of breath. Gastrointestinal: Positive for abdominal pain  nausea vomiting Genitourinary: Negative for dysuria. Musculoskeletal: Negative for back pain. Skin: Negative for rash. Neurological: Negative for headaches, focal weakness or numbness.  ____________________________________________   PHYSICAL EXAM:  VITAL SIGNS: ED Triage Vitals  Enc Vitals Group     BP 07/11/18 1754 126/71     Pulse Rate 07/11/18 1754 (!) 109     Resp 07/11/18 1754 (!) 30     Temp 07/11/18 1754 97.9 F (36.6 C)     Temp Source 07/11/18 1754 Oral     SpO2  07/11/18 1754 97 %     Weight --      Height 07/11/18 1756 '5\' 10"'  (1.778 m)     Head Circumference --      Peak Flow --      Pain Score 07/11/18 1755 8   Constitutional: Alert and oriented.  Eyes: Conjunctivae are normal.  ENT      Head: Normocephalic and atraumatic.      Nose: No congestion/rhinnorhea.      Mouth/Throat: Mucous membranes are moist.      Neck: No stridor. Hematological/Lymphatic/Immunilogical: No cervical lymphadenopathy. Cardiovascular: Tachycardic, regular rhythm.  No murmurs, rubs, or gallops. Respiratory: Normal respiratory effort without tachypnea nor retractions. Breath sounds are clear and equal bilaterally. No wheezes/rales/rhonchi. Gastrointestinal: Soft and non tender. No rebound. No guarding.  Genitourinary: Deferred Musculoskeletal: Normal range of motion in all extremities. No lower extremity edema. Neurologic:  Normal speech and language. No gross focal neurologic deficits are appreciated.  Skin:  Skin is warm, dry and intact. No rash noted. Psychiatric: Mood and affect are normal. Speech and behavior are normal. Patient exhibits appropriate insight and judgment.  ____________________________________________    LABS (pertinent positives/negatives)  UA ketones 80 CBC wbc 11.7, hgb 17.0, plt 277 BMP na 134, k 7.1, glu 524, anion gap 25  ____________________________________________   EKG  I, Nance Pear, attending physician, personally viewed and interpreted this EKG  EKG Time: 1752 Rate: 110 Rhythm: sinus tachycardia Axis: normal Intervals: qtc 443 QRS: narrow ST changes: peaked t waves v3, v4 Impression: abnormal ekg   ____________________________________________    RADIOLOGY  None  ____________________________________________   PROCEDURES  Procedures  CRITICAL CARE Performed by: Nance Pear   Total critical care time: 35 minutes  Critical care time was exclusive of separately billable procedures and treating  other patients.  Critical care was necessary to treat or prevent imminent or life-threatening deterioration.  Critical care was time spent personally by me on the following activities: development of treatment plan with patient and/or surrogate as well as nursing, discussions with consultants, evaluation of patient's response to treatment, examination of patient, obtaining history from patient or surrogate, ordering and performing treatments and interventions, ordering and review of laboratory studies, ordering and review of radiographic studies, pulse oximetry and re-evaluation of patient's condition.  ____________________________________________   INITIAL IMPRESSION / ASSESSMENT AND PLAN / ED COURSE  Pertinent labs & imaging results that were available during my care of the patient were reviewed by me and considered in my medical decision making (see chart for details).   Patient presented to the emergency department today because of concerns for nausea vomiting high blood sugar.  Patient does have history of type 1 diabetes.  Patient states he has had DKA in the past.  Certainly this would be my concern.  Blood work is consistent with DKA with ketones in the urine.  Additionally patient's potassium was high and there was some peak T waves on the EKG.  Will  give patient calcium as well as expect the insulin given for the DKA to help with potassium.  Will plan on admission.  Discussed findings with patient.  ____________________________________________   FINAL CLINICAL IMPRESSION(S) / ED DIAGNOSES  Final diagnoses:  Diabetic ketoacidosis without coma associated with type 1 diabetes mellitus ([place])     Note: This dictation was prepared with Dragon dictation. Any transcriptional errors that result from this process are unintentional     Nance Pear, MD 07/11/18 1946

## 2018-07-11 NOTE — ED Notes (Signed)
Family member at bedside.

## 2018-07-11 NOTE — ED Notes (Signed)
Pt given urinal per request.

## 2018-07-11 NOTE — Consult Note (Addendum)
Name: Wesley [name] MRN: [id number] DOB: 08/04/1996    LOS: 0  Referring Provider:  Dr Brett Albino Reason for Referral:  DKA  PULMONARY / CRITICAL CARE MEDICINE  HPI: 22 year old African-American male with a history of type 1 diabetes presenting from home with hypoglycemia, nausea and vomiting x1 day.  States the symptoms started today.  At the ED, his blood glucose level was 524 with a potassium of 7.1 and creatinine of 1.4.  He was given 1 g of calcium gluconate and started on insulin drip.  He is being admitted to the ICU for further management. He is complaining of left upper quadrant abdominal pain that is stated he sharp and constant.  Pain is worse with deep breaths.  He cannot identify any alleviating factors.  He rates the pain as 10 on 10.  He denies having similar symptoms in the past.  He denies dyspnea, chest pain, dizziness, but reports a headache and generalized weakness  Past Medical History:  Diagnosis Date  . Diabetes mellitus without complication ([place])    type 1 diabetes   Past Surgical History:  Procedure Laterality Date  . HAND SURGERY    . INCISION AND DRAINAGE ABSCESS N/A 10/02/2015   Procedure: INCISION AND DRAINAGE ABSCESS;  Surgeon: Festus Aloe, MD;  Location: [facility] ORS;  Service: Urology;  Laterality: N/A;  . TONSILLECTOMY    . TONSILLECTOMY AND ADENOIDECTOMY    . UMBILICAL HERNIA REPAIR     Prior to Admission medications   Medication Sig Start Date End Date Taking? Authorizing Provider  blood glucose meter kit and supplies KIT Dispense based on patient and insurance preference. Use up to four times daily as directed. (FOR ICD-9 250.00, 250.01). 03/21/17  Yes Gladstone Lighter, MD  insulin NPH Human (HUMULIN N,NOVOLIN N) 100 UNIT/ML injection Inject 12 Units into the skin 2 (two) times daily.   Yes [provider]  insulin regular (NOVOLIN R,HUMULIN R) 100 units/mL injection Inject 5 Units into the skin 3 (three) times daily before meals.   Yes  [provider]   Allergies No Known Allergies  Family History Family History  Problem Relation Age of Onset  . Diabetes Mellitus II Paternal Grandfather   . Hypertension Mother    Social History  reports that he has been smoking cigarettes. He has been smoking about 0.50 packs per day. He has never used smokeless tobacco. He reports that he drinks alcohol. He reports that he does not use drugs.  Review Of Systems: All systems reviewed.  Pertinent positives include generalized weakness, left upper quadrant abdominal pain, and headache.  All other systems are negative   Vital Signs: Temp:  [97.9 F (36.6 C)-98.1 F (36.7 C)] 98.1 F (36.7 C) (09/29 2124) Pulse Rate:  [109-118] 118 (09/29 2124) Resp:  [20-30] 28 (09/29 2124) BP: (120-138)/(71-88) 131/88 (09/29 2124) SpO2:  [96 %-97 %] 96 % (09/29 2124) Weight:  [67.5 kg] 67.5 kg (09/29 2124)  Physical Examination: General: Moderate to severe distress Neuro: Alert and oriented x4, no focal deficits HEENT: PERRLA, no JVD Cardiovascular: Apical pulse tachycardic, S1-S2, no murmur regurg or gallop, +2 pulses bilaterally, no edema Lungs: Bilateral breath sounds, no wheezes or rhonchi, palpation of left chest wall reveals point tenderness in left rib cage Abdomen: Nondistended, normal bowel sounds in all 4 quadrants Musculoskeletal: Positive range of motion in upper and lower extremities no joint deformities Skin: Warm and dry   PULMONARY Recent Labs  Lab 07/11/18 2043  PHART 7.04*  PCO2ART 20*  PO2ART  105  HCO3 5.4*  O2SAT 94.6    RENAL Recent Labs  Lab 07/11/18 1755 07/11/18 1953  NA 134*  --   K 7.1* 7.4*  CL 101  --   CO2 8*  --   BUN 23*  --   CREATININE 1.40*  --   CALCIUM 9.6  --    Intake/Output    None     Recent Labs  Lab 07/11/18 1755  HGB 17.0  HCT 50.1  PLT 277    INFECTIOUS Recent Labs  Lab 07/11/18 1755  WBC 11.7*    ENDOCRINE Recent Labs  Lab 07/11/18 1755  07/11/18 1912 07/11/18 2049 07/11/18 2128  GLUCAP 506* 558* 384* 299*   ASSESSMENT AND PLAN DKA: Management per ICU DKA protocol Hyperkalemia: Bicarb infusion, calcium gluconate given in the ED, continue insulin infusion, and trend potassium level.  Aggressively treat potassium level for any EKG changes.  Last EKG with no acute changes. Acidosis secondary to DKA: Bicarb infusion and repeat ABG PRN Acute renal failure: Trend creatinine continue IV hydration Abdominal pain with leukocytosis and elevated procalcitonin: Unclear source of infection.  We will start empirically on Zosyn.  BEST PRACTICE / DISPOSITION Level of Care: ICU Code Status: Full code Diet: N.p.o. with sips with meds DVT Px: Lovenox GI Px: Not indicated   Drayven Marchen[facility] ANP-BC Pulmonary and Critical Care Medicine [facility] Pager [phone] or [phone]  NB: This document was prepared using Dragon voice recognition software and may include unintentional dictation errors.   07/11/2018, 10:37 PM

## 2018-07-12 LAB — BASIC METABOLIC PANEL
ANION GAP: 18 — AB (ref 5–15)
ANION GAP: 18 — AB (ref 5–15)
ANION GAP: 18 — AB (ref 5–15)
ANION GAP: 13 (ref 5–15)
BUN: 18 mg/dL (ref 6–20)
BUN: 16 mg/dL (ref 6–20)
BUN: 14 mg/dL (ref 6–20)
BUN: 12 mg/dL (ref 6–20)
CHLORIDE: 111 mmol/L (ref 98–111)
CO2: 13 mmol/L — ABNORMAL LOW (ref 22–32)
CO2: 14 mmol/L — ABNORMAL LOW (ref 22–32)
CO2: 16 mmol/L — ABNORMAL LOW (ref 22–32)
CO2: 19 mmol/L — AB (ref 22–32)
Calcium: 9.2 mg/dL (ref 8.9–10.3)
Calcium: 9.3 mg/dL (ref 8.9–10.3)
Calcium: 9.3 mg/dL (ref 8.9–10.3)
Calcium: 9.1 mg/dL (ref 8.9–10.3)
Chloride: 109 mmol/L (ref 98–111)
Chloride: 105 mmol/L (ref 98–111)
Chloride: 105 mmol/L (ref 98–111)
Creatinine, Ser: 1.24 mg/dL (ref 0.61–1.24)
Creatinine, Ser: 1.08 mg/dL (ref 0.61–1.24)
Creatinine, Ser: 1.15 mg/dL (ref 0.61–1.24)
Creatinine, Ser: 1.14 mg/dL (ref 0.61–1.24)
GFR calc Af Amer: >60 mL/min (ref >60)
GFR calc Af Amer: >60 mL/min (ref >60)
GFR calc Af Amer: >60 mL/min (ref >60)
GLUCOSE: 152 mg/dL — AB (ref 70–99)
Glucose, Bld: 157 mg/dL — ABNORMAL HIGH (ref 70–99)
Glucose, Bld: 167 mg/dL — ABNORMAL HIGH (ref 70–99)
Glucose, Bld: 206 mg/dL — ABNORMAL HIGH (ref 70–99)
POTASSIUM: 4.9 mmol/L (ref 3.5–5.1)
POTASSIUM: 4.4 mmol/L (ref 3.5–5.1)
POTASSIUM: 4.2 mmol/L (ref 3.5–5.1)
Potassium: 4.1 mmol/L (ref 3.5–5.1)
SODIUM: 142 mmol/L (ref 135–145)
SODIUM: 141 mmol/L (ref 135–145)
SODIUM: 139 mmol/L (ref 135–145)
Sodium: 137 mmol/L (ref 135–145)

## 2018-07-12 LAB — GLUCOSE, CAPILLARY
GLUCOSE-CAPILLARY: 118 mg/dL — AB (ref 70–99)
GLUCOSE-CAPILLARY: 120 mg/dL — AB (ref 70–99)
GLUCOSE-CAPILLARY: 137 mg/dL — AB (ref 70–99)
GLUCOSE-CAPILLARY: 140 mg/dL — AB (ref 70–99)
GLUCOSE-CAPILLARY: 151 mg/dL — AB (ref 70–99)
GLUCOSE-CAPILLARY: 153 mg/dL — AB (ref 70–99)
GLUCOSE-CAPILLARY: 159 mg/dL — AB (ref 70–99)
GLUCOSE-CAPILLARY: 168 mg/dL — AB (ref 70–99)
GLUCOSE-CAPILLARY: 170 mg/dL — AB (ref 70–99)
GLUCOSE-CAPILLARY: 194 mg/dL — AB (ref 70–99)
Glucose-Capillary: 115 mg/dL — ABNORMAL HIGH (ref 70–99)
Glucose-Capillary: 141 mg/dL — ABNORMAL HIGH (ref 70–99)
Glucose-Capillary: 165 mg/dL — ABNORMAL HIGH (ref 70–99)
Glucose-Capillary: 163 mg/dL — ABNORMAL HIGH (ref 70–99)
Glucose-Capillary: 183 mg/dL — ABNORMAL HIGH (ref 70–99)
Glucose-Capillary: 175 mg/dL — ABNORMAL HIGH (ref 70–99)
Glucose-Capillary: 214 mg/dL — ABNORMAL HIGH (ref 70–99)
Glucose-Capillary: 175 mg/dL — ABNORMAL HIGH (ref 70–99)
Glucose-Capillary: 214 mg/dL — ABNORMAL HIGH (ref 70–99)
Glucose-Capillary: 108 mg/dL — ABNORMAL HIGH (ref 70–99)
Glucose-Capillary: 85 mg/dL (ref 70–99)

## 2018-07-12 LAB — POTASSIUM: Potassium: 7.4 mmol/L (ref 3.5–5.1)

## 2018-07-12 LAB — PROCALCITONIN: Procalcitonin: 2.8 ng/mL

## 2018-07-12 LAB — HEMOGLOBIN A1C
HEMOGLOBIN A1C: 12.5 % — AB (ref 4.8–5.6)
Mean Plasma Glucose: 312.05 mg/dL

## 2018-07-12 MED ORDER — INSULIN ASPART 100 UNIT/ML ~~LOC~~ SOLN
4.0000 [IU] | Freq: Three times a day (TID) | SUBCUTANEOUS | Status: DC
  Administered 2018-07-13: 4 [IU] via SUBCUTANEOUS
  Filled 2018-07-12: qty 1

## 2018-07-12 MED ORDER — INSULIN ASPART 100 UNIT/ML ~~LOC~~ SOLN: 0.0000 [IU] | Freq: Every day | SUBCUTANEOUS | Status: DC

## 2018-07-12 MED ORDER — INSULIN DETEMIR 100 UNIT/ML ~~LOC~~ SOLN
6.0000 [IU] | Freq: Once | SUBCUTANEOUS | Status: AC
  Administered 2018-07-12: 6 [IU] via SUBCUTANEOUS
  Filled 2018-07-12: qty 0.06

## 2018-07-12 MED ORDER — PIPERACILLIN-TAZOBACTAM 3.375 G IVPB: 3.3750 g | Freq: Three times a day (TID) | INTRAVENOUS | Status: DC

## 2018-07-12 MED ORDER — INSULIN ASPART 100 UNIT/ML ~~LOC~~ SOLN
0.0000 [IU] | Freq: Three times a day (TID) | SUBCUTANEOUS | Status: DC
  Administered 2018-07-13: 2 [IU] via SUBCUTANEOUS
  Filled 2018-07-12: qty 1

## 2018-07-12 MED ORDER — INSULIN DETEMIR 100 UNIT/ML ~~LOC~~ SOLN
12.0000 [IU] | Freq: Two times a day (BID) | SUBCUTANEOUS | Status: DC
  Administered 2018-07-12: 12 [IU] via SUBCUTANEOUS
  Filled 2018-07-12 (×3): qty 0.12

## 2018-07-12 NOTE — Progress Notes (Addendum)
BMET from 1340 shows acidosis is improving: CO2 up to 19 Anion Gap down to 13  See Diabetes Coordinator note from earlier this AM for details of conversation with patient.  MD- If patient not having any nausea at this point, may be OK to go ahead and transition to SQ Insulin--Recommend the following:  1. Start Levemir 12 units BID (make sure to give 1st dose at least 1 hour prior to d/c of IV Insulin drip) (Pharmacy will substitute Levemir for NPH)  2. Start Novolog Sensitive Correction Scale/ SSI (0-9 units) TID AC + HS  3. Start Novolog Meal Coverage: Novolog 4 units TID with meals  (Please add the following Hold Parameters: Hold if pt eats <50% of meal, Hold if pt NPO)     --Will follow patient during hospitalization--  Ambrose Finland RN, MSN, CDE Diabetes Coordinator Inpatient Glycemic Control Team Team Pager: [phone] (8a-5p)

## 2018-07-12 NOTE — Progress Notes (Signed)
Initial Nutrition Assessment  DOCUMENTATION CODES:   Not applicable  INTERVENTION:  Will monitor PO intake as diet advances.  Encouraged adequate intake of protein at meals once diet able to be advanced.  NUTRITION DIAGNOSIS:   Inadequate oral intake related to acute illness(DKA, N/V) as evidenced by per patient/family report.  GOAL:   Patient will meet greater than or equal to 90% of their needs  MONITOR:   PO intake, Diet advancement, Labs, Weight trends, I & O's  REASON FOR ASSESSMENT:   Malnutrition Screening Tool    ASSESSMENT:   22 year old male with PMHx of DM type 1 admitted with N/V, DKA, hyperkalemia, acute renal failure.   Met with patient at bedside. He reports his appetite has been decreased for a few days with N/V but that has improved now. He is concerned with weight loss over the past several years, but he reports usually his appetite and intake is very good. He eats 3 meals per day and snacks between meals. Breakfast is usually bacon or sausage with eggs and toast. Lunch is ravioli or noodles. Dinner is meat with vegetables and rice. He snacks on chips or cheese and crackers. Drinks water and occasionally juice during the day. Patient reports he has had diabetes since he was 6. He is very comfortable with counting carbohydrates and dosing his insulin (uses insulin:CHO ratio and sliding scale). He reports very good glycemic control usually. UBW was 195 lbs approximately 3 years ago and he has had slow weight loss since then. After discussion about lifestyle changes and other factors that may impact weight loss, patient reports that the previous weight was when he was playing football in high school and lifting weights every day. He has since stopped lifting weights and has lost some of his muscle mass. Still adequate muscle found on NFPE.  Medications reviewed and include: D5-1/2NS at 125 mL/hr, insulin gtt.  Labs reviewed: CBG 118-214, CO2 19.  Patient does not  meet criteria for malnutrition.  Discussed with RN and on rounds.   NUTRITION - FOCUSED PHYSICAL EXAM:    Most Recent Value  Orbital Region  No depletion  Upper Arm Region  No depletion  Thoracic and Lumbar Region  No depletion  Buccal Region  No depletion  Temple Region  No depletion  Clavicle Bone Region  Mild depletion  Clavicle and Acromion Bone Region  No depletion  Scapular Bone Region  No depletion  Dorsal Hand  No depletion  Patellar Region  No depletion  Anterior Thigh Region  No depletion  Posterior Calf Region  No depletion  Edema (RD Assessment)  None  Hair  Reviewed  Eyes  Reviewed  Mouth  Reviewed  Skin  Reviewed  Nails  Reviewed     Diet Order:   Diet Order            Diet clear liquid Room service appropriate? Yes; Fluid consistency: Thin  Diet effective now              EDUCATION NEEDS:   Education needs have been addressed  Skin:  Skin Assessment: Reviewed RN Assessment  Last BM:  07/11/2018  Height:   Ht Readings from Last 1 Encounters:  07/11/18 _0  (1.753 m)    Weight:   Wt Readings from Last 1 Encounters:  07/11/18 67.5 kg    Ideal Body Weight:  72.7 kg  BMI:  Body mass index is 21.98 kg/m.  Estimated Nutritional Needs:   Kcal:  6168-3729  Protein:  80-100 grams  Fluid:  2-2.3 L/day  Willey Blade, MS, RD, LDN Office: [phone] Pager: [phone] After Hours/Weekend Pager: [phone]

## 2018-07-12 NOTE — Progress Notes (Addendum)
Inpatient Diabetes Program Recommendations  AACE/ADA: New Consensus Statement on Inpatient Glycemic Control (2015)  Target Ranges:  Prepandial:   less than 140 mg/dL      Peak postprandial:   less than 180 mg/dL (1-2 hours)      Critically ill patients:  140 - 180 mg/dL   Results for KHYREN, HING (MRN [id number]) as of 07/12/2018 08:17  Ref. Range 07/11/2018 17:55  Sodium Latest Ref Range: 135 - 145 mmol/L 134 (L)  Potassium Latest Ref Range: 3.5 - 5.1 mmol/L 7.1 (HH)  Chloride Latest Ref Range: 98 - 111 mmol/L 101  CO2 Latest Ref Range: 22 - 32 mmol/L 8 (L)  Glucose Latest Ref Range: 70 - 99 mg/dL 524 (HH)  BUN Latest Ref Range: 6 - 20 mg/dL 23 (H)  Creatinine Latest Ref Range: 0.61 - 1.24 mg/dL 1.40 (H)  Calcium Latest Ref Range: 8.9 - 10.3 mg/dL 9.6  Anion gap Latest Ref Range: 5 - 15  25 (H)    Admit with: DKA  History: Type 1 DM  Home DM Meds: NPH Insulin 12 units BID       Regular Insulin 5 units TID with meals  Current Orders: IV Insulin Drip     MD- BMET from 5:25am today shows pt not ready to transition to SQ insulin yet (CO2 only up to 14 and Anion Gap still elevated at 18).  Per DKA Protocol, keep patient on IV Insulin drip until CO2 at least 20 and Anion Gap less than 12.  When patient is ready to transition, patient will need basal insulin 1-2 hours prior to d/c of IV Insulin drip.  Since he takes NPH at home, we can use Levemir as a substitute.  Recommend he get at least 80% total home dose of basal insulin as Levemir (Levemir 20 units Daily) + Novolog Sensitive (0-9 units) SSI + Novolog 4 units TID with meals (Meal Coverage).      Last visit with Pediatric Endocrinologist was 12/31/2016 (Dr. Herbie Baltimore with [facility] in [place], [place]).  Current PCP listed as [facility] in [place].  Back in 2018 was taking the following due to lack of health insurance: Relion 70/30 Insulin- 50 units with breakfast and 25 units with  Dinner  Relion Regular Insulin per the following SSI: <150 0 units 151-200 1 units  201-250 2 units 251-300 3 units 301-350 4 units  351-400 5 units >400 6 units  Looks like patient saw Roxy Horseman, PA with [facility] on 06/11/2018 and was going to be switched to Lantus and Humalog, however, per the notes from [place], pt has a very high deductible with his Mom's insurance ($4000) and the decision was made to have patient take the following: NPH Insulin 12 units BID Regular Insulin 5 units TID with meals    Addendum 12pm- Met with pt today.  Was diagnosed with diabetes at age 22.  Pt told me he goes to the [facility] and also goes to [facility].  Told me that the staff at [facility] are planning to make a referral for him to get a new Endocrinologist with [facility] health care.  Has access to insulin.  Plans to buy the NPH and Regular insulins at [facility] for $25.  Has CBG meter and supplies at home.  Seemed frustrated that his A1c was so high this admission.  Also discussed with patient diagnosis of DKA (pathophysiology), treatment of DKA, lab results, and transition plan to SQ  insulin regimen.  Spoke with patient about his current A1c of 12.5%.  Explained what an A1c is and what it measures.  Reminded patient that his goal A1c is 7% or less per ADA standards to prevent both acute and long-term complications.  Explained to patient the extreme importance of good glucose control at home.  Encouraged patient to check his CBGs at least QID and to record all CBGs in a logbook for his PCP or Endocrinologist to review. Patient appreciative of my visit and stated he has no further questions at this time.       --Will follow patient during hospitalization--  Wyn Quaker RN, MSN, CDE Diabetes Coordinator Inpatient Glycemic Control Team Team Pager: [phone] (8a-5p)

## 2018-07-12 NOTE — Progress Notes (Signed)
Pharmacy Antibiotic Note  Wesley [name] is a 22 y.o. male admitted on 07/11/2018 with intra-abdominal source.  Pharmacy has been consulted for zosyn dosing.  Plan: Zosyn 3.375g IV q8h (4 hour infusion).  Height: 5\' 9"  (175.3 cm) Weight: 148 lb 13 oz (67.5 kg) IBW/kg (Calculated) : 70.7  Temp (24hrs), Avg:97.8 F (36.6 C), Min:97.4 F (36.3 C), Max:98.1 F (36.7 C)  Recent Labs  Lab 07/11/18 1755 07/11/18 2211 07/12/18 0111 07/12/18 0525  WBC 11.7* 22.1*  --   --   CREATININE 1.40* 1.41* 1.24 1.08    Estimated Creatinine Clearance: 103.3 mL/min (by C-G formula based on SCr of 1.08 mg/dL).    No Known Allergies   Thank you for allowing pharmacy to be a part of this patient's care.  Thomasene Ripple, PharmD, BCPS Clinical Pharmacist 07/12/2018

## 2018-07-12 NOTE — Care Management (Signed)
[facility] is followed by [facility] for medications and PCP. Per RN, he is requesting information regarding opioid clinic; CSW updated via text message.

## 2018-07-12 NOTE — Progress Notes (Signed)
[facility] [place] [facility]   PATIENT NAME: Wesley [name]    MR#:  [id number]  DATE OF BIRTH:  10/06/1996  SUBJECTIVE:  CHIEF COMPLAINT:   Chief Complaint  Patient presents with  . Hyperglycemia   -Patient with type 1 diabetes mellitus, admitted with DKA, -Feels much better this morning.  Remains on insulin drip  REVIEW OF SYSTEMS:  Review of Systems  Constitutional: Negative for chills, fever and malaise/fatigue.  HENT: Negative for congestion, ear discharge, hearing loss and nosebleeds.   Eyes: Negative for blurred vision and double vision.  Respiratory: Negative for cough, shortness of breath and wheezing.   Cardiovascular: Negative for chest pain, palpitations and leg swelling.  Gastrointestinal: Negative for abdominal pain, constipation, diarrhea, heartburn, nausea and vomiting.  Genitourinary: Negative for dysuria.  Musculoskeletal: Negative for myalgias.  Neurological: Negative for dizziness, focal weakness, seizures, weakness and headaches.  Psychiatric/Behavioral: Negative for depression.    DRUG ALLERGIES:  No Known Allergies  VITALS:  Blood pressure (!) 126/94, pulse (!) 105, temperature 99 F (37.2 C), temperature source Oral, resp. rate (!) 24, height 5\' 9"  (1.753 m), weight 67.5 kg, SpO2 100 %.  PHYSICAL EXAMINATION:  Physical Exam  GENERAL:  22 y.o.-year-old patient lying in the bed with no acute distress.  EYES: Pupils equal, round, reactive to light and accommodation. No scleral icterus. Extraocular muscles intact.  HEENT: Head atraumatic, normocephalic. Oropharynx and nasopharynx clear.  NECK:  Supple, no jugular venous distention. No thyroid enlargement, no tenderness.  LUNGS: Normal breath sounds bilaterally, no wheezing, rales,rhonchi or crepitation. No use of accessory muscles of respiration.  CARDIOVASCULAR: S1, S2 normal. No murmurs, rubs, or gallops.  ABDOMEN: Soft, nontender, nondistended. Bowel sounds present. No  organomegaly or mass.  EXTREMITIES: No pedal edema, cyanosis, or clubbing.  NEUROLOGIC: Cranial nerves II through XII are intact. Muscle strength 5/5 in all extremities. Sensation intact. Gait not checked.  PSYCHIATRIC: The patient is alert and oriented x 3.  SKIN: No obvious rash, lesion, or ulcer.    LABORATORY PANEL:   CBC Recent Labs  Lab 07/11/18 2211  WBC 22.1*  HGB 16.9  HCT 50.1  PLT 251   ------------------------------------------------------------------------------------------------------------------  Chemistries  Recent Labs  Lab 07/11/18 2211  07/12/18 1340  NA 139   < > 137  K 6.2*   < > 4.1  CL 111   < > 105  CO2 11*   < > 19*  GLUCOSE 289*   < > 206*  BUN 22*   < > 12  CREATININE 1.41*   < > 1.14  CALCIUM 9.2   < > 9.1  MG 2.3  --   --    < > = values in this interval not displayed.   ------------------------------------------------------------------------------------------------------------------  Cardiac Enzymes No results for input(s): TROPONINI in the last 168 hours. ------------------------------------------------------------------------------------------------------------------  RADIOLOGY:  Portable Chest X-ray (1 View)  Result Date: 07/11/2018 CLINICAL DATA:  Diabetic ketoacidosis EXAM: PORTABLE CHEST 1 VIEW COMPARISON:  12/27/2014 FINDINGS: The heart size and mediastinal contours are within normal limits. Both lungs are clear. The visualized skeletal structures are unremarkable. IMPRESSION: No active disease. Electronically Signed   By: Jasmine Pang M.D.   On: 07/11/2018 22:24    EKG:   Orders placed or performed during the hospital encounter of 07/11/18  . ED EKG  . ED EKG  . EKG 12-Lead  . EKG 12-Lead  . EKG 12-Lead  . EKG 12-Lead  . EKG 12-Lead  . EKG  12-Lead    ASSESSMENT AND PLAN:   22 year old male with past medical history significant for type 1 diabetes mellitus admitted to the hospital secondary to DKA  1.  DKA-started  on IV insulin drip -Anion gap is improving.  Wait until anion gap is closed then changed to Levemir and sliding scale insulin -Appreciate diabetes coordinator's input. -A1c is pending  2.  Hyperkalemia on admission-secondary to metabolic acidosis.  Much improved with fluids  3.  Acute renal failure on admission-improved with IV fluids now.  4.  Nausea and vomiting.  No intra-abdominal pathology.  Improving with resolving DKA -Reglan as needed  5.  DVT prophylaxis-Lovenox    All the records are reviewed and case discussed with Care Management/Social Workerr. Management plans discussed with the patient, family and they are in agreement.  CODE STATUS: Full code  TOTAL TIME TAKING CARE OF THIS PATIENT: 35 minutes.   POSSIBLE D/C IN 1-2 DAYS, DEPENDING ON CLINICAL CONDITION.   Loisann Roach M.D on 07/12/2018 at 3:00 PM  Between 7am to 6pm - Pager - [phone]  After 6pm go to [email or web address] - Social research officer, government  [facility] [place]  Office  [phone]  CC: Primary care physician; Center, [facility]

## 2018-07-13 LAB — PROCALCITONIN: PROCALCITONIN: 1.85 ng/mL

## 2018-07-13 LAB — BASIC METABOLIC PANEL
ANION GAP: 13 (ref 5–15)
BUN: 9 mg/dL (ref 6–20)
CALCIUM: 9.3 mg/dL (ref 8.9–10.3)
CO2: 21 mmol/L — AB (ref 22–32)
Chloride: 106 mmol/L (ref 98–111)
Creatinine, Ser: 0.84 mg/dL (ref 0.61–1.24)
GLUCOSE: 33 mg/dL — AB (ref 70–99)
POTASSIUM: 3.3 mmol/L — AB (ref 3.5–5.1)
Sodium: 140 mmol/L (ref 135–145)

## 2018-07-13 LAB — GLUCOSE, CAPILLARY
GLUCOSE-CAPILLARY: 187 mg/dL — AB (ref 70–99)
Glucose-Capillary: 208 mg/dL — ABNORMAL HIGH (ref 70–99)
Glucose-Capillary: 54 mg/dL — ABNORMAL LOW (ref 70–99)
Glucose-Capillary: 225 mg/dL — ABNORMAL HIGH (ref 70–99)
Glucose-Capillary: 166 mg/dL — ABNORMAL HIGH (ref 70–99)

## 2018-07-13 LAB — MAGNESIUM: Magnesium: 1.7 mg/dL (ref 1.7–2.4)

## 2018-07-13 LAB — HIV ANTIBODY (ROUTINE TESTING W REFLEX): HIV SCREEN 4TH GENERATION: NONREACTIVE

## 2018-07-13 MED ORDER — INSULIN DETEMIR 100 UNIT/ML ~~LOC~~ SOLN
8.0000 [IU] | Freq: Two times a day (BID) | SUBCUTANEOUS | Status: DC
  Administered 2018-07-13: 8 [IU] via SUBCUTANEOUS
  Filled 2018-07-13 (×3): qty 0.08

## 2018-07-13 MED ORDER — POTASSIUM CHLORIDE CRYS ER 20 MEQ PO TBCR
40.0000 meq | EXTENDED_RELEASE_TABLET | Freq: Once | ORAL | Status: AC
  Administered 2018-07-13: 40 meq via ORAL
  Filled 2018-07-13: qty 2

## 2018-07-13 MED ORDER — MAGNESIUM SULFATE 2 GM/50ML IV SOLN
2.0000 g | Freq: Once | INTRAVENOUS | Status: AC
  Administered 2018-07-13: 2 g via INTRAVENOUS
  Filled 2018-07-13: qty 50

## 2018-07-13 MED ORDER — INSULIN DETEMIR 100 UNIT/ML ~~LOC~~ SOLN: 12.0000 [IU] | Freq: Two times a day (BID) | SUBCUTANEOUS | 11 refills | Status: DC

## 2018-07-13 MED ORDER — DEXTROSE 50 % IV SOLN
25.0000 mL | Freq: Once | INTRAVENOUS | Status: AC
  Administered 2018-07-13: 25 mL via INTRAVENOUS
  Filled 2018-07-13: qty 50

## 2018-07-13 MED ORDER — INSULIN ASPART 100 UNIT/ML ~~LOC~~ SOLN: SUBCUTANEOUS | 3 refills | Status: DC

## 2018-07-13 NOTE — Progress Notes (Addendum)
Inpatient Diabetes Program Recommendations  AACE/ADA: New Consensus Statement on Inpatient Glycemic Control (2015)  Target Ranges:  Prepandial:   less than 140 mg/dL      Peak postprandial:   less than 180 mg/dL (1-2 hours)      Critically ill patients:  140 - 180 mg/dL   Results for Wesley [name], Wesley [name] (MRN [id number]) as of 07/13/2018 07:01  Ref. Range 07/12/2018 17:31 07/12/2018 18:35 07/12/2018 19:24 07/12/2018 20:29 07/12/2018 22:00  Glucose-Capillary Latest Ref Range: 70 - 99 mg/dL 409 (H)  6 units LEVEMIR 214 (H) 168 (H) 108 (H) 85  12 units LEVEMIR   Results for Wesley [name], Wesley [name] (MRN [id number]) as of 07/13/2018 07:01  Ref. Range 07/13/2018 06:54  Glucose-Capillary Latest Ref Range: 70 - 99 mg/dL 54 (L)    Admit with: DKA  History: Type 1 DM  Home DM Meds: NPH Insulin 12 units BID                             Regular Insulin 5 units TID with meals  Current Orders: Levemir 12 units BID      Novolog Sensitive Correction Scale/ SSI (0-9 units) TID AC + HS      Novolog 4 units TID with meals       Patient transitioned off the IV Insulin drip yesterday evening.  Was given 6 units Levemir at 6pm and another 12 units Levemir at 10pm.  Total of 18 units Levemir given last PM.  Severe Hypoglycemia this AM on BMET (glucose down to 33 mg/dl on 9:56OZ BMET--CBG by fingerstick at 7am was 54 mg/dl).      MD- Please consider the following in-hospital insulin adjustments:  1. Reduce Levemir to 8 units BID (~30% reduction of total dose)  2. Change PO diet to Carbohydrate Modified (currently ordered as Regular diet without Carbohydrate restrictions)     --Will follow patient during hospitalization--  Ambrose Finland RN, MSN, CDE Diabetes Coordinator Inpatient Glycemic Control Team Team Pager: [phone] (8a-5p)

## 2018-07-13 NOTE — Progress Notes (Signed)
Faxed medications prescriptions per [facility]. Patient to schedule follow up appointment with [facility]. Patient has hard copy of prescriptions. He had no further questions or concerns.

## 2018-07-13 NOTE — Progress Notes (Signed)
Dr Nemiah Commander in to evaluate patient. MD made aware of patients blood sugars this am. Held Novo log due to patients blood sugar and not eating breakfast. Patient did receive long acting insulin. Per MD patient will be discharged today. Care manager in to assess patients needs. Per MD removed tele and IV's. Awaiting for discharge instructions.

## 2018-07-13 NOTE — Care Management (Signed)
Patient for discharge today.  He confirms that he has pcp at [facility].  He informs CM that "one month I have medicaid and another I don't." Says he was taken off his mother's insurance. Asked patient when was the last time he was he was informed he did not have medicaid and said last month. Reached out to pre service center and confirm that he has no insurance.  Patient says he is applying for medicaid.  Provided patient with applications for [facility].  He says he has glucose monitor and supplies and is able to check his blood sugars.  Confirmed that [facility] has Novolog and Levemir insulins in vials.  Patient instructed to proceed across the street at discharge to obtain his insulin and he with girlfriend verbalize understanding.  He says he has been paying out of pocket for insulin at [facility].  He has not worked in the last 3-4 months.

## 2018-07-13 NOTE — Discharge Instructions (Signed)

## 2018-07-13 NOTE — Progress Notes (Signed)
Patient not eating this am held novolog due to patients blood sugar.

## 2018-07-14 LAB — HEMOGLOBIN A1C
Hgb A1c MFr Bld: 12.4 % — ABNORMAL HIGH (ref 4.8–5.6)
Mean Plasma Glucose: 309 mg/dL

## 2018-07-14 NOTE — Discharge Summary (Signed)
[place] at [place] NAME: Wesley [name]    MR#:  [id number]  DATE OF BIRTH:  04-Sep-1996  DATE OF ADMISSION:  07/11/2018   ADMITTING PHYSICIAN: Sela Hua, MD  DATE OF DISCHARGE: 07/13/2018  1:00 PM  PRIMARY CARE PHYSICIAN: [facility] [place]   ADMISSION DIAGNOSIS:   Diabetic ketoacidosis without coma associated with type 1 diabetes mellitus ([place]) [E10.10]  DISCHARGE DIAGNOSIS:   Active Problems:   DKA (diabetic ketoacidoses) ([place])   SECONDARY DIAGNOSIS:   Past Medical History:  Diagnosis Date  . Diabetes mellitus without complication ([place])    type 1 diabetes    HOSPITAL COURSE:   22 year old male with past medical history significant for type 1 diabetes mellitus admitted to the hospital secondary to DKA  1.  DKA- initially on IV insulin drip in stepdown -Anion gap is improved -Started on subcutaneous Levemir however at high dose, became hypoglycemic.  At home he does not check his fingersticks. -Being discharged on Levemir 12 units twice a day. Also NovoLog sliding scale. -Strongly encouraged to check his fingersticks and outpatient follow-up with PCP.-A1c is 12.5  2.  Hyperkalemia on admission-secondary to metabolic acidosis.  improved with fluids  3.  Acute renal failure on admission-improved with IV fluids .  4.  Nausea and vomiting.  No intra-abdominal pathology.  Improved with resolved DKA  Will be discharged home today  DISCHARGE CONDITIONS:   Guarded  CONSULTS OBTAINED:   None  DRUG ALLERGIES:   No Known Allergies DISCHARGE MEDICATIONS:   Allergies as of 07/13/2018   No Known Allergies     Medication List    STOP taking these medications   insulin NPH Human 100 UNIT/ML injection Commonly known as:  HUMULIN N,NOVOLIN N   insulin regular 100 units/mL injection Commonly known as:  NOVOLIN R,HUMULIN R     TAKE these medications   blood glucose meter kit and  supplies Kit Dispense based on patient and insurance preference. Use up to four times daily as directed. (FOR ICD-9 250.00, 250.01).   insulin aspart 100 UNIT/ML injection Commonly known as:  novoLOG If finger sticks <150- no novolog coverage, if between 151-200- use 2 units, if 201-250- use 4 units, if 251-300- use 6 units, if 301-351- use 8 units, if > 350- call MD   insulin detemir 100 UNIT/ML injection Commonly known as:  LEVEMIR Inject 0.12 mLs (12 Units total) into the skin 2 (two) times daily.        DISCHARGE INSTRUCTIONS:   1. PCP f/u in 1 week  DIET:   Diabetic diet  ACTIVITY:   Activity as tolerated  OXYGEN:   Home Oxygen: No.  Oxygen Delivery: room air  DISCHARGE LOCATION:   home   If you experience worsening of your admission symptoms, develop shortness of breath, life threatening emergency, suicidal or homicidal thoughts you must seek medical attention immediately by calling 911 or calling your MD immediately  if symptoms less severe.  You Must read complete instructions/literature along with all the possible adverse reactions/side effects for all the Medicines you take and that have been prescribed to you. Take any new Medicines after you have completely understood and accpet all the possible adverse reactions/side effects.   Please note  You were cared for by a hospitalist during your hospital stay. If you have any questions about your discharge medications or the care you received while you were in the hospital after you are discharged, you  can call the unit and asked to speak with the hospitalist on call if the hospitalist that took care of you is not available. Once you are discharged, your primary care physician will handle any further medical issues. Please note that NO REFILLS for any discharge medications will be authorized once you are discharged, as it is imperative that you return to your primary care physician (or establish a relationship with a  primary care physician if you do not have one) for your aftercare needs so that they can reassess your need for medications and monitor your lab values.    On the day of Discharge:  VITAL SIGNS:   Blood pressure 124/82, pulse 86, temperature 98.3 F (36.8 C), resp. rate 18, height _0  (1.753 m), weight 67.5 kg, SpO2 100 %.  PHYSICAL EXAMINATION:   GENERAL:  22 y.o.-year-old patient lying in the bed with no acute distress.  EYES: Pupils equal, round, reactive to light and accommodation. No scleral icterus. Extraocular muscles intact.  HEENT: Head atraumatic, normocephalic. Oropharynx and nasopharynx clear.  NECK:  Supple, no jugular venous distention. No thyroid enlargement, no tenderness.  LUNGS: Normal breath sounds bilaterally, no wheezing, rales,rhonchi or crepitation. No use of accessory muscles of respiration.  CARDIOVASCULAR: S1, S2 normal. No murmurs, rubs, or gallops.  ABDOMEN: Soft, nontender, nondistended. Bowel sounds present. No organomegaly or mass.  EXTREMITIES: No pedal edema, cyanosis, or clubbing.  NEUROLOGIC: Cranial nerves II through XII are intact. Muscle strength 5/5 in all extremities. Sensation intact. Gait not checked.  PSYCHIATRIC: The patient is alert and oriented x 3. SKIN: No obvious rash, lesion, or ulcer.    DATA REVIEW:   CBC Recent Labs  Lab 07/11/18 2211  WBC 22.1*  HGB 16.9  HCT 50.1  PLT 251    Chemistries  Recent Labs  Lab 07/13/18 0520  NA 140  K 3.3*  CL 106  CO2 21*  GLUCOSE 33*  BUN 9  CREATININE 0.84  CALCIUM 9.3  MG 1.7     Microbiology Results  Results for orders placed or performed during the hospital encounter of 07/11/18  MRSA PCR Screening     Status: None   Collection Time: 07/11/18  9:56 PM  Result Value Ref Range Status   MRSA by PCR NEGATIVE NEGATIVE Final    Comment:        The GeneXpert MRSA Assay (FDA approved for NASAL specimens only), is one component of a comprehensive MRSA  colonization surveillance program. It is not intended to diagnose MRSA infection nor to guide or monitor treatment for MRSA infections. Performed at [facility], [address], [place], [place] [address]     RADIOLOGY:  No results found.   Management plans discussed with the patient, family and they are in agreement.  CODE STATUS:  Code Status History    Date Active Date Inactive Code Status Order ID Comments User Context   07/11/2018 2124 07/13/2018 1826 Full Code [id number]  Sela Hua, MD Inpatient   03/17/2017 2043 03/21/2017 1625 Full Code [id number]  Gladstone Lighter, MD ED   09/01/2016 2047 09/03/2016 1716 Full Code [id number]  Gladstone Lighter, MD Inpatient   10/02/2015 0811 10/03/2015 1605 Full Code [id number]  Harrie Foreman, MD Inpatient      TOTAL TIME TAKING CARE OF THIS PATIENT: 38 minutes.    Gladstone Lighter M.D on 07/14/2018 at 3:34 PM  Between 7am to 6pm - Pager - [phone]  After 6pm go to [email or web address] - [place]  [facility]  Office  [phone]  CC: Primary care physician; [facility], [place]   Note: This dictation was prepared with Diplomatic Services operational officer dictation along with smaller Company secretary. Any transcriptional errors that result from this process are unintentional.

## 2018-10-16 ENCOUNTER — Encounter: Payer: Self-pay | Admitting: Emergency Medicine

## 2018-10-16 ENCOUNTER — Emergency Department
Admission: EM | Admit: 2018-10-16 | Discharge: 2018-10-17 | Disposition: A | Discharge status: Home / Self Care | Payer: Medicaid Other | Attending: Emergency Medicine | Admitting: Emergency Medicine

## 2018-10-16 DIAGNOSIS — F17210 Nicotine dependence, cigarettes, uncomplicated: Secondary | ICD-10-CM | POA: Insufficient documentation

## 2018-10-16 DIAGNOSIS — E1010 Type 1 diabetes mellitus with ketoacidosis without coma: Secondary | ICD-10-CM | POA: Insufficient documentation

## 2018-10-16 LAB — LIPASE, BLOOD: Lipase: 19 U/L (ref 11–51)

## 2018-10-16 LAB — CBC WITH DIFFERENTIAL/PLATELET
Abs Immature Granulocytes: 0.05 10*3/uL (ref 0.00–0.07)
BASOS ABS: 0.1 10*3/uL (ref 0.0–0.1)
Basophils Relative: 1 %
EOS PCT: 0 %
Eosinophils Absolute: 0 10*3/uL (ref 0.0–0.5)
HCT: 50.6 % (ref 39.0–52.0)
Hemoglobin: 17.1 g/dL — ABNORMAL HIGH (ref 13.0–17.0)
Immature Granulocytes: 1 %
LYMPHS ABS: 1.8 10*3/uL (ref 0.7–4.0)
Lymphocytes Relative: 20 %
MCH: 29.3 pg (ref 26.0–34.0)
MCHC: 33.8 g/dL (ref 30.0–36.0)
MCV: 86.8 fL (ref 80.0–100.0)
MONO ABS: 0.5 10*3/uL (ref 0.1–1.0)
MONOS PCT: 6 %
NEUTROS PCT: 72 %
NRBC: 0 % (ref 0.0–0.2)
Neutro Abs: 6.7 10*3/uL (ref 1.7–7.7)
PLATELETS: 313 10*3/uL (ref 150–400)
RBC: 5.83 MIL/uL — ABNORMAL HIGH (ref 4.22–5.81)
RDW: 13.5 % (ref 11.5–15.5)
WBC: 9.2 10*3/uL (ref 4.0–10.5)

## 2018-10-16 LAB — BLOOD GAS, VENOUS
Acid-base deficit: 4.1 mmol/L — ABNORMAL HIGH (ref 0.0–2.0)
Bicarbonate: 21.6 mmol/L (ref 20.0–28.0)
O2 Saturation: 90.8 %
PH VEN: 7.33 (ref 7.250–7.430)
PO2 VEN: 65 mmHg — AB (ref 32.0–45.0)
Patient temperature: 37
pCO2, Ven: 41 mmHg — ABNORMAL LOW (ref 44.0–60.0)

## 2018-10-16 LAB — BETA-HYDROXYBUTYRIC ACID
BETA-HYDROXYBUTYRIC ACID: 4.39 mmol/L — AB (ref 0.05–0.27)
BETA-HYDROXYBUTYRIC ACID: 4.13 mmol/L — AB (ref 0.05–0.27)

## 2018-10-16 LAB — COMPREHENSIVE METABOLIC PANEL
ALBUMIN: 5.3 g/dL — AB (ref 3.5–5.0)
ALK PHOS: 129 U/L — AB (ref 38–126)
ALT: 66 U/L — AB (ref 0–44)
AST: 81 U/L — AB (ref 15–41)
Anion gap: 17 — ABNORMAL HIGH (ref 5–15)
BILIRUBIN TOTAL: 1 mg/dL (ref 0.3–1.2)
BUN: 23 mg/dL — ABNORMAL HIGH (ref 6–20)
CALCIUM: 10.3 mg/dL (ref 8.9–10.3)
CO2: 19 mmol/L — AB (ref 22–32)
CREATININE: 1.41 mg/dL — AB (ref 0.61–1.24)
Chloride: 104 mmol/L (ref 98–111)
GFR calc Af Amer: >60 mL/min (ref >60)
GFR calc non Af Amer: >60 mL/min (ref >60)
Glucose, Bld: 181 mg/dL — ABNORMAL HIGH (ref 70–99)
POTASSIUM: 3.8 mmol/L (ref 3.5–5.1)
Sodium: 140 mmol/L (ref 135–145)
TOTAL PROTEIN: 8.5 g/dL — AB (ref 6.5–8.1)

## 2018-10-16 LAB — BASIC METABOLIC PANEL
ANION GAP: 13 (ref 5–15)
BUN: 22 mg/dL — ABNORMAL HIGH (ref 6–20)
CALCIUM: 8.9 mg/dL (ref 8.9–10.3)
CHLORIDE: 107 mmol/L (ref 98–111)
CO2: 20 mmol/L — AB (ref 22–32)
Creatinine, Ser: 1.38 mg/dL — ABNORMAL HIGH (ref 0.61–1.24)
Glucose, Bld: 168 mg/dL — ABNORMAL HIGH (ref 70–99)
Potassium: 4.2 mmol/L (ref 3.5–5.1)
SODIUM: 140 mmol/L (ref 135–145)

## 2018-10-16 LAB — TROPONIN I: Troponin I: <0.03 ng/mL (ref <0.03)

## 2018-10-16 MED ORDER — SODIUM CHLORIDE 0.9 % IV SOLN
Freq: Once | INTRAVENOUS | Status: AC
  Administered 2018-10-16: 22:00:00 via INTRAVENOUS

## 2018-10-16 MED ORDER — ONDANSETRON HCL 4 MG/2ML IJ SOLN
4.0000 mg | Freq: Once | INTRAMUSCULAR | Status: AC
  Administered 2018-10-16: 4 mg via INTRAVENOUS
  Filled 2018-10-16: qty 2

## 2018-10-16 NOTE — ED Notes (Addendum)
Blood drawn for recheck per MD and sent to lab; pt resting quietly in bed with eyes closed; resp even and unlabored; visitor at bedside; IV site to right arm unremarkable; fluids infusing without difficulty

## 2018-10-16 NOTE — ED Notes (Signed)
Report given to Laurie Allen RN 

## 2018-10-16 NOTE — ED Triage Notes (Signed)
Patient states that he has a history of DM 1. Patient states that he last checked his blood sugar yesterday and it was over 300. Patient states that he started vomiting this morning but has been taking his insulin. Patient states that he has a history of DKA and that this feels the same.

## 2018-10-16 NOTE — ED Provider Notes (Signed)
[facility] Emergency Department Provider Note       Time seen: ----------------------------------------- 9:05 PM on 10/16/2018 -----------------------------------------   I have reviewed the triage vital signs and the nursing notes.  HISTORY   Chief Complaint Emesis    HPI Wesley [name] is a 23 y.o. male with a history of diabetes who presents to the ED for possible DKA.  Patient states he has history of type 1 diabetes, last checked his blood sugar yesterday and it was over 300.  Patient states he started vomiting this morning but has been taking his insulin.  He did go through a 24-hour period where he ran out of his insulin.  Patient reports a history of DKA that felt similarly.  Past Medical History:  Diagnosis Date  . Diabetes mellitus without complication (HCC)    type 1 diabetes    Patient Active Problem List   Diagnosis Date Noted  . DKA (diabetic ketoacidoses) (HCC) 09/01/2016  . Sepsis (HCC) 10/02/2015  . Scrotal abscess   . Diabetes mellitus with ketoacidosis (HCC) 08/13/2012  . Type 1 diabetes mellitus (HCC) 02/06/2012    Past Surgical History:  Procedure Laterality Date  . HAND SURGERY    . INCISION AND DRAINAGE ABSCESS N/A 10/02/2015   Procedure: INCISION AND DRAINAGE ABSCESS;  Surgeon: Jerilee Field, MD;  Location: [facility] ORS;  Service: Urology;  Laterality: N/A;  . TONSILLECTOMY    . TONSILLECTOMY AND ADENOIDECTOMY    . UMBILICAL HERNIA REPAIR      Allergies Patient has no known allergies.  Social History Social History   Tobacco Use  . Smoking status: Current Some Day Smoker    Packs/day: 0.50    Types: Cigarettes  . Smokeless tobacco: Never Used  Substance Use Topics  . Alcohol use: Yes    Comment: occasional  . Drug use: No   Review of Systems Constitutional: Negative for fever. Cardiovascular: Negative for chest pain. Respiratory: Negative for shortness of breath. Gastrointestinal: Positive for abdominal  pain, vomiting Musculoskeletal: Negative for back pain. Skin: Negative for rash. Neurological: Negative for headaches, focal weakness or numbness.  All systems negative/normal/unremarkable except as stated in the HPI  ____________________________________________   PHYSICAL EXAM:  VITAL SIGNS: ED Triage Vitals  Enc Vitals Group     BP 10/16/18 2052 (!) 161/119     Pulse Rate 10/16/18 2052 (!) 130     Resp 10/16/18 2052 (!) 22     Temp --      Temp src --      SpO2 10/16/18 2052 99 %     Weight 10/16/18 2053 175 lb (79.4 kg)     Height 10/16/18 2053 5\' 9"  (1.753 m)     Head Circumference --      Peak Flow --      Pain Score 10/16/18 2052 8     Pain Loc --      Pain Edu? --      Excl. in GC? --    Constitutional: Alert and oriented.  Mild distress Eyes: Conjunctivae are normal. Normal extraocular movements. Cardiovascular: Rapid rate, regular rhythm. No murmurs, rubs, or gallops. Respiratory: Normal respiratory effort without tachypnea nor retractions. Breath sounds are clear and equal bilaterally. No wheezes/rales/rhonchi. Gastrointestinal: Soft and nontender. Normal bowel sounds Musculoskeletal: Nontender with normal range of motion in extremities. No lower extremity tenderness nor edema. Neurologic:  Normal speech and language. No gross focal neurologic deficits are appreciated.  Skin:  Skin is warm, dry and intact. No rash  noted. Psychiatric: Mood and affect are normal. Speech and behavior are normal.  ____________________________________________  ED COURSE:  As part of my medical decision making, I reviewed the following data within the electronic MEDICAL RECORD NUMBER History obtained from family if available, nursing notes, old chart and ekg, as well as notes from prior ED visits. Patient presented for possible DKA, we will assess with labs and imaging as indicated at this time.   Procedures ____________________________________________   LABS (pertinent  positives/negatives)  Labs Reviewed  CBC WITH DIFFERENTIAL/PLATELET - Abnormal; Notable for the following components:      Result Value   RBC 5.83 (*)    Hemoglobin 17.1 (*)    All other components within normal limits  COMPREHENSIVE METABOLIC PANEL - Abnormal; Notable for the following components:   CO2 19 (*)    Glucose, Bld 181 (*)    BUN 23 (*)    Creatinine, Ser 1.41 (*)    Total Protein 8.5 (*)    Albumin 5.3 (*)    AST 81 (*)    ALT 66 (*)    Alkaline Phosphatase 129 (*)    Anion gap 17 (*)    All other components within normal limits  BETA-HYDROXYBUTYRIC ACID - Abnormal; Notable for the following components:   Beta-Hydroxybutyric Acid 4.39 (*)    All other components within normal limits  BLOOD GAS, VENOUS - Abnormal; Notable for the following components:   pCO2, Ven 41 (*)    pO2, Ven 65.0 (*)    Acid-base deficit 4.1 (*)    All other components within normal limits  LIPASE, BLOOD  TROPONIN I  URINALYSIS, COMPLETE (UACMP) WITH MICROSCOPIC  BETA-HYDROXYBUTYRIC ACID  BASIC METABOLIC PANEL   ____________________________________________   DIFFERENTIAL DIAGNOSIS   Dehydration, electrolyte abnormality, DKA, HH NK  FINAL ASSESSMENT AND PLAN  Diabetic ketoacidosis   Plan: The patient had presented for hyperglycemia and vomiting. Patient's labs revealed mild DKA.  He was given 2 L of IV fluid and has restarted his insulin.  Overall he is asymptomatic at this point.  I think he was likely in DKA but then started taking his insulin again which has resulted in improvement in his blood work and acidosis.  I will repeat his beta hydroxybutyric acid and BMP.  If these appear improved I think it would be safe for him to go home with continued use of his insulin.   Ulice DashJohnathan E , MD    Note: This note was generated in part or whole with voice recognition software. Voice recognition is usually quite accurate but there are transcription errors that can and very often  do occur. I apologize for any typographical errors that were not detected and corrected.     Emily Filbert,  E, MD 10/16/18 2255

## 2018-10-16 NOTE — ED Notes (Signed)
Patient states he is having difficulty obtaining his insulin and ran out yesterday. Patient's breath smells like acetone.

## 2018-10-16 NOTE — Discharge Instructions (Addendum)
1.  Take your medicines daily as directed by your doctor. 2.  Return to the ER for worsening symptoms, persistent vomiting, difficulty breathing or other concerns.

## 2018-10-17 ENCOUNTER — Other Ambulatory Visit: Payer: Self-pay

## 2018-10-17 ENCOUNTER — Encounter: Payer: Self-pay | Admitting: Internal Medicine

## 2018-10-17 ENCOUNTER — Inpatient Hospital Stay
Admission: EM | Admit: 2018-10-17 | Discharge: 2018-10-20 | DRG: 638 | Disposition: A | Discharge status: Home / Self Care | Payer: Medicaid Other | Attending: Internal Medicine | Admitting: Internal Medicine

## 2018-10-17 DIAGNOSIS — E1110 Type 2 diabetes mellitus with ketoacidosis without coma: Secondary | ICD-10-CM | POA: Diagnosis present

## 2018-10-17 DIAGNOSIS — Z794 Long term (current) use of insulin: Secondary | ICD-10-CM

## 2018-10-17 DIAGNOSIS — N183 Chronic kidney disease, stage 3 (moderate): Secondary | ICD-10-CM | POA: Diagnosis present

## 2018-10-17 DIAGNOSIS — R112 Nausea with vomiting, unspecified: Secondary | ICD-10-CM | POA: Diagnosis present

## 2018-10-17 DIAGNOSIS — Z9089 Acquired absence of other organs: Secondary | ICD-10-CM

## 2018-10-17 DIAGNOSIS — R064 Hyperventilation: Secondary | ICD-10-CM | POA: Diagnosis present

## 2018-10-17 DIAGNOSIS — Z833 Family history of diabetes mellitus: Secondary | ICD-10-CM

## 2018-10-17 DIAGNOSIS — E1010 Type 1 diabetes mellitus with ketoacidosis without coma: Principal | ICD-10-CM | POA: Diagnosis present

## 2018-10-17 DIAGNOSIS — E1022 Type 1 diabetes mellitus with diabetic chronic kidney disease: Secondary | ICD-10-CM | POA: Diagnosis present

## 2018-10-17 DIAGNOSIS — Z8249 Family history of ischemic heart disease and other diseases of the circulatory system: Secondary | ICD-10-CM

## 2018-10-17 DIAGNOSIS — N179 Acute kidney failure, unspecified: Secondary | ICD-10-CM

## 2018-10-17 DIAGNOSIS — T383X6A Underdosing of insulin and oral hypoglycemic [antidiabetic] drugs, initial encounter: Secondary | ICD-10-CM | POA: Diagnosis present

## 2018-10-17 DIAGNOSIS — E876 Hypokalemia: Secondary | ICD-10-CM | POA: Diagnosis present

## 2018-10-17 DIAGNOSIS — F17210 Nicotine dependence, cigarettes, uncomplicated: Secondary | ICD-10-CM | POA: Diagnosis present

## 2018-10-17 LAB — CBC WITH DIFFERENTIAL/PLATELET
Abs Immature Granulocytes: 0.04 10*3/uL (ref 0.00–0.07)
Basophils Absolute: 0 10*3/uL (ref 0.0–0.1)
Basophils Relative: 0 %
Eosinophils Absolute: 0 10*3/uL (ref 0.0–0.5)
Eosinophils Relative: 0 %
HCT: 47.7 % (ref 39.0–52.0)
Hemoglobin: 15.8 g/dL (ref 13.0–17.0)
IMMATURE GRANULOCYTES: 0 %
Lymphocytes Relative: 11 %
Lymphs Abs: 1.3 10*3/uL (ref 0.7–4.0)
MCH: 29.5 pg (ref 26.0–34.0)
MCHC: 33.1 g/dL (ref 30.0–36.0)
MCV: 89 fL (ref 80.0–100.0)
MONOS PCT: 5 %
Monocytes Absolute: 0.5 10*3/uL (ref 0.1–1.0)
NEUTROS PCT: 84 %
Neutro Abs: 9.7 10*3/uL — ABNORMAL HIGH (ref 1.7–7.7)
Platelets: 273 10*3/uL (ref 150–400)
RBC: 5.36 MIL/uL (ref 4.22–5.81)
RDW: 13.9 % (ref 11.5–15.5)
WBC: 11.5 10*3/uL — ABNORMAL HIGH (ref 4.0–10.5)
nRBC: 0 % (ref 0.0–0.2)

## 2018-10-17 LAB — GLUCOSE, CAPILLARY
Glucose-Capillary: 485 mg/dL — ABNORMAL HIGH (ref 70–99)
Glucose-Capillary: 388 mg/dL — ABNORMAL HIGH (ref 70–99)
Glucose-Capillary: 228 mg/dL — ABNORMAL HIGH (ref 70–99)
Glucose-Capillary: 301 mg/dL — ABNORMAL HIGH (ref 70–99)
Glucose-Capillary: 394 mg/dL — ABNORMAL HIGH (ref 70–99)
Glucose-Capillary: 350 mg/dL — ABNORMAL HIGH (ref 70–99)
Glucose-Capillary: 242 mg/dL — ABNORMAL HIGH (ref 70–99)
Glucose-Capillary: 204 mg/dL — ABNORMAL HIGH (ref 70–99)

## 2018-10-17 LAB — BLOOD GAS, VENOUS
ACID-BASE DEFICIT: 11.9 mmol/L — AB (ref 0.0–2.0)
Bicarbonate: 12.8 mmol/L — ABNORMAL LOW (ref 20.0–28.0)
O2 Saturation: 72.7 %
PH VEN: 7.3 (ref 7.250–7.430)
Patient temperature: 37
pCO2, Ven: 26 mmHg — ABNORMAL LOW (ref 44.0–60.0)
pO2, Ven: 43 mmHg (ref 32.0–45.0)

## 2018-10-17 LAB — BASIC METABOLIC PANEL
Anion gap: 20 — ABNORMAL HIGH (ref 5–15)
BUN: 14 mg/dL (ref 6–20)
CO2: 8 mmol/L — ABNORMAL LOW (ref 22–32)
CREATININE: 1.08 mg/dL (ref 0.61–1.24)
Calcium: 9 mg/dL (ref 8.9–10.3)
Chloride: 113 mmol/L — ABNORMAL HIGH (ref 98–111)
GFR calc Af Amer: >60 mL/min (ref >60)
GFR calc non Af Amer: >60 mL/min (ref >60)
Glucose, Bld: 451 mg/dL — ABNORMAL HIGH (ref 70–99)
Potassium: 5.2 mmol/L — ABNORMAL HIGH (ref 3.5–5.1)
Sodium: 141 mmol/L (ref 135–145)

## 2018-10-17 LAB — COMPREHENSIVE METABOLIC PANEL
ALT: 66 U/L — ABNORMAL HIGH (ref 0–44)
AST: 58 U/L — AB (ref 15–41)
Albumin: 4.7 g/dL (ref 3.5–5.0)
Alkaline Phosphatase: 120 U/L (ref 38–126)
Anion gap: 25 — ABNORMAL HIGH (ref 5–15)
BUN: 17 mg/dL (ref 6–20)
CO2: 13 mmol/L — ABNORMAL LOW (ref 22–32)
Calcium: 9.6 mg/dL (ref 8.9–10.3)
Chloride: 101 mmol/L (ref 98–111)
Creatinine, Ser: 1.37 mg/dL — ABNORMAL HIGH (ref 0.61–1.24)
GFR calc Af Amer: >60 mL/min (ref >60)
GFR calc non Af Amer: >60 mL/min (ref >60)
Glucose, Bld: 542 mg/dL (ref 70–99)
Potassium: 5 mmol/L (ref 3.5–5.1)
Sodium: 139 mmol/L (ref 135–145)
Total Bilirubin: 1.7 mg/dL — ABNORMAL HIGH (ref 0.3–1.2)
Total Protein: 7.6 g/dL (ref 6.5–8.1)

## 2018-10-17 LAB — HEMOGLOBIN A1C
Hgb A1c MFr Bld: 12.3 % — ABNORMAL HIGH (ref 4.8–5.6)
Mean Plasma Glucose: 306.31 mg/dL

## 2018-10-17 LAB — MRSA PCR SCREENING: MRSA BY PCR: NEGATIVE

## 2018-10-17 MED ORDER — POLYETHYLENE GLYCOL 3350 17 G PO PACK: 17.0000 g | PACK | Freq: Every day | ORAL | Status: DC | PRN

## 2018-10-17 MED ORDER — ACETAMINOPHEN 650 MG RE SUPP: 650.0000 mg | Freq: Four times a day (QID) | RECTAL | Status: DC | PRN

## 2018-10-17 MED ORDER — PROMETHAZINE HCL 25 MG RE SUPP
25.0000 mg | Freq: Once | RECTAL | Status: AC
  Administered 2018-10-17: 25 mg via RECTAL
  Filled 2018-10-17: qty 1

## 2018-10-17 MED ORDER — ONDANSETRON HCL 4 MG/2ML IJ SOLN
4.0000 mg | Freq: Four times a day (QID) | INTRAMUSCULAR | Status: DC | PRN
  Administered 2018-10-17 – 2018-10-18 (×3): 4 mg via INTRAVENOUS
  Filled 2018-10-17 (×3): qty 2

## 2018-10-17 MED ORDER — INSULIN REGULAR(HUMAN) IN NACL 100-0.9 UT/100ML-% IV SOLN
INTRAVENOUS | Status: DC
  Administered 2018-10-17: 3.3 [IU]/h via INTRAVENOUS
  Administered 2018-10-18: 2.6 [IU]/h via INTRAVENOUS
  Filled 2018-10-17 (×2): qty 100

## 2018-10-17 MED ORDER — ENOXAPARIN SODIUM 40 MG/0.4ML ~~LOC~~ SOLN
40.0000 mg | SUBCUTANEOUS | Status: DC
  Administered 2018-10-18 – 2018-10-19 (×2): 40 mg via SUBCUTANEOUS
  Filled 2018-10-17 (×2): qty 0.4

## 2018-10-17 MED ORDER — ONDANSETRON 4 MG PO TBDP
4.0000 mg | ORAL_TABLET | Freq: Once | ORAL | Status: AC
  Administered 2018-10-17: 4 mg via ORAL
  Filled 2018-10-17: qty 1

## 2018-10-17 MED ORDER — SODIUM CHLORIDE 0.9 % IV SOLN: INTRAVENOUS | Status: AC

## 2018-10-17 MED ORDER — INSULIN ASPART 100 UNIT/ML ~~LOC~~ SOLN
10.0000 [IU] | Freq: Once | SUBCUTANEOUS | Status: AC
  Administered 2018-10-17: 10 [IU] via INTRAVENOUS
  Filled 2018-10-17: qty 1

## 2018-10-17 MED ORDER — ACETAMINOPHEN 325 MG PO TABS: 650.0000 mg | ORAL_TABLET | Freq: Four times a day (QID) | ORAL | Status: DC | PRN

## 2018-10-17 MED ORDER — DEXTROSE-NACL 5-0.45 % IV SOLN: INTRAVENOUS | Status: DC

## 2018-10-17 MED ORDER — DEXTROSE 50 % IV SOLN: 25.0000 mL | INTRAVENOUS | Status: DC | PRN

## 2018-10-17 MED ORDER — INSULIN ASPART 100 UNIT/ML ~~LOC~~ SOLN
5.0000 [IU] | Freq: Once | SUBCUTANEOUS | Status: AC
  Administered 2018-10-17: 5 [IU] via INTRAVENOUS
  Filled 2018-10-17: qty 1

## 2018-10-17 MED ORDER — SODIUM CHLORIDE 0.9 % IV SOLN
INTRAVENOUS | Status: DC
  Administered 2018-10-17: 20:00:00 via INTRAVENOUS

## 2018-10-17 MED ORDER — TRAMADOL HCL 50 MG PO TABS: 50.0000 mg | ORAL_TABLET | Freq: Four times a day (QID) | ORAL | Status: DC | PRN

## 2018-10-17 MED ORDER — DEXTROSE-NACL 5-0.45 % IV SOLN
INTRAVENOUS | Status: DC
  Administered 2018-10-17: 22:00:00 via INTRAVENOUS
  Administered 2018-10-18: 125 mL/h via INTRAVENOUS
  Administered 2018-10-18 (×2): via INTRAVENOUS

## 2018-10-17 MED ORDER — SODIUM CHLORIDE 0.9 % IV SOLN: INTRAVENOUS | Status: DC

## 2018-10-17 MED ORDER — INSULIN REGULAR BOLUS VIA INFUSION
0.0000 [IU] | Freq: Three times a day (TID) | INTRAVENOUS | Status: DC
  Filled 2018-10-17: qty 10

## 2018-10-17 MED ORDER — SODIUM CHLORIDE 0.9 % IV BOLUS
1000.0000 mL | Freq: Once | INTRAVENOUS | Status: AC
  Administered 2018-10-17: 1000 mL via INTRAVENOUS

## 2018-10-17 MED ORDER — METOCLOPRAMIDE HCL 5 MG/ML IJ SOLN
5.0000 mg | Freq: Four times a day (QID) | INTRAMUSCULAR | Status: DC | PRN
  Administered 2018-10-17 – 2018-10-20 (×5): 5 mg via INTRAVENOUS
  Filled 2018-10-17 (×6): qty 2

## 2018-10-17 MED ORDER — ALBUTEROL SULFATE (2.5 MG/3ML) 0.083% IN NEBU: 2.5000 mg | INHALATION_SOLUTION | RESPIRATORY_TRACT | Status: DC | PRN

## 2018-10-17 MED ORDER — PROMETHAZINE HCL 25 MG/ML IJ SOLN
12.5000 mg | Freq: Four times a day (QID) | INTRAMUSCULAR | Status: DC | PRN
  Administered 2018-10-17: 12.5 mg via INTRAVENOUS
  Filled 2018-10-17: qty 1

## 2018-10-17 MED ORDER — ONDANSETRON HCL 4 MG PO TABS
4.0000 mg | ORAL_TABLET | Freq: Four times a day (QID) | ORAL | Status: DC | PRN
Start: 2018-10-17 — End: 2018-10-18

## 2018-10-17 MED ORDER — SODIUM CHLORIDE 0.9 % IV BOLUS
1500.0000 mL | Freq: Once | INTRAVENOUS | Status: AC
  Administered 2018-10-17: 1500 mL via INTRAVENOUS

## 2018-10-17 NOTE — ED Provider Notes (Signed)
[facility] Emergency Department Provider Note    First MD Initiated Contact with Patient 10/17/18 1453     (approximate)  I have reviewed the triage vital signs and the nursing notes.   HISTORY  Chief Complaint Emesis and Hyperventilating    HPI Wesley [name] is a 23 y.o. male with a history of insulin-dependent diabetes presents the ER with shortness of breath feels like he is hyperventilating nausea and malaise.  Has not been able to keep any food down due to nausea and vomiting.  States "I think I am in DKA ".  Patient was seen last night and was given IV fluids but went home and was not able to take any insulin.    Past Medical History:  Diagnosis Date  . Diabetes mellitus without complication ([place])    type 1 diabetes   Family History  Problem Relation Age of Onset  . Diabetes Mellitus II Paternal Grandfather   . Hypertension Mother    Past Surgical History:  Procedure Laterality Date  . HAND SURGERY    . INCISION AND DRAINAGE ABSCESS N/A 10/02/2015   Procedure: INCISION AND DRAINAGE ABSCESS;  Surgeon: Festus Aloe, MD;  Location: [facility] ORS;  Service: Urology;  Laterality: N/A;  . TONSILLECTOMY    . TONSILLECTOMY AND ADENOIDECTOMY    . UMBILICAL HERNIA REPAIR     Patient Active Problem List   Diagnosis Date Noted  . DKA (diabetic ketoacidoses) ([place]) 09/01/2016  . Sepsis ([place]) 10/02/2015  . Scrotal abscess   . Diabetes mellitus with ketoacidosis ([place]) 08/13/2012  . Type 1 diabetes mellitus ([place]) 02/06/2012      Prior to Admission medications   Medication Sig Start Date End Date Taking? Authorizing Provider  blood glucose meter kit and supplies KIT Dispense based on patient and insurance preference. Use up to four times daily as directed. (FOR ICD-9 250.00, 250.01). 03/21/17   Gladstone Lighter, MD  insulin aspart (NOVOLOG) 100 UNIT/ML injection If finger sticks <150- no novolog coverage, if between 151-200- use 2 units, if  201-250- use 4 units, if 251-300- use 6 units, if 301-351- use 8 units, if > 350- call MD 07/13/18 07/13/19  Gladstone Lighter, MD  insulin detemir (LEVEMIR) 100 UNIT/ML injection Inject 0.12 mLs (12 Units total) into the skin 2 (two) times daily. 07/13/18   Gladstone Lighter, MD    Allergies Patient has no known allergies.    Social History Social History   Tobacco Use  . Smoking status: Current Some Day Smoker    Packs/day: 0.50    Types: Cigarettes  . Smokeless tobacco: Never Used  Substance Use Topics  . Alcohol use: Yes    Comment: occasional  . Drug use: No    Review of Systems Patient denies headaches, rhinorrhea, blurry vision, numbness, shortness of breath, chest pain, edema, cough, abdominal pain, nausea, vomiting, diarrhea, dysuria, fevers, rashes or hallucinations unless otherwise stated above in HPI. ____________________________________________   PHYSICAL EXAM:  VITAL SIGNS: Vitals:   10/17/18 1451  BP: (!) 132/94  Pulse: 89  Resp: (!) 24  Temp: 98.2 F (36.8 C)  SpO2: 99%    Constitutional: Alert and oriented. Ill appearing Eyes: Conjunctivae are normal.  Head: Atraumatic. Nose: No congestion/rhinnorhea. Mouth/Throat: Mucous membranes are moist.   Neck: No stridor. Painless ROM.  Cardiovascular: Normal rate, regular rhythm. Grossly normal heart sounds.  Good peripheral circulation. Respiratory: kussmaul respirations. Gastrointestinal: Soft and nontender. No distention. No abdominal bruits. No CVA tenderness. Genitourinary:  Musculoskeletal: No lower  extremity tenderness nor edema.  No joint effusions. Neurologic:  Normal speech and language. No gross focal neurologic deficits are appreciated. No facial droop Skin:  Skin is warm, dry and intact. No rash noted. Psychiatric: Mood and affect are normal. Speech and behavior are normal.  ____________________________________________   LABS (all labs ordered are listed, but only abnormal results are  displayed)  Results for orders placed or performed during the hospital encounter of 10/16/18 (from the past 24 hour(s))  CBC with Differential     Status: Abnormal   Collection Time: 10/16/18  9:13 PM  Result Value Ref Range   WBC 9.2 4.0 - 10.5 K/uL   RBC 5.83 (H) 4.22 - 5.81 MIL/uL   Hemoglobin 17.1 (H) 13.0 - 17.0 g/dL   HCT 50.6 39.0 - 52.0 %   MCV 86.8 80.0 - 100.0 fL   MCH 29.3 26.0 - 34.0 pg   MCHC 33.8 30.0 - 36.0 g/dL   RDW 13.5 11.5 - 15.5 %   Platelets 313 150 - 400 K/uL   nRBC 0.0 0.0 - 0.2 %   Neutrophils Relative % 72 %   Neutro Abs 6.7 1.7 - 7.7 K/uL   Lymphocytes Relative 20 %   Lymphs Abs 1.8 0.7 - 4.0 K/uL   Monocytes Relative 6 %   Monocytes Absolute 0.5 0.1 - 1.0 K/uL   Eosinophils Relative 0 %   Eosinophils Absolute 0.0 0.0 - 0.5 K/uL   Basophils Relative 1 %   Basophils Absolute 0.1 0.0 - 0.1 K/uL   Immature Granulocytes 1 %   Abs Immature Granulocytes 0.05 0.00 - 0.07 K/uL  Comprehensive metabolic panel     Status: Abnormal   Collection Time: 10/16/18  9:13 PM  Result Value Ref Range   Sodium 140 135 - 145 mmol/L   Potassium 3.8 3.5 - 5.1 mmol/L   Chloride 104 98 - 111 mmol/L   CO2 19 (L) 22 - 32 mmol/L   Glucose, Bld 181 (H) 70 - 99 mg/dL   BUN 23 (H) 6 - 20 mg/dL   Creatinine, Ser 1.41 (H) 0.61 - 1.24 mg/dL   Calcium 10.3 8.9 - 10.3 mg/dL   Total Protein 8.5 (H) 6.5 - 8.1 g/dL   Albumin 5.3 (H) 3.5 - 5.0 g/dL   AST 81 (H) 15 - 41 U/L   ALT 66 (H) 0 - 44 U/L   Alkaline Phosphatase 129 (H) 38 - 126 U/L   Total Bilirubin 1.0 0.3 - 1.2 mg/dL   GFR calc non Af Amer >60 >60 mL/min   GFR calc Af Amer >60 >60 mL/min   Anion gap 17 (H) 5 - 15  Lipase, blood     Status: None   Collection Time: 10/16/18  9:13 PM  Result Value Ref Range   Lipase 19 11 - 51 U/L  Troponin I - Once     Status: None   Collection Time: 10/16/18  9:13 PM  Result Value Ref Range   Troponin I <0.03 <0.03 ng/mL  Beta-hydroxybutyric acid     Status: Abnormal   Collection  Time: 10/16/18  9:13 PM  Result Value Ref Range   Beta-Hydroxybutyric Acid 4.39 (H) 0.05 - 0.27 mmol/L  Blood gas, venous     Status: Abnormal   Collection Time: 10/16/18  9:13 PM  Result Value Ref Range   pH, Ven 7.33 7.250 - 7.430   pCO2, Ven 41 (L) 44.0 - 60.0 mmHg   pO2, Ven 65.0 (H) 32.0 - 45.0 mmHg  Bicarbonate 21.6 20.0 - 28.0 mmol/L   Acid-base deficit 4.1 (H) 0.0 - 2.0 mmol/L   O2 Saturation 90.8 %   Patient temperature 37.0    Collection site LINE    Sample type VENOUS   Beta-hydroxybutyric acid     Status: Abnormal   Collection Time: 10/16/18 10:53 PM  Result Value Ref Range   Beta-Hydroxybutyric Acid 4.13 (H) 0.05 - 0.27 mmol/L  Basic metabolic panel     Status: Abnormal   Collection Time: 10/16/18 10:53 PM  Result Value Ref Range   Sodium 140 135 - 145 mmol/L   Potassium 4.2 3.5 - 5.1 mmol/L   Chloride 107 98 - 111 mmol/L   CO2 20 (L) 22 - 32 mmol/L   Glucose, Bld 168 (H) 70 - 99 mg/dL   BUN 22 (H) 6 - 20 mg/dL   Creatinine, Ser 1.38 (H) 0.61 - 1.24 mg/dL   Calcium 8.9 8.9 - 10.3 mg/dL   GFR calc non Af Amer >60 >60 mL/min   GFR calc Af Amer >60 >60 mL/min   Anion gap 13 5 - 15   ____________________________________________ ____________________________________________  RADIOLOGY   ____________________________________________   PROCEDURES  Procedure(s) performed:  .Critical Care Performed by: Merlyn Lot, MD Authorized by: Merlyn Lot, MD   Critical care provider statement:    Critical care time (minutes):  35   Critical care time was exclusive of:  Separately billable procedures and treating other patients   Critical care was necessary to treat or prevent imminent or life-threatening deterioration of the following conditions:  Metabolic crisis and endocrine crisis   Critical care was time spent personally by me on the following activities:  Development of treatment plan with patient or surrogate, discussions with consultants, evaluation  of patient's response to treatment, examination of patient, obtaining history from patient or surrogate, ordering and performing treatments and interventions, ordering and review of laboratory studies, ordering and review of radiographic studies, pulse oximetry, re-evaluation of patient's condition and review of old charts      Critical Care performed: yes ____________________________________________   INITIAL IMPRESSION / ASSESSMENT AND PLAN / ED COURSE  Pertinent labs & imaging results that were available during my care of the patient were reviewed by me and considered in my medical decision making (see chart for details).   DDX: DKA, acidosis, dehydration, HH NS  Javon Snee is a 23 y.o. who presents to the ED with symptoms as described above.  Patient is ill-appearing.  Clinically appears to be in DKA.  Abdominal exam soft benign.  Does have significantly elevated glucose.  Will give IV fluids.  The patient will be placed on continuous pulse oximetry and telemetry for monitoring.  Laboratory evaluation will be sent to evaluate for the above complaints.     Clinical Course as of Oct 17 1618  Sun Oct 17, 2018  1608 He does have evidence of high anion gap metabolic acidosis consistent with DKA.  Will be started on insulin drip.  Continuing IV hydration.  Patient stable for admission to the hospital.   [PR]    Clinical Course User Index [PR] Merlyn Lot, MD     As part of my medical decision making, I reviewed the following data within the [place] notes reviewed and incorporated, Labs reviewed, notes from prior ED visits.   ____________________________________________   FINAL CLINICAL IMPRESSION(S) / ED DIAGNOSES  Final diagnoses:  Diabetic ketoacidosis without coma associated with type 1 diabetes mellitus ([place])  NEW MEDICATIONS STARTED DURING THIS VISIT:  New Prescriptions   No medications on file     Note:  This document  was prepared using Dragon voice recognition software and may include unintentional dictation errors.    Merlyn Lot, MD 10/17/18 1620

## 2018-10-17 NOTE — Consult Note (Addendum)
PULMONARY / CRITICAL CARE MEDICINE  Name: Wesley [name] MRN: [id number] DOB: 1996-10-07    LOS: 1  Referring Provider: Dr. Elpidio Anis Reason for Referral: DKA Brief patient description: 23 year old male, known type I diabetic who presents with DKA following medication nonadherence  HPI: This is a 23 year old male with a past medical history as indicated below who presented to the ED with intractable nausea vomiting and hyperglycemia.  He states that he ran out of his insulin and by the time he went to [facility] to get it it was too late he was already very sick.  He is currently complaining of persistent nausea and vomiting.  He has been given multiple doses of antiemetics.  Blood glucose upon arrival in the ED was 542 mg/dL.  His creatinine was also up to 1.37 from his baseline of less than 1.  He is being admitted to the ICU for further management. Of note, patient was seen on 10/16/2017, evaluated treated and discharged home.  At the time his blood glucose was over 300 mg/dL. He reports adhering to all his dietary lifestyle recommendations  Past Medical History:  Diagnosis Date  . Diabetes mellitus without complication (HCC)    type 1 diabetes   Past Surgical History:  Procedure Laterality Date  . HAND SURGERY    . INCISION AND DRAINAGE ABSCESS N/A 10/02/2015   Procedure: INCISION AND DRAINAGE ABSCESS;  Surgeon: Jerilee Field, MD;  Location: [facility] ORS;  Service: Urology;  Laterality: N/A;  . TONSILLECTOMY    . TONSILLECTOMY AND ADENOIDECTOMY    . UMBILICAL HERNIA REPAIR     Prior to Admission medications   Medication Sig Start Date End Date Taking? Authorizing Provider  amLODipine (NORVASC) 5 MG tablet Take 5 mg by mouth daily.   Yes [provider]  clopidogrel (PLAVIX) 75 MG tablet Take 75 mg by mouth daily.   Yes [provider]  donepezil (ARICEPT) 5 MG tablet Take 1 tablet (5 mg total) by mouth at bedtime. 06/07/18 07/17/18 Yes Sowles, Danna Hefty, MD  empagliflozin  (JARDIANCE) 25 MG TABS tablet Take 25 mg by mouth daily.   Yes [provider]  glycopyrrolate (ROBINUL) 1 MG tablet Take 1 mg by mouth 2 (two) times daily.   Yes [provider]  insulin aspart (NOVOLOG FLEXPEN) 100 UNIT/ML FlexPen Inject 12 Units into the skin 2 (two) times daily.   Yes [provider]  insulin aspart (NOVOLOG) 100 UNIT/ML FlexPen Inject 18 Units into the skin daily. At 1700   Yes [provider]  Insulin Degludec-Liraglutide (XULTOPHY) 100-3.6 UNIT-MG/ML SOPN Inject 50 Units into the skin daily.   Yes [provider]  levETIRAcetam (KEPPRA) 500 MG tablet Take 500 mg by mouth 2 (two) times daily.   Yes [provider]  lipase/protease/amylase (CREON) 12000 units CPEP capsule Take 6,000 Units by mouth 3 (three) times daily before meals.   Yes [provider]  lipase/protease/amylase (CREON) 12000 units CPEP capsule Take 3,000 Units by mouth at bedtime. With snack   Yes [provider]  lisinopril (PRINIVIL,ZESTRIL) 5 MG tablet Take 5 mg by mouth daily.   Yes [provider]  metoprolol succinate (TOPROL-XL) 25 MG 24 hr tablet Take 1 tablet (25 mg total) by mouth daily. 06/07/18  Yes Sowles, Danna Hefty, MD  rosuvastatin (CRESTOR) 40 MG tablet Take 1 tablet (40 mg total) by mouth daily. 06/07/18 07/17/18 Yes Alba Cory, MD  aspirin EC 81 MG tablet Take 81 mg by mouth daily.    [provider]  famotidine (PEPCID) 20 MG tablet Take 1 tablet (20 mg total) by mouth 2 (two) times daily. 06/07/18 07/07/18  Alba CorySowles, Krichna, MD  gabapentin (NEURONTIN) 300 MG capsule Take 1 capsule (300 mg total) by mouth 2 (two) times daily. 06/07/18 07/07/18  Alba CorySowles, Krichna, MD  insulin glargine (LANTUS) 100 UNIT/ML injection Inject 0.1 mLs (10 Units total) into the skin daily. 06/07/18 07/07/18  Alba CorySowles, Krichna, MD  lacosamide 100 MG TABS Take 1 tablet (100 mg total) by mouth 2 (two) times daily. Patient not taking:  Reported on 07/17/2018 10/23/17   Enedina FinnerPatel, Sona, MD  promethazine (PHENERGAN) 12.5 MG tablet Take 1 tablet (12.5 mg total) by mouth every 6 (six) hours as needed for nausea or vomiting. Patient not taking: Reported on 07/17/2018 08/11/17   Almond LintByerly, Faera, MD  sertraline (ZOLOFT) 25 MG tablet Take 1 tablet (25 mg total) by mouth daily. Patient not taking: Reported on 07/17/2018 06/07/18   Alba CorySowles, Krichna, MD   Allergies No Known Allergies  Family History Family History  Problem Relation Age of Onset  . Diabetes Mellitus II Paternal Grandfather   . Hypertension Mother    Social History  reports that he has been smoking cigarettes. He has been smoking about 0.50 packs per day. He has never used smokeless tobacco. He reports current alcohol use. He reports that he does not use drugs.  Review Of Systems:   Constitutional: Negative for fever and chills but positive for generalized malaise.  HENT: Negative for congestion and rhinorrhea.  Eyes: Negative for redness and visual disturbance.  Respiratory: Negative for shortness of breath and wheezing.  Cardiovascular: Negative for chest pain and palpitations.  Gastrointestinal: Positive for nausea , vomiting but negative for abdominal pain and  Loose stools Genitourinary: Negative for dysuria and urgency.  Endocrine: Denies polyuria, polyphagia and heat intolerance Musculoskeletal: Negative for myalgias and arthralgias.  Skin: Negative for pallor and wound.  Neurological: Negative for dizziness and headaches   VITAL SIGNS: BP 118/75   Pulse 70   Temp 98.8 F (37.1 C) (Oral)   Resp 16   Ht 5\' 9"  (1.753 m)   Wt 72.7 kg   SpO2 95%   BMI 23.67 kg/m   HEMODYNAMICS:    VENTILATOR SETTINGS:    INTAKE / OUTPUT: I/O last 3 completed shifts: In: 2000 [IV Piggyback:2000] Out: -   PHYSICAL EXAMINATION: General: Appears acutely ill HEENT: PERRLA, trachea midline, no JVD Neuro: Alert and oriented x4, no focal deficits Cardiovascular:  Apical pulse regular, S1-S2, no murmur regurg or gallop, +2 pulses bilaterally, no edema Lungs: Clear to auscultation bilaterally Abdomen: Nondistended, normal bowel sounds in all 4 quadrants, palpation reveals no organomegaly Musculoskeletal: No joint deformities Skin: Warm and dry  LABS:  BMET Recent Labs  Lab 10/17/18 1504 10/17/18 2022 10/18/18 0025  NA 139 141 145  K 5.0 5.2* 4.2  CL 101 113* 119*  CO2 13* 8* 18*  BUN 17 14 11   CREATININE 1.37* 1.08 0.93  GLUCOSE 542* 451* 172*    Electrolytes Recent Labs  Lab 10/17/18 1504 10/17/18 2022 10/18/18 0025  CALCIUM 9.6 9.0 9.1  MG  --   --  2.1  PHOS  --   --  1.9*    CBC Recent Labs  Lab 10/16/18 2113 10/17/18 1504  WBC 9.2 11.5*  HGB 17.1* 15.8  HCT 50.6 47.7  PLT 313 273    Coag's No results for input(s): APTT, INR in the last 168 hours.  Sepsis Markers No results  for input(s): LATICACIDVEN, PROCALCITON, O2SATVEN in the last 168 hours.  ABG No results for input(s): PHART, PCO2ART, PO2ART in the last 168 hours.  Liver Enzymes Recent Labs  Lab 10/16/18 2113 10/17/18 1504  AST 81* 58*  ALT 66* 66*  ALKPHOS 129* 120  BILITOT 1.0 1.7*  ALBUMIN 5.3* 4.7    Cardiac Enzymes Recent Labs  Lab 10/16/18 2113  TROPONINI <0.03    Glucose Recent Labs  Lab 10/17/18 2318 10/18/18 0021 10/18/18 0126 10/18/18 0226 10/18/18 0329 10/18/18 0432  GLUCAP 204* 167* 141* 112* 179* 207*    Imaging No results found.   ASSESSMENT  DKA Acute renal failure Nausea and vomiting  PLAN DKA protocol Trend creatinine Monitor and correct electrolytes Care management consult for medication assistance Diabetes coordinator consult for DKA and nonadherence  Best Practice: Code Status: Full code Diet: N.p.o. tonight and start with clear liquids and progress to carbohydrate Modified diet as tolerated GI prophylaxis: Not indicated VTE prophylaxis: Lovenox 40 mg subcu every 24 hours  FAMILY  - Updates:  No family at bedside.  Patient updated on current treatment plan.  All questions answered.  Aleiyah Halpin S. [facility] ANP-BC Pulmonary and Critical Care Medicine [facility] Pager [phone] or [phone]  NB: This document was prepared using Dragon voice recognition software and may include unintentional dictation errors.    10/18/2018, 5:01 AM

## 2018-10-17 NOTE — ED Notes (Signed)
In to check on pt at visitor's request; call bell in reach but she opted to come out to desk; pt c/o nausea; spoke with Dr Dolores Frame and order given/entered;

## 2018-10-17 NOTE — H&P (Signed)
[facility] [place] NAME: Wesley [name]    MR#:  [id number]  DATE OF BIRTH:  01-08-1996  DATE OF ADMISSION:  10/17/2018  PRIMARY CARE PHYSICIAN: [facility] [place]   REQUESTING/REFERRING PHYSICIAN: Dr. Quentin Cornwall  CHIEF COMPLAINT:   Chief Complaint  Patient presents with  . Emesis  . Hyperventilating    HISTORY OF PRESENT ILLNESS:  Wesley [name]  is a 23 y.o. male with a known history of type 1 diabetes mellitus on Levemir and NovoLog at home presents to the emergency room complaining of intractable vomiting and abdominal pain.  Patient has been found to have blood sugars in the 500s along with anion gap 25 and low bicarb.  DKA present.  Patient is being admitted to the hospital. Patient ran out of his insulin for 1 day but has been taking his Levemir since yesterday.  Due to vomiting and abdominal pain was unable to keep anything down.  PAST MEDICAL HISTORY:   Past Medical History:  Diagnosis Date  . Diabetes mellitus without complication ([place])    type 1 diabetes    PAST SURGICAL HISTORY:   Past Surgical History:  Procedure Laterality Date  . HAND SURGERY    . INCISION AND DRAINAGE ABSCESS N/A 10/02/2015   Procedure: INCISION AND DRAINAGE ABSCESS;  Surgeon: Festus Aloe, MD;  Location: [facility] ORS;  Service: Urology;  Laterality: N/A;  . TONSILLECTOMY    . TONSILLECTOMY AND ADENOIDECTOMY    . UMBILICAL HERNIA REPAIR      SOCIAL HISTORY:   Social History   Tobacco Use  . Smoking status: Current Some Day Smoker    Packs/day: 0.50    Types: Cigarettes  . Smokeless tobacco: Never Used  Substance Use Topics  . Alcohol use: Yes    Comment: occasional    FAMILY HISTORY:   Family History  Problem Relation Age of Onset  . Diabetes Mellitus II Paternal Grandfather   . Hypertension Mother     DRUG ALLERGIES:  No Known Allergies  REVIEW OF SYSTEMS:   Review of Systems  Constitutional: Positive  for malaise/fatigue. Negative for chills and fever.  HENT: Negative for sore throat.   Eyes: Negative for blurred vision, double vision and pain.  Respiratory: Negative for cough, hemoptysis, shortness of breath and wheezing.   Cardiovascular: Negative for chest pain, palpitations, orthopnea and leg swelling.  Gastrointestinal: Positive for abdominal pain, nausea and vomiting. Negative for constipation, diarrhea and heartburn.  Genitourinary: Negative for dysuria and hematuria.  Musculoskeletal: Negative for back pain and joint pain.  Skin: Negative for rash.  Neurological: Negative for sensory change, speech change, focal weakness and headaches.  Endo/Heme/Allergies: Does not bruise/bleed easily.  Psychiatric/Behavioral: Negative for depression. The patient is not nervous/anxious.     MEDICATIONS AT HOME:   Prior to Admission medications   Medication Sig Start Date End Date Taking? Authorizing Provider  blood glucose meter kit and supplies KIT Dispense based on patient and insurance preference. Use up to four times daily as directed. (FOR ICD-9 250.00, 250.01). 03/21/17   Gladstone Lighter, MD  insulin aspart (NOVOLOG) 100 UNIT/ML injection If finger sticks <150- no novolog coverage, if between 151-200- use 2 units, if 201-250- use 4 units, if 251-300- use 6 units, if 301-351- use 8 units, if > 350- call MD 07/13/18 07/13/19  Gladstone Lighter, MD  insulin detemir (LEVEMIR) 100 UNIT/ML injection Inject 0.12 mLs (12 Units total) into the skin 2 (two) times daily. 07/13/18  Gladstone Lighter, MD     VITAL SIGNS:  Blood pressure 128/81, pulse (!) 107, temperature 98.2 F (36.8 C), temperature source Oral, resp. rate 16, height '5\' 9"'  (1.753 m), weight 79.4 kg, SpO2 100 %.  PHYSICAL EXAMINATION:  Physical Exam  GENERAL:  23 y.o.-year-old patient lying in the bed with no acute distress.  EYES: Pupils equal, round, reactive to light and accommodation. No scleral icterus. Extraocular muscles  intact.  HEENT: Head atraumatic, normocephalic. Oropharynx and nasopharynx clear. No oropharyngeal erythema, dry oral mucosa  NECK:  Supple, no jugular venous distention. No thyroid enlargement, no tenderness.  LUNGS: Normal breath sounds bilaterally, no wheezing, rales, rhonchi. No use of accessory muscles of respiration.  CARDIOVASCULAR: S1, S2 normal. No murmurs, rubs, or gallops.  ABDOMEN: Soft, tender , nondistended. Bowel sounds present. No organomegaly or mass.  EXTREMITIES: No pedal edema, cyanosis, or clubbing. + 2 pedal & radial pulses b/l.   NEUROLOGIC: Cranial nerves II through XII are intact. No focal Motor or sensory deficits appreciated b/l PSYCHIATRIC: The patient is alert and oriented x 3. Good affect.  SKIN: No obvious rash, lesion, or ulcer.   LABORATORY PANEL:   CBC Recent Labs  Lab 10/17/18 1504  WBC 11.5*  HGB 15.8  HCT 47.7  PLT 273   ------------------------------------------------------------------------------------------------------------------  Chemistries  Recent Labs  Lab 10/17/18 1504  NA 139  K 5.0  CL 101  CO2 13*  GLUCOSE 542*  BUN 17  CREATININE 1.37*  CALCIUM 9.6  AST 58*  ALT 66*  ALKPHOS 120  BILITOT 1.7*   ------------------------------------------------------------------------------------------------------------------  Cardiac Enzymes Recent Labs  Lab 10/16/18 2113  TROPONINI <0.03   ------------------------------------------------------------------------------------------------------------------  RADIOLOGY:  No results found.   IMPRESSION AND PLAN:   *DKA. Bolus 2 liters normal saline stat at this time.  Start insulin drip. Check BMP every 4 hours.  Accu-Cheks every hour. Admit to stepdown unit. Once DKA resolves he can be resumed on Levemir 20 units twice daily.  Along with sliding scale insulin.   *CKD stage III is stable  DVT prophylaxis with Lovenox  All the records are reviewed and case discussed with ED  provider. Management plans discussed with the patient, family and they are in agreement.  CODE STATUS: FULL CODE  TOTAL CRITICAL CARE  TIME TAKING CARE OF THIS PATIENT: 40 minutes.   Leia Alf Miliani Deike M.D on 10/17/2018 at 4:31 PM  Between 7am to 6pm - Pager - [phone]  After 6pm go to [email or web address] - password [email or web address] [facility]  Office  [phone]  CC: Primary care physician; [facility], [place]  Note: This dictation was prepared with Diplomatic Services operational officer dictation along with smaller phrase technology. Any transcriptional errors that result from this process are unintentional.

## 2018-10-17 NOTE — ED Notes (Signed)
Patient is resting with eyes closed. Patient answers questions appropriately. Unable to call report at this time due to policy.

## 2018-10-17 NOTE — ED Notes (Addendum)
Dr. Roxan Hockey aware of blood glucose of 542.

## 2018-10-17 NOTE — ED Notes (Signed)
Waiting for pharmacy to bring a new vial of Novolog. Dr. Roxan Hockey aware.

## 2018-10-17 NOTE — ED Notes (Signed)
Pharmacy called regarding insulin drip. Pharmacy advised patient has been assigned a bed in ICU and to send the drip there since it was time to call report.

## 2018-10-17 NOTE — ED Notes (Signed)
ICU reassigning patient's bed.

## 2018-10-17 NOTE — ED Triage Notes (Signed)
Pt states that he was seen last night and given fluids, states that he has been vomiting, pt laid himself down in the floor of the lobby, states he is dehydrated and thinks he is in dka, hasn't checked blood sugar today, pt breathing fast in triage

## 2018-10-17 NOTE — ED Notes (Signed)
Patient was brought a cup of water per Dr. Elpidio Anis, but patient refused. Patient is resting at this time.

## 2018-10-17 NOTE — ED Notes (Signed)
Attempting to discharge pt; sat up to get dressed and move to wheelchair and nausea increased with some vomiting; light brown emesis; pt denies drinking any of his girlfriend's cola; said he has just been drinking water; spoke with Dr Dolores Frame and order given for suppository before discharge

## 2018-10-17 NOTE — ED Provider Notes (Signed)
-----------------------------------------   12:22 AM on 10/17/2018 -----------------------------------------  Repeat metabolic panel improved.  Anion gap is now within normal limits.  Patient feels fine and is eager for discharge home.  Strict return precautions given.  Patient verbalizes understanding and agrees with plan of care.   Irean Hong, MD 10/17/18 [phone]

## 2018-10-18 ENCOUNTER — Encounter: Payer: Self-pay | Admitting: *Deleted

## 2018-10-18 ENCOUNTER — Other Ambulatory Visit: Payer: Self-pay

## 2018-10-18 DIAGNOSIS — N179 Acute kidney failure, unspecified: Secondary | ICD-10-CM

## 2018-10-18 LAB — BASIC METABOLIC PANEL
Anion gap: 8 (ref 5–15)
Anion gap: 13 (ref 5–15)
Anion gap: 13 (ref 5–15)
Anion gap: 10 (ref 5–15)
Anion gap: 9 (ref 5–15)
BUN: 11 mg/dL (ref 6–20)
BUN: 10 mg/dL (ref 6–20)
BUN: 9 mg/dL (ref 6–20)
BUN: 7 mg/dL (ref 6–20)
BUN: 6 mg/dL (ref 6–20)
CHLORIDE: 115 mmol/L — AB (ref 98–111)
CO2: 18 mmol/L — ABNORMAL LOW (ref 22–32)
CO2: 16 mmol/L — ABNORMAL LOW (ref 22–32)
CO2: 15 mmol/L — ABNORMAL LOW (ref 22–32)
CO2: 18 mmol/L — ABNORMAL LOW (ref 22–32)
CO2: 19 mmol/L — ABNORMAL LOW (ref 22–32)
Calcium: 9.1 mg/dL (ref 8.9–10.3)
Calcium: 8.9 mg/dL (ref 8.9–10.3)
Calcium: 9 mg/dL (ref 8.9–10.3)
Calcium: 8.9 mg/dL (ref 8.9–10.3)
Calcium: 8.5 mg/dL — ABNORMAL LOW (ref 8.9–10.3)
Chloride: 119 mmol/L — ABNORMAL HIGH (ref 98–111)
Chloride: 114 mmol/L — ABNORMAL HIGH (ref 98–111)
Chloride: 113 mmol/L — ABNORMAL HIGH (ref 98–111)
Chloride: 111 mmol/L (ref 98–111)
Creatinine, Ser: 0.93 mg/dL (ref 0.61–1.24)
Creatinine, Ser: 0.91 mg/dL (ref 0.61–1.24)
Creatinine, Ser: 0.99 mg/dL (ref 0.61–1.24)
Creatinine, Ser: 0.82 mg/dL (ref 0.61–1.24)
Creatinine, Ser: 0.84 mg/dL (ref 0.61–1.24)
GFR calc Af Amer: >60 mL/min (ref >60)
GFR calc Af Amer: >60 mL/min (ref >60)
GFR calc Af Amer: >60 mL/min (ref >60)
GFR calc Af Amer: >60 mL/min (ref >60)
GFR calc non Af Amer: >60 mL/min (ref >60)
GFR calc non Af Amer: >60 mL/min (ref >60)
GFR calc non Af Amer: >60 mL/min (ref >60)
GFR calc non Af Amer: >60 mL/min (ref >60)
GFR calc non Af Amer: >60 mL/min (ref >60)
GLUCOSE: 172 mg/dL — AB (ref 70–99)
GLUCOSE: 193 mg/dL — AB (ref 70–99)
Glucose, Bld: 213 mg/dL — ABNORMAL HIGH (ref 70–99)
Glucose, Bld: 213 mg/dL — ABNORMAL HIGH (ref 70–99)
Glucose, Bld: 138 mg/dL — ABNORMAL HIGH (ref 70–99)
Potassium: 4.2 mmol/L (ref 3.5–5.1)
Potassium: 4.1 mmol/L (ref 3.5–5.1)
Potassium: 3.6 mmol/L (ref 3.5–5.1)
Potassium: 3.7 mmol/L (ref 3.5–5.1)
Potassium: 3.1 mmol/L — ABNORMAL LOW (ref 3.5–5.1)
Sodium: 145 mmol/L (ref 135–145)
Sodium: 144 mmol/L (ref 135–145)
Sodium: 142 mmol/L (ref 135–145)
Sodium: 141 mmol/L (ref 135–145)
Sodium: 139 mmol/L (ref 135–145)

## 2018-10-18 LAB — GLUCOSE, CAPILLARY
GLUCOSE-CAPILLARY: 151 mg/dL — AB (ref 70–99)
GLUCOSE-CAPILLARY: 124 mg/dL — AB (ref 70–99)
GLUCOSE-CAPILLARY: 174 mg/dL — AB (ref 70–99)
Glucose-Capillary: 112 mg/dL — ABNORMAL HIGH (ref 70–99)
Glucose-Capillary: 129 mg/dL — ABNORMAL HIGH (ref 70–99)
Glucose-Capillary: 130 mg/dL — ABNORMAL HIGH (ref 70–99)
Glucose-Capillary: 136 mg/dL — ABNORMAL HIGH (ref 70–99)
Glucose-Capillary: 139 mg/dL — ABNORMAL HIGH (ref 70–99)
Glucose-Capillary: 141 mg/dL — ABNORMAL HIGH (ref 70–99)
Glucose-Capillary: 143 mg/dL — ABNORMAL HIGH (ref 70–99)
Glucose-Capillary: 144 mg/dL — ABNORMAL HIGH (ref 70–99)
Glucose-Capillary: 161 mg/dL — ABNORMAL HIGH (ref 70–99)
Glucose-Capillary: 162 mg/dL — ABNORMAL HIGH (ref 70–99)
Glucose-Capillary: 167 mg/dL — ABNORMAL HIGH (ref 70–99)
Glucose-Capillary: 167 mg/dL — ABNORMAL HIGH (ref 70–99)
Glucose-Capillary: 179 mg/dL — ABNORMAL HIGH (ref 70–99)
Glucose-Capillary: 180 mg/dL — ABNORMAL HIGH (ref 70–99)
Glucose-Capillary: 190 mg/dL — ABNORMAL HIGH (ref 70–99)
Glucose-Capillary: 196 mg/dL — ABNORMAL HIGH (ref 70–99)
Glucose-Capillary: 207 mg/dL — ABNORMAL HIGH (ref 70–99)
Glucose-Capillary: 213 mg/dL — ABNORMAL HIGH (ref 70–99)
Glucose-Capillary: 216 mg/dL — ABNORMAL HIGH (ref 70–99)
Glucose-Capillary: 228 mg/dL — ABNORMAL HIGH (ref 70–99)

## 2018-10-18 LAB — URINALYSIS, COMPLETE (UACMP) WITH MICROSCOPIC
Bacteria, UA: NONE SEEN
Bilirubin Urine: NEGATIVE
Glucose, UA: >=500 mg/dL — AB
Hgb urine dipstick: NEGATIVE
Ketones, ur: 80 mg/dL — AB
Leukocytes, UA: NEGATIVE
Nitrite: NEGATIVE
PH: 5 (ref 5.0–8.0)
Protein, ur: NEGATIVE mg/dL
Specific Gravity, Urine: 1.023 (ref 1.005–1.030)
Squamous Epithelial / HPF: NONE SEEN (ref 0–5)

## 2018-10-18 LAB — PHOSPHORUS: Phosphorus: 1.9 mg/dL — ABNORMAL LOW (ref 2.5–4.6)

## 2018-10-18 LAB — MAGNESIUM: MAGNESIUM: 2.1 mg/dL (ref 1.7–2.4)

## 2018-10-18 MED ORDER — PROMETHAZINE HCL 25 MG/ML IJ SOLN
12.5000 mg | Freq: Once | INTRAMUSCULAR | Status: AC
  Administered 2018-10-18: 12.5 mg via INTRAVENOUS
  Filled 2018-10-18: qty 1

## 2018-10-18 MED ORDER — POTASSIUM CHLORIDE CRYS ER 20 MEQ PO TBCR
30.0000 meq | EXTENDED_RELEASE_TABLET | ORAL | Status: AC
  Administered 2018-10-19: 30 meq via ORAL
  Filled 2018-10-18 (×2): qty 2

## 2018-10-18 MED ORDER — PROMETHAZINE HCL 25 MG/ML IJ SOLN
12.5000 mg | Freq: Four times a day (QID) | INTRAMUSCULAR | Status: DC | PRN
  Administered 2018-10-19 (×3): 12.5 mg via INTRAVENOUS
  Filled 2018-10-18 (×3): qty 1

## 2018-10-18 NOTE — Care Management Note (Signed)
Case Management Note  Patient Details  Name: Wesley [name] MRN: [id number] Date of Birth: 1996-06-09  Subjective/Objective:    Patient is from home; lives with God sister.  He was admitted with DKA; he has type I DM.  He is current with the [facility] and gets his medications/insulin at [place].  He pays $25.00/mo for his Novolin R and 70/30.  Does not have insurance.  He has attempted to obtain medications through [facility] in the past but the cost is too high based on his income.  This RNCM called [facility] and patient was given 1 month supply of insulin in October; was supposed to turn in paperwork by Nov. 11th and never did. Provided application again and encouraged him to fill out and return to [facility] as the cost could be less than [facility] and he could get the preferred insulins Levimir and Novalog.   He states he ran out of his insulin x1 day and became sick.  Patient states he understands the importance of having enough insulin on hand.  He is employed and can afford the $25.00/mo.  He is independent in all adl's.  The diabetes coordinator has consulted.  Will continue to follow through discharge.                   Action/Plan:   Expected Discharge Date:  10/20/18               Expected Discharge Plan:  Home/Self Care  In-House Referral:     Discharge planning Services  CM Consult, Medication Assistance, Indigent Health Clinic  Post Acute Care Choice:    Choice offered to:     DME Arranged:    DME Agency:     HH Arranged:    HH Agency:     Status of Service:  In process, will continue to follow  If discussed at Long Length of Stay Meetings, dates discussed:    Additional Comments:  Sherren Kerns, RN 10/18/2018, 2:53 PM

## 2018-10-18 NOTE — Treatment Plan (Signed)
Please see the full consultation note by Charise Carwin, NP.  Agree with her assessment and plan.  Patient was discussed during multidisciplinary rounds he is close to being transitioned from insulin drip to sliding scale insulin.  He did have some issues with nausea and vomiting that are were not controlled with Reglan or Zofran this morning he received a single dose of Phenergan 12.5 mg which helped.  Continue Reglan for now as the issues may be related to gastroparesis.  He should be able to transfer out of the stepdown unit once he transitions off of insulin drip.

## 2018-10-18 NOTE — Progress Notes (Signed)
Spoke with Dr. Jayme CloudGonzalez and made MD aware that patient continues to have nausea and vomiting with dry heaves and zofran was given at 0759 and reglan given around 0500.  MD gave order for 12.5 mg of phenergan IV once.

## 2018-10-18 NOTE — Progress Notes (Signed)
Inpatient Diabetes Program Recommendations  AACE/ADA: New Consensus Statement on Inpatient Glycemic Control (2015)  Target Ranges:  Prepandial:   less than 140 mg/dL      Peak postprandial:   less than 180 mg/dL (1-2 hours)      Critically ill patients:  140 - 180 mg/dL   Lab Results  Component Value Date   GLUCAP 143 (H) 10/18/2018   HGBA1C 12.3 (H) 10/17/2018    Review of Glycemic Control Results for Wesley [name], Wesley [name] (MRN [id number]) as of 10/18/2018 12:30  Ref. Range 10/18/2018 04:32 10/18/2018 05:19 10/18/2018 06:32 10/18/2018 09:58 10/18/2018 11:07  Glucose-Capillary Latest Ref Range: 70 - 99 mg/dL 413 (H) 244 (H) 010 (H) 136 (H) 143 (H)   Diabetes history: Type 1 DM since age 23 Outpatient Diabetes medications:  Novolin 70/30 10 units bid (patient buys from [place]) Current orders for Inpatient glycemic control:  IV insulin/DKA order set  Inpatient Diabetes Program Recommendations:    Spoke with patient at bedside.  He states that he was most recently prescribed Levemir 10 units bid and Novolog tid with meals, however he no longer qualifies for the "medication clinic" due to having a job.  He is therefore taking Novolin 70/30 from [facility] 10 units bid.  He works from [employer] to 3 Am with his first break being at 7 pm.  This is when he takes his first dose of 70/30 10 units.  He then takes his other dose after getting off work at 3 AM and sleeps during the day.  He admits that he is not always great about remembering to take his insulin and that his A1C is "usually" high.  We discussed complications of DM and why it is important to control.  He is hoping that his current job will eventually be permanent with benefits and health insurance.  Until then, we discussed 70/30 insulin, proper administration and the importance of taking consistently and eating consistently.  This is further complicated by his work schedule.  Patient is still complaining of nausea.  Discussed with RN.  BMP to be done this  afternoon to determine if patient is ready for transition off insulin drip.   Once CO2 normalized and Anion gap closed, consider Novolog 70/30 18 units bid with breakfast and supper and Novolog sensitive correction q 4 hours initially after stopping insulin drip.    Thanks,  Beryl Meager, RN, BC-ADM Inpatient Diabetes Coordinator Pager [phone] (8a-5p)

## 2018-10-18 NOTE — Progress Notes (Signed)
[employer] [facility] - [place] at [facility]   PATIENT NAME: Wesley [name]    MR#:  [id number]  DATE OF BIRTH:  [birth year]  SUBJECTIVE:  CHIEF COMPLAINT:   Chief Complaint  Patient presents with  . Emesis  . Hyperventilating   No new complaint this morning.  No fevers.  No nausea or vomiting.  No abdominal pains.  Being weaned off insulin drip in the stepdown unit.   REVIEW OF SYSTEMS:  Review of Systems  Constitutional: Negative for chills and fever.  HENT: Negative for hearing loss and tinnitus.   Eyes: Negative for blurred vision and double vision.  Respiratory: Negative for cough and hemoptysis.   Cardiovascular: Negative for chest pain and palpitations.  Gastrointestinal: Negative for abdominal pain, heartburn, nausea and vomiting.  Genitourinary: Negative for dysuria and urgency.  Musculoskeletal: Negative for myalgias and neck pain.  Skin: Negative for itching and rash.  Neurological: Negative for dizziness and headaches.  Psychiatric/Behavioral: Negative for depression and suicidal ideas.    DRUG ALLERGIES:  No Known Allergies VITALS:  Blood pressure 121/80, pulse 60, temperature 98.8 F (37.1 C), temperature source Oral, resp. rate 18, height 5\' 9"  (1.753 m), weight 71.8 kg, SpO2 100 %. PHYSICAL EXAMINATION:   Physical Exam  Constitutional: He is oriented to person, place, and time and well-developed, well-nourished, and in no distress. No distress.  HENT:  Head: Normocephalic and atraumatic.  Eyes: Pupils are equal, round, and reactive to light. Conjunctivae are normal. Right eye exhibits no discharge.  Neck: Normal range of motion. Neck supple. No tracheal deviation present.  Cardiovascular: Normal rate and regular rhythm.  Pulmonary/Chest: Effort normal and breath sounds normal. He has no wheezes.  Abdominal: Bowel sounds are normal. He exhibits no distension. There is no abdominal tenderness.  Musculoskeletal: Normal range of motion.      General: No edema.  Lymphadenopathy:    He has no cervical adenopathy.  Neurological: He is alert and oriented to person, place, and time.  Skin: Skin is warm. He is not diaphoretic.  Psychiatric: Affect and judgment normal.   LABORATORY PANEL:  Male CBC Recent Labs  Lab 10/17/18 1504  WBC 11.5*  HGB 15.8  HCT 47.7  PLT 273   ------------------------------------------------------------------------------------------------------------------ Chemistries  Recent Labs  Lab 10/17/18 1504  10/18/18 0025  10/18/18 1447  NA 139   < > 145   < > 141  K 5.0   < > 4.2   < > 3.7  CL 101   < > 119*   < > 113*  CO2 13*   < > 18*   < > 18*  GLUCOSE 542*   < > 172*   < > 138*  BUN 17   < > 11   < > 7  CREATININE 1.37*   < > 0.93   < > 0.82  CALCIUM 9.6   < > 9.1   < > 8.9  MG  --   --  2.1  --   --   AST 58*  --   --   --   --   ALT 66*  --   --   --   --   ALKPHOS 120  --   --   --   --   BILITOT 1.7*  --   --   --   --    < > = values in this interval not displayed.   RADIOLOGY:  No results found. ASSESSMENT AND PLAN:  1. DKA in patient with type 1 diabetes mellitus Patient adequately hydrated with IV fluids.  Was placed on insulin drip and initially admitted to stepdown unit. DKA resolved with anion gap down to normal. Being weaned off insulin drip and transition to subcutaneous insulin by critical care physician. Glycosylated hemoglobin level of 12.3 suggestive of poorly controlled diabetics. Patient seen by diabetic nurse educator.  Patient admitted to running out of insulin due to cost and insurance issues. Will discuss with case manager in a.m. with recommendations regarding assistance with insulin.   DVT prophylaxis with Lovenox  CODE STATUS: FULL CODE  Management plans discussed with the patient,  and he is in agreement.  CODE STATUS: Full Code  TOTAL TIME TAKING CARE OF THIS PATIENT: 36 minutes.   More than 50% of the time was spent in  counseling/coordination of care: YES  POSSIBLE D/C IN  AM, DEPENDING ON CLINICAL CONDITION.   Terianna Peggs M.D on 10/18/2018 at 4:15 PM  Between 7am to 6pm - Pager - [phone]  After 6pm go to [email or web address] - Scientist, research (life sciences)password [email or web address]  [facility]  Office  [phone]  CC: Primary care physician; [facility]  Note: This dictation was prepared with Nurse, children'sDragon dictation along with smaller phrase technology. Any transcriptional errors that result from this process are unintentional.

## 2018-10-19 LAB — BASIC METABOLIC PANEL
Anion gap: 10 (ref 5–15)
BUN: 6 mg/dL (ref 6–20)
CO2: 20 mmol/L — ABNORMAL LOW (ref 22–32)
CREATININE: 0.82 mg/dL (ref 0.61–1.24)
Calcium: 8.5 mg/dL — ABNORMAL LOW (ref 8.9–10.3)
Chloride: 112 mmol/L — ABNORMAL HIGH (ref 98–111)
GFR calc Af Amer: >60 mL/min (ref >60)
GFR calc non Af Amer: >60 mL/min (ref >60)
Glucose, Bld: 149 mg/dL — ABNORMAL HIGH (ref 70–99)
Potassium: 3 mmol/L — ABNORMAL LOW (ref 3.5–5.1)
SODIUM: 142 mmol/L (ref 135–145)

## 2018-10-19 LAB — GLUCOSE, CAPILLARY
GLUCOSE-CAPILLARY: 141 mg/dL — AB (ref 70–99)
Glucose-Capillary: 113 mg/dL — ABNORMAL HIGH (ref 70–99)
Glucose-Capillary: 125 mg/dL — ABNORMAL HIGH (ref 70–99)
Glucose-Capillary: 144 mg/dL — ABNORMAL HIGH (ref 70–99)
Glucose-Capillary: 161 mg/dL — ABNORMAL HIGH (ref 70–99)
Glucose-Capillary: 164 mg/dL — ABNORMAL HIGH (ref 70–99)
Glucose-Capillary: 170 mg/dL — ABNORMAL HIGH (ref 70–99)
Glucose-Capillary: 175 mg/dL — ABNORMAL HIGH (ref 70–99)
Glucose-Capillary: 189 mg/dL — ABNORMAL HIGH (ref 70–99)
Glucose-Capillary: 212 mg/dL — ABNORMAL HIGH (ref 70–99)
Glucose-Capillary: 240 mg/dL — ABNORMAL HIGH (ref 70–99)
Glucose-Capillary: 250 mg/dL — ABNORMAL HIGH (ref 70–99)
Glucose-Capillary: 79 mg/dL (ref 70–99)

## 2018-10-19 LAB — HEPATIC FUNCTION PANEL
ALT: 38 U/L (ref 0–44)
AST: 45 U/L — ABNORMAL HIGH (ref 15–41)
Albumin: 3.2 g/dL — ABNORMAL LOW (ref 3.5–5.0)
Alkaline Phosphatase: 81 U/L (ref 38–126)
Bilirubin, Direct: 0.1 mg/dL (ref 0.0–0.2)
Indirect Bilirubin: 1.1 mg/dL — ABNORMAL HIGH (ref 0.3–0.9)
Total Bilirubin: 1.2 mg/dL (ref 0.3–1.2)
Total Protein: 5.5 g/dL — ABNORMAL LOW (ref 6.5–8.1)

## 2018-10-19 LAB — CBC
HCT: 40.5 % (ref 39.0–52.0)
Hemoglobin: 13.2 g/dL (ref 13.0–17.0)
MCH: 29.3 pg (ref 26.0–34.0)
MCHC: 32.6 g/dL (ref 30.0–36.0)
MCV: 90 fL (ref 80.0–100.0)
Platelets: 189 10*3/uL (ref 150–400)
RBC: 4.5 MIL/uL (ref 4.22–5.81)
RDW: 13.9 % (ref 11.5–15.5)
WBC: 7.3 10*3/uL (ref 4.0–10.5)
nRBC: 0 % (ref 0.0–0.2)

## 2018-10-19 LAB — PHOSPHORUS: Phosphorus: 2.7 mg/dL (ref 2.5–4.6)

## 2018-10-19 LAB — MAGNESIUM: Magnesium: 1.6 mg/dL — ABNORMAL LOW (ref 1.7–2.4)

## 2018-10-19 LAB — LIPASE, BLOOD: Lipase: 21 U/L (ref 11–51)

## 2018-10-19 MED ORDER — POTASSIUM CHLORIDE 10 MEQ/100ML IV SOLN
10.0000 meq | INTRAVENOUS | Status: AC
  Administered 2018-10-19 (×4): 10 meq via INTRAVENOUS
  Filled 2018-10-19 (×4): qty 100

## 2018-10-19 MED ORDER — INSULIN DETEMIR 100 UNIT/ML ~~LOC~~ SOLN
8.0000 [IU] | Freq: Two times a day (BID) | SUBCUTANEOUS | Status: DC
  Administered 2018-10-19: 8 [IU] via SUBCUTANEOUS
  Filled 2018-10-19 (×5): qty 0.08

## 2018-10-19 MED ORDER — INSULIN DETEMIR 100 UNIT/ML ~~LOC~~ SOLN
8.0000 [IU] | Freq: Every day | SUBCUTANEOUS | Status: DC
  Administered 2018-10-19: 8 [IU] via SUBCUTANEOUS
  Filled 2018-10-19: qty 0.08

## 2018-10-19 MED ORDER — POTASSIUM CHLORIDE CRYS ER 20 MEQ PO TBCR: 30.0000 meq | EXTENDED_RELEASE_TABLET | ORAL | Status: DC

## 2018-10-19 MED ORDER — PROMETHAZINE HCL 25 MG/ML IJ SOLN
12.5000 mg | Freq: Once | INTRAMUSCULAR | Status: AC
  Administered 2018-10-19: 12.5 mg via INTRAVENOUS
  Filled 2018-10-19: qty 1

## 2018-10-19 MED ORDER — INSULIN ASPART 100 UNIT/ML ~~LOC~~ SOLN
0.0000 [IU] | Freq: Three times a day (TID) | SUBCUTANEOUS | Status: DC
  Administered 2018-10-19: 2 [IU] via SUBCUTANEOUS
  Filled 2018-10-19: qty 1

## 2018-10-19 MED ORDER — INSULIN ASPART 100 UNIT/ML ~~LOC~~ SOLN
0.0000 [IU] | SUBCUTANEOUS | Status: DC
  Administered 2018-10-19 (×2): 2 [IU] via SUBCUTANEOUS
  Administered 2018-10-19: 1 [IU] via SUBCUTANEOUS
  Administered 2018-10-20: 2 [IU] via SUBCUTANEOUS
  Administered 2018-10-20: 5 [IU] via SUBCUTANEOUS
  Filled 2018-10-19 (×5): qty 1

## 2018-10-19 MED ORDER — MAGNESIUM SULFATE 2 GM/50ML IV SOLN
2.0000 g | Freq: Once | INTRAVENOUS | Status: AC
  Administered 2018-10-19: 2 g via INTRAVENOUS
  Filled 2018-10-19: qty 50

## 2018-10-19 NOTE — Progress Notes (Signed)
Patient transported to room 224 via wheelchair by this RN. Beth, RN notified of patient's arrival.

## 2018-10-19 NOTE — Treatment Plan (Signed)
The patient was discussed during ICU multidisciplinary rounds.  He has transition off of insulin drip.  We are following recommendations by the diabetes nurse with regards to his insulin dosage.  He continues to have some issues with nausea and vomiting but these are more controlled.  If he continues to have issues with the same would recommend evaluation by GI though I suspect is likely secondary to gastroparesis.  Lipase was normal and transaminitis present on admission is resolving.  I discussed the case with Dr. Enid Baas, for continuity of care.  The patient will be transferred to the MedSurg floor.   Pulmonary/Critical Care will sign off please reconsult as needed.

## 2018-10-19 NOTE — Progress Notes (Signed)
[employer] - [place] at [facility]   PATIENT NAME: Wesley [name]    MR#:  [id number]  DATE OF BIRTH:  26-Aug-1996  SUBJECTIVE:  CHIEF COMPLAINT:   Chief Complaint  Patient presents with  . Emesis  . Hyperventilating   Patient already weaned off insulin drip.  Currently on subcutaneous insulin.  Reported to be having persistent nausea and vomiting but no abdominal pains.  Being transferred out of ICU today.  Critical care physician recommended gastroenterology consultation to evaluate for possible gastroparesis  REVIEW OF SYSTEMS:  Review of Systems  Constitutional: Negative for chills and fever.  HENT: Negative for hearing loss and tinnitus.   Eyes: Negative for blurred vision and double vision.  Respiratory: Negative for cough and hemoptysis.   Cardiovascular: Negative for chest pain and palpitations.  Gastrointestinal: Negative for abdominal pain, heartburn, nausea and vomiting.  Genitourinary: Negative for dysuria and urgency.  Musculoskeletal: Negative for myalgias and neck pain.  Skin: Negative for itching and rash.  Neurological: Negative for dizziness and headaches.  Psychiatric/Behavioral: Negative for depression and suicidal ideas.    DRUG ALLERGIES:  No Known Allergies VITALS:  Blood pressure (!) 146/98, pulse 60, temperature 98.9 F (37.2 C), temperature source Oral, resp. rate 18, height 5\' 9"  (1.753 m), weight 74.9 kg, SpO2 100 %. PHYSICAL EXAMINATION:   Physical Exam  Constitutional: He is oriented to person, place, and time and well-developed, well-nourished, and in no distress. No distress.  HENT:  Head: Normocephalic and atraumatic.  Eyes: Pupils are equal, round, and reactive to light. Conjunctivae are normal. Right eye exhibits no discharge.  Neck: Normal range of motion. Neck supple. No tracheal deviation present.  Cardiovascular: Normal rate and regular rhythm.  Pulmonary/Chest: Effort normal and breath sounds normal. He has no  wheezes.  Abdominal: Bowel sounds are normal. He exhibits no distension. There is no abdominal tenderness.  Musculoskeletal: Normal range of motion.        General: No edema.  Lymphadenopathy:    He has no cervical adenopathy.  Neurological: He is alert and oriented to person, place, and time.  Skin: Skin is warm. He is not diaphoretic.  Psychiatric: Affect and judgment normal.   LABORATORY PANEL:  Male CBC Recent Labs  Lab 10/19/18 0214  WBC 7.3  HGB 13.2  HCT 40.5  PLT 189   ------------------------------------------------------------------------------------------------------------------ Chemistries  Recent Labs  Lab 10/19/18 0214  NA 142  K 3.0*  CL 112*  CO2 20*  GLUCOSE 149*  BUN 6  CREATININE 0.82  CALCIUM 8.5*  MG 1.6*  AST 45*  ALT 38  ALKPHOS 81  BILITOT 1.2   RADIOLOGY:  No results found. ASSESSMENT AND PLAN:    1. DKA in patient with type 1 diabetes mellitus Patient adequately hydrated with IV fluids.  Was placed on insulin drip and initially admitted to stepdown unit. DKA resolved with anion gap down to normal.  Patient already weaned off insulin drip to subcutaneous insulin.  Most recent blood sugar of 149.  Blood sugars appear controlled on current regimen Glycosylated hemoglobin level of 12.3 suggestive of poorly controlled diabetics. Patient seen by diabetic nurse educator.  Patient admitted to running out of insulin due to cost and insurance issues. Will discuss with case manager in a.m. with recommendations regarding assistance with insulin.  2.  Intractable nausea and vomiting Denies any abdominal pains.  Critical care physician concerned about possible underlying gastroparesis.  She recommended gastroenterology consultation. Consult placed for gastroenterologist.  Follow-up on evaluation  and recommendations.  3.  Hypokalemia and hypomagnesemia Replaced in the ICU.  Follow-up on repeat levels in a.m.  Disposition; patient being transferred  out of ICU today.  DVT prophylaxis with Lovenox  CODE STATUS: FULL CODE  Management plans discussed with the patient,  and he is in agreement.  CODE STATUS: Full Code  TOTAL TIME TAKING CARE OF THIS PATIENT: 34 minutes.   More than 50% of the time was spent in counseling/coordination of care: YES  POSSIBLE D/C IN  AM, DEPENDING ON CLINICAL CONDITION.   Briena Swingler M.D on 10/19/2018 at 4:56 PM  Between 7am to 6pm - Pager - [phone]  After 6pm go to [email or web address] - Scientist, research (life sciences)password [email or web address]  [facility] [place] [facility]  Office  [phone]  CC: Primary care physician; [facility]  Note: This dictation was prepared with Nurse, children'sDragon dictation along with smaller phrase technology. Any transcriptional errors that result from this process are unintentional.

## 2018-10-19 NOTE — Progress Notes (Signed)
Inpatient Diabetes Program Recommendations  AACE/ADA: New Consensus Statement on Inpatient Glycemic Control (2015)  Target Ranges:  Prepandial:   less than 140 mg/dL      Peak postprandial:   less than 180 mg/dL (1-2 hours)      Critically ill patients:  140 - 180 mg/dL   Lab Results  Component Value Date   GLUCAP 170 (H) 10/19/2018   HGBA1C 12.3 (H) 10/17/2018    Review of Glycemic Control Results for GARDNER, HEBEL (MRN [id number]) as of 10/19/2018 08:44  Ref. Range 10/19/2018 03:00 10/19/2018 04:02 10/19/2018 05:00 10/19/2018 06:08 10/19/2018 07:10  Glucose-Capillary Latest Ref Range: 70 - 99 mg/dL 662 (H) 947 (H) 654 (H) 240 (H) 170 (H)   Diabetes history: Type 1 DM since age 23 Outpatient Diabetes medications:  Novolin 70/30 10 units bid (patient buys from [facility]) Current orders for Inpatient glycemic control:  Levemir 8 units daily (received first dose at 5:30 am), Novolog sensitive tid with meals  Inpatient Diabetes Program Recommendations:    Spoke with RN this AM.  Patient is still experiencing N/V.  He was only able to drink a small amount of juice this morning and did not eat any solids.  Based on home diabetes medications, he likely does need more basal insulin however since he is not able to eat, agree with using Levemir. Recommendations:  -  Please increase Levemir to 8 units bid.   - Increase Novolog correction to q 4 hours -  Once eating, he will also need the addition of Novolog meal coverage (however would not add until patient eating consistently).  Thanks,  Beryl Meager, RN, BC-ADM Inpatient Diabetes Coordinator Pager [phone] (8a-5p)

## 2018-10-20 DIAGNOSIS — R112 Nausea with vomiting, unspecified: Secondary | ICD-10-CM

## 2018-10-20 LAB — CBC
HCT: 40.6 % (ref 39.0–52.0)
Hemoglobin: 13.6 g/dL (ref 13.0–17.0)
MCH: 29.5 pg (ref 26.0–34.0)
MCHC: 33.5 g/dL (ref 30.0–36.0)
MCV: 88.1 fL (ref 80.0–100.0)
Platelets: 166 10*3/uL (ref 150–400)
RBC: 4.61 MIL/uL (ref 4.22–5.81)
RDW: 13.2 % (ref 11.5–15.5)
WBC: 6.1 10*3/uL (ref 4.0–10.5)
nRBC: 0 % (ref 0.0–0.2)

## 2018-10-20 LAB — BASIC METABOLIC PANEL
Anion gap: 8 (ref 5–15)
BUN: 5 mg/dL — ABNORMAL LOW (ref 6–20)
CO2: 24 mmol/L (ref 22–32)
Calcium: 8.6 mg/dL — ABNORMAL LOW (ref 8.9–10.3)
Chloride: 108 mmol/L (ref 98–111)
Creatinine, Ser: 0.66 mg/dL (ref 0.61–1.24)
GFR calc Af Amer: >60 mL/min (ref >60)
GFR calc non Af Amer: >60 mL/min (ref >60)
Glucose, Bld: 93 mg/dL (ref 70–99)
Potassium: 3.3 mmol/L — ABNORMAL LOW (ref 3.5–5.1)
SODIUM: 140 mmol/L (ref 135–145)

## 2018-10-20 LAB — GLUCOSE, CAPILLARY
GLUCOSE-CAPILLARY: 70 mg/dL (ref 70–99)
Glucose-Capillary: 94 mg/dL (ref 70–99)
Glucose-Capillary: 67 mg/dL — ABNORMAL LOW (ref 70–99)
Glucose-Capillary: 156 mg/dL — ABNORMAL HIGH (ref 70–99)
Glucose-Capillary: 259 mg/dL — ABNORMAL HIGH (ref 70–99)

## 2018-10-20 LAB — MAGNESIUM: MAGNESIUM: 1.6 mg/dL — AB (ref 1.7–2.4)

## 2018-10-20 LAB — PHOSPHORUS: Phosphorus: 3.8 mg/dL (ref 2.5–4.6)

## 2018-10-20 MED ORDER — INSULIN DETEMIR 100 UNIT/ML ~~LOC~~ SOLN
7.0000 [IU] | Freq: Two times a day (BID) | SUBCUTANEOUS | Status: DC
  Filled 2018-10-20 (×2): qty 0.07

## 2018-10-20 MED ORDER — POTASSIUM CHLORIDE CRYS ER 20 MEQ PO TBCR
40.0000 meq | EXTENDED_RELEASE_TABLET | Freq: Once | ORAL | Status: AC
  Administered 2018-10-20: 40 meq via ORAL
  Filled 2018-10-20: qty 2

## 2018-10-20 MED ORDER — MAGNESIUM OXIDE 400 (241.3 MG) MG PO TABS
800.0000 mg | ORAL_TABLET | Freq: Once | ORAL | Status: AC
  Administered 2018-10-20: 800 mg via ORAL
  Filled 2018-10-20: qty 2

## 2018-10-20 MED ORDER — INSULIN NPH ISOPHANE & REGULAR (70-30) 100 UNIT/ML ~~LOC~~ SUSP: 10.0000 [IU] | Freq: Two times a day (BID) | SUBCUTANEOUS | 0 refills | Status: DC

## 2018-10-20 NOTE — Care Management Note (Signed)
Case Management Note  Patient Details  Name: Wesley [name] MRN: [id number] Date of Birth: 05-02-1996   Notified by MD that patient to discharge today.  MD to discharge patient on 70/30.  Patient again confirms with MD that he will be able to afford the $25 copay at [facility].  RNCM confirms that patient has his copy of the application to [facility]  And Medication Management    Subjective/Objective:                    Action/Plan:   Expected Discharge Date:  10/20/18               Expected Discharge Plan:  Home/Self Care  In-House Referral:     Discharge planning Services  CM Consult, Medication Assistance, Indigent Health Clinic  Post Acute Care Choice:    Choice offered to:     DME Arranged:    DME Agency:     HH Arranged:    HH Agency:     Status of Service:  In process, will continue to follow  If discussed at Long Length of Stay Meetings, dates discussed:    Additional Comments:  Chapman Fitch, RN 10/20/2018, 5:02 PM

## 2018-10-20 NOTE — Discharge Summary (Signed)
[facility] [place] NAME: Wesley [name]    MR#:  [id number]  DATE OF BIRTH:  08-Feb-1996  DATE OF ADMISSION:  10/17/2018   ADMITTING PHYSICIAN: Hillary Bow, MD  DATE OF DISCHARGE: 10/20/2018.  PRIMARY CARE PHYSICIAN: [facility], [facility]   ADMISSION DIAGNOSIS:  Diabetic ketoacidosis without coma associated with type 1 diabetes mellitus ([place]) [E10.10] DISCHARGE DIAGNOSIS:  Active Problems:   DKA (diabetic ketoacidoses) ([place])   Acute renal failure ([place])  SECONDARY DIAGNOSIS:   Past Medical History:  Diagnosis Date  . Diabetes mellitus without complication ([place])    type 1 diabetes   HOSPITAL COURSE:  Chief complaint; nausea and vomiting  HPI; Wesley [name]  is a 23 y.o. male with a known history of type 1 diabetes mellitus on Levemir and NovoLog at home presents to the emergency room complaining of intractable vomiting and abdominal pain.  Patient has been found to have blood sugars in the 500s along with anion gap 25 and low bicarb.  DKA present.  Patient was admitted to the hospital. Patient ran out of his insulin 70/30 for 1 day 1 day prior to presentation.  Subsequently filled medication but was unable to take it due to nausea and vomiting.  HOSPITAL COURSE 1. DKA in patient with type 1 diabetes mellitus Patient adequately hydrated with IV fluids.  Was placed on insulin drip and initially admitted to stepdown unit. DKA resolved with anion gap down to normal.  Patient already weaned off insulin drip to subcutaneous insulin.  Blood sugars appear fairly controlled on current regimen.  Glycosylated hemoglobin level of 12.3 suggestive of poorly controlled diabetics. Patient seen by diabetic nurse educator.  I discussed with patient as well as case Freight forwarder.  Co-pay for insulin 70/30 will be $25 a month which patient is able to afford.  Already has some supplies at home.  Prescription given for the same.  Education  on importance of medication compliance reemphasized.  Follow-up with primary care physician for monitoring of blood sugars.  2.  Intractable nausea and vomiting Resolved. Denies any abdominal pains.  Critical care physician concerned about possible underlying gastroparesis.  Patient was seen by gastroenterologist on call Dr. Marius Ditch who informed me that patient is cleared for discharge from her standpoint.  Patient remains asymptomatic and tolerating diet well.  3.  Hypokalemia and hypomagnesemia Replaced prior to discharge  DISCHARGE CONDITIONS:  Stable CONSULTS OBTAINED:  Treatment Team:  Lucilla Lame, MD DRUG ALLERGIES:  No Known Allergies DISCHARGE MEDICATIONS:   Allergies as of 10/20/2018   No Known Allergies     Medication List    STOP taking these medications   insulin aspart 100 UNIT/ML injection Commonly known as:  NOVOLOG   insulin detemir 100 UNIT/ML injection Commonly known as:  LEVEMIR     TAKE these medications   blood glucose meter kit and supplies Kit Dispense based on patient and insurance preference. Use up to four times daily as directed. (FOR ICD-9 250.00, 250.01).   insulin NPH-regular Human (70-30) 100 UNIT/ML injection Commonly known as:  NOVOLIN 70/30 Inject 10 Units into the skin 2 (two) times daily with a meal.        DISCHARGE INSTRUCTIONS:   DIET:  1800 ADA diet DISCHARGE CONDITION:  Stable ACTIVITY:  Activity as tolerated OXYGEN:  Home Oxygen: No.  Oxygen Delivery: room air DISCHARGE LOCATION:  home   If you experience worsening of your admission symptoms, develop shortness of breath,  life threatening emergency, suicidal or homicidal thoughts you must seek medical attention immediately by calling 911 or calling your MD immediately  if symptoms less severe.  You Must read complete instructions/literature along with all the possible adverse reactions/side effects for all the Medicines you take and that have been prescribed to you.  Take any new Medicines after you have completely understood and accpet all the possible adverse reactions/side effects.   Please note  You were cared for by a hospitalist during your hospital stay. If you have any questions about your discharge medications or the care you received while you were in the hospital after you are discharged, you can call the unit and asked to speak with the hospitalist on call if the hospitalist that took care of you is not available. Once you are discharged, your primary care physician will handle any further medical issues. Please note that NO REFILLS for any discharge medications will be authorized once you are discharged, as it is imperative that you return to your primary care physician (or establish a relationship with a primary care physician if you do not have one) for your aftercare needs so that they can reassess your need for medications and monitor your lab values.    On the day of Discharge:  VITAL SIGNS:  Blood pressure (!) 143/96, pulse 73, temperature 98.6 F (37 C), temperature source Oral, resp. rate 20, height '5\' 9"'  (1.753 m), weight 75.1 kg, SpO2 99 %. PHYSICAL EXAMINATION:  GENERAL:  23 y.o.-year-old patient lying in the bed with no acute distress.  EYES: Pupils equal, round, reactive to light and accommodation. No scleral icterus. Extraocular muscles intact.  HEENT: Head atraumatic, normocephalic. Oropharynx and nasopharynx clear.  NECK:  Supple, no jugular venous distention. No thyroid enlargement, no tenderness.  LUNGS: Normal breath sounds bilaterally, no wheezing, rales,rhonchi or crepitation. No use of accessory muscles of respiration.  CARDIOVASCULAR: S1, S2 normal. No murmurs, rubs, or gallops.  ABDOMEN: Soft, non-tender, non-distended. Bowel sounds present. No organomegaly or mass.  EXTREMITIES: No pedal edema, cyanosis, or clubbing.  NEUROLOGIC: Cranial nerves II through XII are intact. Muscle strength 5/5 in all extremities. Sensation  intact. Gait not checked.  PSYCHIATRIC: The patient is alert and oriented x 3.  SKIN: No obvious rash, lesion, or ulcer.  DATA REVIEW:   CBC Recent Labs  Lab 10/20/18 0516  WBC 6.1  HGB 13.6  HCT 40.6  PLT 166    Chemistries  Recent Labs  Lab 10/19/18 0214 10/20/18 0516  NA 142 140  K 3.0* 3.3*  CL 112* 108  CO2 20* 24  GLUCOSE 149* 93  BUN 6 5*  CREATININE 0.82 0.66  CALCIUM 8.5* 8.6*  MG 1.6* 1.6*  AST 45*  --   ALT 38  --   ALKPHOS 81  --   BILITOT 1.2  --      Microbiology Results  Results for orders placed or performed during the hospital encounter of 10/17/18  MRSA PCR Screening     Status: None   Collection Time: 10/17/18  9:25 PM  Result Value Ref Range Status   MRSA by PCR NEGATIVE NEGATIVE Final    Comment:        The GeneXpert MRSA Assay (FDA approved for NASAL specimens only), is one component of a comprehensive MRSA colonization surveillance program. It is not intended to diagnose MRSA infection nor to guide or monitor treatment for MRSA infections. Performed at [facility], [address], [place], [place] [address]  RADIOLOGY:  No results found.   Management plans discussed with the patient, family and they are in agreement.  CODE STATUS: Full Code   TOTAL TIME TAKING CARE OF THIS PATIENT: 40 minutes.    Cherysh Epperly M.D on 10/20/2018 at 4:37 PM  Between 7am to 6pm - Pager - [phone]  After 6pm go to [email or web address] - Technical brewer [place] [facility]  Office  [phone]  CC: Primary care physician; [facility], [place]   Note: This dictation was prepared with Diplomatic Services operational officer dictation along with smaller phrase technology. Any transcriptional errors that result from this process are unintentional.

## 2018-10-20 NOTE — Progress Notes (Signed)
10/20/2018 5:55 PM  Sherre Scarlet to be D/C'd Home per MD order.  Discussed prescriptions and follow up appointments with the patient. Prescriptions given to patient, medication list explained in detail. Pt verbalized understanding.  Allergies as of 10/20/2018   No Known Allergies     Medication List    STOP taking these medications   insulin aspart 100 UNIT/ML injection Commonly known as:  NOVOLOG   insulin detemir 100 UNIT/ML injection Commonly known as:  LEVEMIR     TAKE these medications   blood glucose meter kit and supplies Kit Dispense based on patient and insurance preference. Use up to four times daily as directed. (FOR ICD-9 250.00, 250.01).   insulin NPH-regular Human (70-30) 100 UNIT/ML injection Commonly known as:  NOVOLIN 70/30 Inject 10 Units into the skin 2 (two) times daily with a meal.       Vitals:   10/20/18 0431 10/20/18 1134  BP: 125/83 (!) 143/96  Pulse: 64 73  Resp: 16 20  Temp: 98.8 F (37.1 C) 98.6 F (37 C)  SpO2: 100% 99%    Skin clean, dry and intact without evidence of skin break down, no evidence of skin tears noted. IV catheter discontinued intact. Site without signs and symptoms of complications. Dressing and pressure applied. Pt denies pain at this time. No complaints noted.  An After Visit Summary was printed and given to the patient. Patient escorted via [place], and D/C home via private auto.  Wesley [name]

## 2018-10-20 NOTE — Progress Notes (Signed)
[employer] - [place] at [facility]   PATIENT NAME: Wesley [name]    MR#:  [id number]  DATE OF BIRTH:  1996-10-05  SUBJECTIVE:  CHIEF COMPLAINT:   Chief Complaint  Patient presents with  . Emesis  . Hyperventilating   Patient previously weaned off insulin drip used for management of DKA.  Subsequently transferred out of ICU.  Patient reports improvement in nausea and vomiting.  Has some nausea responding but no more vomiting.  Appears to be tolerating diet.  No abdominal pains.  Awaiting GI input today.    REVIEW OF SYSTEMS:  Review of Systems  Constitutional: Negative for chills and fever.  HENT: Negative for hearing loss and tinnitus.   Eyes: Negative for blurred vision and double vision.  Respiratory: Negative for cough and hemoptysis.   Cardiovascular: Negative for chest pain and palpitations.  Gastrointestinal: Negative for abdominal pain, heartburn, nausea and vomiting.  Genitourinary: Negative for dysuria and urgency.  Musculoskeletal: Negative for myalgias and neck pain.  Skin: Negative for itching and rash.  Neurological: Negative for dizziness and headaches.  Psychiatric/Behavioral: Negative for depression and suicidal ideas.    DRUG ALLERGIES:  No Known Allergies VITALS:  Blood pressure (!) 143/96, pulse 73, temperature 98.6 F (37 C), temperature source Oral, resp. rate 20, height 5\' 9"  (1.753 m), weight 75.1 kg, SpO2 99 %. PHYSICAL EXAMINATION:   Physical Exam  Constitutional: He is oriented to person, place, and time and well-developed, well-nourished, and in no distress. No distress.  HENT:  Head: Normocephalic and atraumatic.  Eyes: Pupils are equal, round, and reactive to light. Conjunctivae are normal. Right eye exhibits no discharge.  Neck: Normal range of motion. Neck supple. No tracheal deviation present.  Cardiovascular: Normal rate and regular rhythm.  Pulmonary/Chest: Effort normal and breath sounds normal. He has no  wheezes.  Abdominal: Bowel sounds are normal. He exhibits no distension. There is no abdominal tenderness.  Musculoskeletal: Normal range of motion.        General: No edema.  Lymphadenopathy:    He has no cervical adenopathy.  Neurological: He is alert and oriented to person, place, and time.  Skin: Skin is warm. He is not diaphoretic.  Psychiatric: Affect and judgment normal.   LABORATORY PANEL:  Male CBC Recent Labs  Lab 10/20/18 0516  WBC 6.1  HGB 13.6  HCT 40.6  PLT 166   ------------------------------------------------------------------------------------------------------------------ Chemistries  Recent Labs  Lab 10/19/18 0214 10/20/18 0516  NA 142 140  K 3.0* 3.3*  CL 112* 108  CO2 20* 24  GLUCOSE 149* 93  BUN 6 5*  CREATININE 0.82 0.66  CALCIUM 8.5* 8.6*  MG 1.6* 1.6*  AST 45*  --   ALT 38  --   ALKPHOS 81  --   BILITOT 1.2  --    RADIOLOGY:  No results found. ASSESSMENT AND PLAN:    1. DKA in patient with type 1 diabetes mellitus Patient adequately hydrated with IV fluids.  Was placed on insulin drip and initially admitted to stepdown unit. DKA resolved with anion gap down to normal.  Patient already weaned off insulin drip to subcutaneous insulin.  Blood sugars appear fairly controlled on current regimen.  Glycosylated hemoglobin level of 12.3 suggestive of poorly controlled diabetics. Patient seen by diabetic nurse educator.  Patient admitted to running out of insulin due to cost and insurance issues. Will discuss with case manager in a.m. with recommendations regarding assistance with insulin.  2.  Intractable nausea and vomiting  Denies any abdominal pains.  Critical care physician concerned about possible underlying gastroparesis.  She recommended gastroenterology consultation. Consult placed for gastroenterologist.  Follow-up on evaluation and recommendations when done  3.  Hypokalemia and hypomagnesemia Being replaced. Follow-up on repeat levels  in a.m.  Disposition; possible discharge tomorrow if no further plans for work-up by GI and if patient tolerating diet well.  DVT prophylaxis with Lovenox  CODE STATUS: FULL CODE  Management plans discussed with the patient,  and he is in agreement.  CODE STATUS: Full Code  TOTAL TIME TAKING CARE OF THIS PATIENT: 33 minutes.   More than 50% of the time was spent in counseling/coordination of care: YES  POSSIBLE D/C IN  AM, DEPENDING ON CLINICAL CONDITION.   Ethleen Lormand M.D on 10/20/2018 at 3:07 PM  Between 7am to 6pm - Pager - [phone]  After 6pm go to [email or web address] - Scientist, research (life sciences) [place] [facility]  Office  [phone]  CC: Primary care physician; Center, [facility]  Note: This dictation was prepared with Nurse, children's dictation along with smaller phrase technology. Any transcriptional errors that result from this process are unintentional.

## 2018-10-20 NOTE — Consult Note (Signed)
Cephas Darby, MD [address]  [place],  [address]  Main: [phone]  Fax: [phone] Pager: [phone]   Consultation  Referring Provider:     No ref. provider found Primary Care Physician:  Center, [place] Primary Gastroenterologist: None         Reason for Consultation:     Nausea and vomiting  Date of Admission:  10/17/2018 Date of Consultation:  10/20/2018         HPI:   Wesley [name] is a 23 y.o. African-American male with history of type 1 diabetes admitted with DKA as he ran out of insulin due to lack of insurance.  His DKA has resolved but he had persistent nausea and vomiting.  He denies episodes of hematemesis.  Patient was managed in ICU for DKA and he was receiving scheduled antiemetics including metoclopramide.  He is transferred to the floor yesterday as his DKA resolved.  He was started on clear liquid diet and slowly advance diet today.  Patient denies further episodes of nausea and vomiting today and he is able to tolerate food well.  He did require Zofran yesterday and just 1 dose of Reglan since transfer to the floor.  Patient wishes to go home today as he thinks he feels fine.  He denies prior episodes in the past.  He did not have DKA in the past as he was compliant with insulin until this time.  He said he will [facility] with [facility] of diabetes.  His hemoglobin A1c is 12.3 Patient denies epigastric pain, melena.  NSAIDs: None  Antiplts/Anticoagulants/Anti thrombotics: None  GI Procedures: None  Past Medical History:  Diagnosis Date  . Diabetes mellitus without complication ([place])    type 1 diabetes    Past Surgical History:  Procedure Laterality Date  . HAND SURGERY    . INCISION AND DRAINAGE ABSCESS N/A 10/02/2015   Procedure: INCISION AND DRAINAGE ABSCESS;  Surgeon: Festus Aloe, MD;  Location: [facility] ORS;  Service: Urology;  Laterality: N/A;  . TONSILLECTOMY     . TONSILLECTOMY AND ADENOIDECTOMY    . UMBILICAL HERNIA REPAIR      Prior to Admission medications   Medication Sig Start Date End Date Taking? Authorizing Provider  blood glucose meter kit and supplies KIT Dispense based on patient and insurance preference. Use up to four times daily as directed. (FOR ICD-9 250.00, 250.01). 03/21/17   Gladstone Lighter, MD  insulin NPH-regular Human (NOVOLIN 70/30) (70-30) 100 UNIT/ML injection Inject 10 Units into the skin 2 (two) times daily with a meal. 10/20/18 11/19/18  Otila Back, MD    Family History  Problem Relation Age of Onset  . Diabetes Mellitus II Paternal Grandfather   . Hypertension Mother      Social History   Tobacco Use  . Smoking status: Current Some Day Smoker    Packs/day: 0.50    Types: Cigarettes  . Smokeless tobacco: Never Used  Substance Use Topics  . Alcohol use: Yes    Comment: occasional  . Drug use: No    Allergies as of 10/17/2018  . (No Known Allergies)    Review of Systems:    All systems reviewed and negative except where noted in HPI.   Physical Exam:  Vital signs in last 24 hours: Temp:  [98.6 F (37 C)-98.8 F (37.1 C)] 98.6 F (37 C) (01/08 1134) Pulse Rate:  [64-87] 73 (01/08 1134) Resp:  [16-20]  20 (01/08 1134) BP: (124-143)/(80-96) 143/96 (01/08 1134) SpO2:  [99 %-100 %] 99 % (01/08 1134) Weight:  [75.1 kg] 75.1 kg (01/08 0431) Last BM Date: 10/16/18 General:   Pleasant, cooperative in NAD Head:  Normocephalic and atraumatic. Eyes:   No icterus.   Conjunctiva pink. PERRLA. Ears:  Normal auditory acuity. Neck:  Supple; no masses or thyroidomegaly Lungs: Respirations even and unlabored. Lungs clear to auscultation bilaterally.   No wheezes, crackles, or rhonchi.  Heart:  Regular rate and rhythm;  Without murmur, clicks, rubs or gallops Abdomen:  Soft, nondistended, nontender. Normal bowel sounds. No appreciable masses or hepatomegaly.  No rebound or guarding.  Rectal:  Not performed. Msk:   Symmetrical without gross deformities.  Strength normal Extremities:  Without edema, cyanosis or clubbing. Neurologic:  Alert and oriented x3;  grossly normal neurologically. Skin:  Intact without significant lesions or rashes. Psych:  Alert and cooperative. Normal affect.  LAB RESULTS: CBC Latest Ref Rng & Units 10/20/2018 10/19/2018 10/17/2018  WBC 4.0 - 10.5 K/uL 6.1 7.3 11.5(H)  Hemoglobin 13.0 - 17.0 g/dL 13.6 13.2 15.8  Hematocrit 39.0 - 52.0 % 40.6 40.5 47.7  Platelets 150 - 400 K/uL 166 189 273    BMET BMP Latest Ref Rng & Units 10/20/2018 10/19/2018 10/18/2018  Glucose 70 - 99 mg/dL 93 149(H) 193(H)  BUN 6 - 20 mg/dL 5(L) 6 6  Creatinine 0.61 - 1.24 mg/dL 0.66 0.82 0.84  Sodium 135 - 145 mmol/L 140 142 139  Potassium 3.5 - 5.1 mmol/L 3.3(L) 3.0(L) 3.1(L)  Chloride 98 - 111 mmol/L 108 112(H) 111  CO2 22 - 32 mmol/L 24 20(L) 19(L)  Calcium 8.9 - 10.3 mg/dL 8.6(L) 8.5(L) 8.5(L)    LFT Hepatic Function Latest Ref Rng & Units 10/19/2018 10/17/2018 10/16/2018  Total Protein 6.5 - 8.1 g/dL 5.5(L) 7.6 8.5(H)  Albumin 3.5 - 5.0 g/dL 3.2(L) 4.7 5.3(H)  AST 15 - 41 U/L 45(H) 58(H) 81(H)  ALT 0 - 44 U/L 38 66(H) 66(H)  Alk Phosphatase 38 - 126 U/L 81 120 129(H)  Total Bilirubin 0.3 - 1.2 mg/dL 1.2 1.7(H) 1.0  Bilirubin, Direct 0.0 - 0.2 mg/dL 0.1 - -     STUDIES: No results found.    Impression / Plan:   Wesley [name] is a 22 y.o. male with type 1 diabetes, insulin-dependent, admitted with DKA and GI is consulted for intractable nausea and vomiting.  This is in the setting of DKA which is currently resolved and patient tolerating modified carb diet well and not requiring scheduled dose of antiemetics.  Counseled patient about tight control of diabetes and adherence to insulin.  He may have developed temporary gastroparesis in the setting of uncontrolled blood sugars or he may have erosive esophagitis in the setting of DKA.  Suggested him to take Prilosec 20 mg twice daily, over-the-counter  for about 4 to 6 weeks.  Educated him about small frequent meals as well.  No further work-up needed at this time from GI standpoint.  He can follow-up with [facility] if needed.  Okay to discharge home from GI standpoint  Thank you for involving me in the care of this patient.      LOS: 3 days   Sherri Sear, MD  10/20/2018, 5:53 PM   Note: This dictation was prepared with Dragon dictation along with smaller phrase technology. Any transcriptional errors that result from this process are unintentional.

## 2018-10-20 NOTE — Progress Notes (Addendum)
Inpatient Diabetes Program Recommendations  AACE/ADA: New Consensus Statement on Inpatient Glycemic Control   Target Ranges:  Prepandial:   less than 140 mg/dL      Peak postprandial:   less than 180 mg/dL (1-2 hours)      Critically ill patients:  140 - 180 mg/dL   Results for Wesley, [name] (MRN [id number]) as of 10/20/2018 08:34  Ref. Range 10/19/2018 07:10 10/19/2018 11:54 10/19/2018 13:52 10/19/2018 16:02 10/19/2018 19:58 10/19/2018 23:24 10/20/2018 04:32 10/20/2018 07:38 10/20/2018 08:04  Glucose-Capillary Latest Ref Range: 70 - 99 mg/dL 073 (H) 710 (H) 626 (H) 79 141 (H) 175 (H) 94 67 (L) 70   Review of Glycemic Control  Diabetes history: DM1 (makes NO insulin; requires basal, correction, and meal coverage insulin) Outpatient Diabetes medications: Novolin 70/30 10 units BID Current orders for Inpatient glycemic control: Levemir 8 units BID, Novolog 0-9 units Q4H  Inpatient Diabetes Program Recommendations: Insulin - Basal: Please consider decreasing Levemir to 7 units BID.   Insulin-Meal Coverage: Once patient is eating well, he will need Novolog meal coverage. Would recommend starting with Novolog 2 units TID with meals for meal coverage if patient eats at least 50% of meals.  Insulin-Correction: Once patient is eating well, please consider changing frequency of CBGs to ACHS and Novolog to 0-9 units TID with meals and Novolog 0-5 units QHS.  Thanks, Wesley Penner, RN, MSN, CDE Diabetes Coordinator Inpatient Diabetes Program [phone] (Team Pager from 8am to 5pm)

## 2019-02-17 ENCOUNTER — Emergency Department: Payer: BLUE CROSS/BLUE SHIELD

## 2019-02-17 ENCOUNTER — Encounter: Payer: Self-pay | Admitting: *Deleted

## 2019-02-17 ENCOUNTER — Emergency Department
Admission: EM | Admit: 2019-02-17 | Discharge: 2019-02-18 | Disposition: A | Discharge status: Home / Self Care | Payer: BLUE CROSS/BLUE SHIELD | Attending: Emergency Medicine | Admitting: Emergency Medicine

## 2019-02-17 ENCOUNTER — Other Ambulatory Visit: Payer: Self-pay

## 2019-02-17 DIAGNOSIS — M25561 Pain in right knee: Secondary | ICD-10-CM | POA: Diagnosis present

## 2019-02-17 DIAGNOSIS — E109 Type 1 diabetes mellitus without complications: Secondary | ICD-10-CM | POA: Insufficient documentation

## 2019-02-17 DIAGNOSIS — Z794 Long term (current) use of insulin: Secondary | ICD-10-CM | POA: Diagnosis not present

## 2019-02-17 DIAGNOSIS — F17210 Nicotine dependence, cigarettes, uncomplicated: Secondary | ICD-10-CM | POA: Diagnosis not present

## 2019-02-17 DIAGNOSIS — M23303 Other meniscus derangements, unspecified medial meniscus, right knee: Secondary | ICD-10-CM | POA: Insufficient documentation

## 2019-02-17 MED ORDER — MELOXICAM 15 MG PO TABS: 15.0000 mg | ORAL_TABLET | Freq: Every day | ORAL | 0 refills | Status: DC

## 2019-02-17 NOTE — ED Notes (Signed)
Knee immobilizer applied to R knee

## 2019-02-17 NOTE — ED Notes (Signed)
Patient c/o previous injury to right knee in January. Patient reports new fall today. Patient fell onto right knee. Swelling and reduced ROM noted on assessment.

## 2019-02-17 NOTE — ED Notes (Signed)
Pt educated about crutches; teach back method used; pt practicing with crutches with this RN as witness.

## 2019-02-17 NOTE — ED Triage Notes (Signed)
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  Pt has right knee pain.  Pt alert.

## 2019-02-17 NOTE — ED Provider Notes (Signed)
[facility] Emergency Department Provider Note  ____________________________________________  Time seen: Approximately 11:15 PM  I have reviewed the triage vital signs and the nursing notes.   HISTORY  Chief Complaint Knee Pain    HPI Wesley [name] is a 23 y.o. male who presents the emergency department complaining of right knee pain.  Patient reports that he had a previous history to the right knee but never had it evaluated.  Patient reports that he has intermittent clicking/catching sensations and intermittent knee pain.  He reports that yesterday he was getting out of his work truck, his shoelace caught on the seat and caused him to twist on his right knee.  Patient then fell landing directly on the knee.  Patient has had pain, edema with inability to fully extend or flex the knee since this injury.  No other injury or complaint.  No medications prior to arrival.  Patient does have a history of diabetes and has had DKA, sepsis and acute renal failure before.  No complaints with chronic medical problems.         Past Medical History:  Diagnosis Date  . Diabetes mellitus without complication ([place])    type 1 diabetes    Patient Active Problem List   Diagnosis Date Noted  . Acute renal failure ([place])   . DKA (diabetic ketoacidoses) ([place]) 09/01/2016  . Sepsis ([place]) 10/02/2015  . Scrotal abscess   . Diabetes mellitus with ketoacidosis ([employer]) 08/13/2012  . Type 1 diabetes mellitus ([place]) 02/06/2012    Past Surgical History:  Procedure Laterality Date  . HAND SURGERY    . INCISION AND DRAINAGE ABSCESS N/A 10/02/2015   Procedure: INCISION AND DRAINAGE ABSCESS;  Surgeon: Festus Aloe, MD;  Location: [facility] ORS;  Service: Urology;  Laterality: N/A;  . TONSILLECTOMY    . TONSILLECTOMY AND ADENOIDECTOMY    . UMBILICAL HERNIA REPAIR      Prior to Admission medications   Medication Sig Start Date End Date Taking? Authorizing Provider  blood glucose  meter kit and supplies KIT Dispense based on patient and insurance preference. Use up to four times daily as directed. (FOR ICD-9 250.00, 250.01). 03/21/17   Gladstone Lighter, MD  insulin NPH-regular Human (NOVOLIN 70/30) (70-30) 100 UNIT/ML injection Inject 10 Units into the skin 2 (two) times daily with a meal. 10/20/18 11/19/18  Otila Back, MD    Allergies Patient has no known allergies.  Family History  Problem Relation Age of Onset  . Diabetes Mellitus II Paternal Grandfather   . Hypertension Mother     Social History Social History   Tobacco Use  . Smoking status: Current Some Day Smoker    Packs/day: 0.50    Types: Cigarettes  . Smokeless tobacco: Never Used  Substance Use Topics  . Alcohol use: Yes    Comment: occasional  . Drug use: No     Review of Systems  Constitutional: No fever/chills Eyes: No visual changes.  Cardiovascular: no chest pain. Respiratory: no cough. No SOB. Gastrointestinal: No abdominal pain.  No nausea, no vomiting.   Musculoskeletal: Positive for right knee pain/injury Skin: Negative for rash, abrasions, lacerations, ecchymosis. Neurological: Negative for headaches, focal weakness or numbness. 10-point ROS otherwise negative.  ____________________________________________   PHYSICAL EXAM:  VITAL SIGNS: ED Triage Vitals  Enc Vitals Group     BP 02/17/19 2019 (!) 133/93     Pulse Rate 02/17/19 2019 85     Resp 02/17/19 2019 18     Temp 02/17/19 2019  98.3 F (36.8 C)     Temp Source 02/17/19 2019 Oral     SpO2 02/17/19 2019 99 %     Weight 02/17/19 2017 175 lb (79.4 kg)     Height 02/17/19 2017 '5\' 9"'  (1.753 m)     Head Circumference --      Peak Flow --      Pain Score 02/17/19 2017 7     Pain Loc --      Pain Edu? --      Excl. in [place]? --      Constitutional: Alert and oriented. Well appearing and in no acute distress. Eyes: Conjunctivae are normal. PERRL. EOMI. Head: Atraumatic. Neck: No stridor.    Cardiovascular: Normal  rate, regular rhythm. Normal S1 and S2.  Good peripheral circulation. Respiratory: Normal respiratory effort without tachypnea or retractions. Lungs CTAB. Good air entry to the bases with no decreased or absent breath sounds. Musculoskeletal: Full range of motion to all extremities. No gross deformities appreciated.  Visualization of the right knee reveals mild edema.  No overlying skin injury of abrasions, lacerations.  No ecchymosis.  Patient is able to extend and flex the knee, he is able to extend appropriately but does have limited flexion due to pain and edema.  Patient is tender to palpation along the medial joint line with no palpable abnormality.  Mild ballottement in the suprapatellar region.  No other tenderness to palpation.  No other palpable abnormality.  Varus, valgus, Lachman's is negative.  McMurray's is positive for probable medial meniscal derangement. Neurologic:  Normal speech and language. No gross focal neurologic deficits are appreciated.  Skin:  Skin is warm, dry and intact. No rash noted. Psychiatric: Mood and affect are normal. Speech and behavior are normal. Patient exhibits appropriate insight and judgement.   ____________________________________________   LABS (all labs ordered are listed, but only abnormal results are displayed)  Labs Reviewed - No data to display ____________________________________________  EKG   ____________________________________________  RADIOLOGY I personally viewed and evaluated these images as part of my medical decision making, as well as reviewing the written report by the radiologist.  Dg Knee Complete 4 Views Right  Result Date: 02/17/2019 CLINICAL DATA:  Fall.  Right knee pain EXAM: RIGHT KNEE - COMPLETE 4+ VIEW COMPARISON:  None. FINDINGS: Moderate joint effusion. No acute bony abnormality. Specifically, no fracture, subluxation, or dislocation. Joint spaces maintained. IMPRESSION: Moderate joint effusion.  No acute bony  abnormality. Electronically Signed   By: Rolm Baptise M.D.   On: 02/17/2019 20:50    ____________________________________________    PROCEDURES  Procedure(s) performed:    .Splint Application Date/Time: 02/16/8615 11:20 PM Performed by: Darletta Moll, PA-C Authorized by: Darletta Moll, PA-C   Consent:    Consent obtained:  Verbal   Consent given by:  Patient   Risks discussed:  Pain and swelling Pre-procedure details:    Sensation:  Normal Procedure details:    Laterality:  Right   Location:  Knee   Knee:  R knee   Splint type:  Knee immobilizer   Supplies:  Prefabricated splint Post-procedure details:    Pain:  Improved   Sensation:  Normal   Patient tolerance of procedure:  Tolerated well, no immediate complications      Medications - No data to display   ____________________________________________   INITIAL IMPRESSION / ASSESSMENT AND PLAN / ED COURSE  Pertinent labs & imaging results that were available during my care of the patient were reviewed by  me and considered in my medical decision making (see chart for details).  Review of the [place] CSRS was performed in accordance of the [place] prior to dispensing any controlled drugs.           Patient's diagnosis is consistent with medial meniscal derangement of the right knee.  Patient presented to the emergency department with right knee pain/injury.  Patient had an old previous injury that does have symptoms that would be consistent with a mild meniscal tear.  Patient had another injury yesterday which is resulted in increased pain, limited flexion.  Exam is concerning for medial meniscal derangement.  X-ray reveals no acute fractures but does feel a joint effusion.  Patient is placed in a knee immobilizer, crutches for ambulation.  Follow-up with orthopedics for further management.  Patient will be prescribed meloxicam for symptom relief. Patient is given ED precautions to return to the ED for any  worsening or new symptoms.     ____________________________________________  FINAL CLINICAL IMPRESSION(S) / ED DIAGNOSES  Final diagnoses:  Derangement of medial meniscus of right knee      NEW MEDICATIONS STARTED DURING THIS VISIT:  ED Discharge Orders    None          This chart was dictated using voice recognition software/Dragon. Despite best efforts to proofread, errors can occur which can change the meaning. Any change was purely unintentional.    Darletta Moll, PA-C 02/17/19 [place], [place], MD 02/18/19 2320

## 2019-02-24 ENCOUNTER — Other Ambulatory Visit: Payer: Self-pay | Admitting: Orthopedic Surgery

## 2019-02-24 DIAGNOSIS — M25561 Pain in right knee: Secondary | ICD-10-CM

## 2019-03-08 ENCOUNTER — Ambulatory Visit
Admission: RE | Admit: 2019-03-08 | Discharge: 2019-03-08 | Disposition: A | Discharge status: Home / Self Care | Payer: BLUE CROSS/BLUE SHIELD | Source: Ambulatory Visit | Attending: Orthopedic Surgery | Admitting: Orthopedic Surgery

## 2019-03-08 ENCOUNTER — Other Ambulatory Visit: Payer: Self-pay

## 2019-03-08 DIAGNOSIS — M25561 Pain in right knee: Secondary | ICD-10-CM | POA: Diagnosis not present

## 2019-03-14 ENCOUNTER — Other Ambulatory Visit: Payer: Self-pay

## 2019-03-14 ENCOUNTER — Encounter
Admission: RE | Admit: 2019-03-14 | Discharge: 2019-03-14 | Disposition: A | Discharge status: Home / Self Care | Payer: BC Managed Care – PPO | Source: Ambulatory Visit | Attending: Orthopedic Surgery | Admitting: Orthopedic Surgery

## 2019-03-14 DIAGNOSIS — Z01818 Encounter for other preprocedural examination: Secondary | ICD-10-CM | POA: Diagnosis present

## 2019-03-14 DIAGNOSIS — E119 Type 2 diabetes mellitus without complications: Secondary | ICD-10-CM | POA: Diagnosis not present

## 2019-03-14 DIAGNOSIS — Z1159 Encounter for screening for other viral diseases: Secondary | ICD-10-CM | POA: Diagnosis not present

## 2019-03-14 LAB — BASIC METABOLIC PANEL
Anion gap: 9 (ref 5–15)
BUN: 16 mg/dL (ref 6–20)
CO2: 26 mmol/L (ref 22–32)
Calcium: 9 mg/dL (ref 8.9–10.3)
Chloride: 103 mmol/L (ref 98–111)
Creatinine, Ser: 0.78 mg/dL (ref 0.61–1.24)
GFR calc Af Amer: >60 mL/min (ref >60)
GFR calc non Af Amer: >60 mL/min (ref >60)
Glucose, Bld: 175 mg/dL — ABNORMAL HIGH (ref 70–99)
Potassium: 4 mmol/L (ref 3.5–5.1)
Sodium: 138 mmol/L (ref 135–145)

## 2019-03-14 NOTE — Patient Instructions (Addendum)
Your procedure is scheduled on: 03-17-19 THURSDAY Report to Same Day Surgery 2nd floor medical mall [facility] Entrance-take elevator on left to 2nd floor.  Check in with surgery information desk.) To find out your arrival time please call [phone] between 1PM - 3PM on 03-16-19 [facility]  Remember: Instructions that are not followed completely may result in serious medical risk, up to and including death, or upon the discretion of your surgeon and anesthesiologist your surgery may need to be rescheduled.    _x___ 1. Do not eat food after midnight the night before your procedure. NO GUM OR CANDY AFTER MIDNIGHT. You may drink WATER up to 2 hours before you are scheduled to arrive at the hospital for your procedure.  Do not drink WATER within 2 hours of your scheduled arrival to the hospital.  Type 1 and type 2 diabetics should only drink water.   ____Ensure clear carbohydrate drink on the way to the hospital for bariatric patients  ____Ensure clear carbohydrate drink 3 hours before surgery for Dr [place] [facility] patients if physician instructed.    __x__ 2. No Alcohol for 24 hours before or after surgery.   __x__3. No Smoking or e-cigarettes for 24 prior to surgery.  Do not use any chewable tobacco products for at least 6 hour prior to surgery   ____  4. Bring all medications with you on the day of surgery if instructed.    __x__ 5. Notify your doctor if there is any change in your medical condition     (cold, fever, infections).    x___6. On the morning of surgery brush your teeth with toothpaste and water.  You may rinse your mouth with mouth wash if you wish.  Do not swallow any toothpaste or mouthwash.   Do not wear jewelry, make-up, hairpins, clips or nail polish.  Do not wear lotions, powders, or perfumes. You may wear deodorant.  Do not shave 48 hours prior to surgery. Men may shave face and neck.  Do not bring valuables to the hospital.    [facility] is not responsible for any  belongings or valuables.               Contacts, dentures or bridgework may not be worn into surgery.  Leave your suitcase in the car. After surgery it may be brought to your room.  For patients admitted to the hospital, discharge time is determined by your treatment team.  _  Patients discharged the day of surgery will not be allowed to drive home.  You will need someone to drive you home and stay with you the night of your procedure.    Please read over the following fact sheets that you were given:   [facility] Preparing for Surgery  ____ Take anti-hypertensive listed below, cardiac, seizure, asthma, anti-reflux and psychiatric medicines. These include:  1. NONE  2.  3.  4.  5.  6.  ____Fleets enema or Magnesium Citrate as directed.   _x___ Use CHG Soap or sage wipes as directed on instruction sheet   ____ Use inhalers on the day of surgery and bring to hospital day of surgery  ____ Stop Metformin and Janumet 2 days prior to surgery.    _X___ Take 1/2 of usual insulin dose the night before surgery and none on the morning surgery-NO INSULIN THE MORNING OF SURGERY  ____ Follow recommendations from Cardiologist, Pulmonologist or PCP regarding stopping Aspirin, Coumadin, Plavix ,Eliquis, Effient, or Pradaxa, and Pletal.  X____Stop Anti-inflammatories such  as Advil, Aleve, Ibuprofen, Motrin, Naproxen,MELOXICAM (MOBIC) Naprosyn, Goodies powders or aspirin products NOW-OK to take Tylenol    ____ Stop supplements until after surgery.    ____ Bring C-Pap to the hospital.

## 2019-03-15 LAB — NOVEL CORONAVIRUS, NAA (HOSP ORDER, SEND-OUT TO REF LAB; TAT 18-24 HRS): SARS-CoV-2, NAA: NOT DETECTED

## 2019-03-16 ENCOUNTER — Encounter: Payer: Self-pay | Admitting: *Deleted

## 2019-03-16 MED ORDER — CEFAZOLIN SODIUM-DEXTROSE 2-4 GM/100ML-% IV SOLN
2.0000 g | Freq: Once | INTRAVENOUS | Status: AC
  Administered 2019-03-17: 2 g via INTRAVENOUS

## 2019-03-17 ENCOUNTER — Other Ambulatory Visit: Payer: Self-pay

## 2019-03-17 ENCOUNTER — Ambulatory Visit: Payer: BC Managed Care – PPO | Admitting: Anesthesiology

## 2019-03-17 ENCOUNTER — Encounter: Payer: Self-pay | Admitting: *Deleted

## 2019-03-17 ENCOUNTER — Encounter
Admission: RE | Disposition: A | Discharge status: Home / Self Care | Payer: Self-pay | Source: Home / Self Care | Attending: Orthopedic Surgery

## 2019-03-17 ENCOUNTER — Ambulatory Visit
Admission: RE | Admit: 2019-03-17 | Discharge: 2019-03-17 | Disposition: A | Discharge status: Home / Self Care | Payer: BC Managed Care – PPO | Attending: Orthopedic Surgery | Admitting: Orthopedic Surgery

## 2019-03-17 DIAGNOSIS — X58XXXA Exposure to other specified factors, initial encounter: Secondary | ICD-10-CM | POA: Insufficient documentation

## 2019-03-17 DIAGNOSIS — S83231A Complex tear of medial meniscus, current injury, right knee, initial encounter: Secondary | ICD-10-CM | POA: Diagnosis not present

## 2019-03-17 DIAGNOSIS — Z794 Long term (current) use of insulin: Secondary | ICD-10-CM | POA: Insufficient documentation

## 2019-03-17 DIAGNOSIS — F17200 Nicotine dependence, unspecified, uncomplicated: Secondary | ICD-10-CM | POA: Diagnosis not present

## 2019-03-17 DIAGNOSIS — E109 Type 1 diabetes mellitus without complications: Secondary | ICD-10-CM | POA: Insufficient documentation

## 2019-03-17 DIAGNOSIS — S83211A Bucket-handle tear of medial meniscus, current injury, right knee, initial encounter: Secondary | ICD-10-CM | POA: Diagnosis present

## 2019-03-17 HISTORY — PX: KNEE ARTHROSCOPY WITH MEDIAL MENISECTOMY: SHX5651

## 2019-03-17 LAB — URINE DRUG SCREEN, QUALITATIVE (ARMC ONLY)
Amphetamines, Ur Screen: POSITIVE — AB
Barbiturates, Ur Screen: NOT DETECTED
Benzodiazepine, Ur Scrn: NOT DETECTED
Cannabinoid 50 Ng, Ur ~~LOC~~: POSITIVE — AB
Cocaine Metabolite,Ur ~~LOC~~: NOT DETECTED
MDMA (Ecstasy)Ur Screen: NOT DETECTED
Methadone Scn, Ur: NOT DETECTED
Opiate, Ur Screen: NOT DETECTED
Phencyclidine (PCP) Ur S: NOT DETECTED
Tricyclic, Ur Screen: NOT DETECTED

## 2019-03-17 LAB — GLUCOSE, CAPILLARY
Glucose-Capillary: 183 mg/dL — ABNORMAL HIGH (ref 70–99)
Glucose-Capillary: 142 mg/dL — ABNORMAL HIGH (ref 70–99)

## 2019-03-17 SURGERY — ARTHROSCOPY, KNEE, WITH MEDIAL MENISCECTOMY
Anesthesia: General | Site: Knee | Laterality: Right

## 2019-03-17 MED ORDER — LACTATED RINGERS IV SOLN
INTRAVENOUS | Status: DC | PRN
  Administered 2019-03-17: 12 mL

## 2019-03-17 MED ORDER — IBUPROFEN 800 MG PO TABS: 800.0000 mg | ORAL_TABLET | Freq: Three times a day (TID) | ORAL | 1 refills | Status: AC

## 2019-03-17 MED ORDER — HYDROMORPHONE HCL 1 MG/ML IJ SOLN
INTRAMUSCULAR | Status: AC
  Administered 2019-03-17: 0.25 mg via INTRAVENOUS
  Filled 2019-03-17: qty 1

## 2019-03-17 MED ORDER — BUPIVACAINE HCL (PF) 0.5 % IJ SOLN
INTRAMUSCULAR | Status: AC
  Filled 2019-03-17: qty 30

## 2019-03-17 MED ORDER — LIDOCAINE-EPINEPHRINE (PF) 1 %-1:200000 IJ SOLN
INTRAMUSCULAR | Status: AC
  Filled 2019-03-17: qty 30

## 2019-03-17 MED ORDER — FENTANYL CITRATE (PF) 100 MCG/2ML IJ SOLN
INTRAMUSCULAR | Status: AC
  Filled 2019-03-17: qty 2

## 2019-03-17 MED ORDER — MIDAZOLAM HCL 2 MG/2ML IJ SOLN
INTRAMUSCULAR | Status: AC
  Filled 2019-03-17: qty 2

## 2019-03-17 MED ORDER — HYDROMORPHONE HCL 1 MG/ML IJ SOLN
0.2500 mg | INTRAMUSCULAR | Status: DC | PRN
  Administered 2019-03-17 (×3): 0.25 mg via INTRAVENOUS

## 2019-03-17 MED ORDER — MIDAZOLAM HCL 2 MG/2ML IJ SOLN
INTRAMUSCULAR | Status: DC | PRN
  Administered 2019-03-17: 2 mg via INTRAVENOUS

## 2019-03-17 MED ORDER — BUPIVACAINE HCL (PF) 0.5 % IJ SOLN
INTRAMUSCULAR | Status: DC | PRN
  Administered 2019-03-17: 5 mL

## 2019-03-17 MED ORDER — SODIUM CHLORIDE 0.9 % IV SOLN
INTRAVENOUS | Status: DC
  Administered 2019-03-17 (×2): via INTRAVENOUS

## 2019-03-17 MED ORDER — EPINEPHRINE (ANAPHYLAXIS) 30 MG/30ML IJ SOLN
INTRAMUSCULAR | Status: AC
  Filled 2019-03-17: qty 30

## 2019-03-17 MED ORDER — ONDANSETRON HCL 4 MG/2ML IJ SOLN
INTRAMUSCULAR | Status: AC
  Filled 2019-03-17: qty 2

## 2019-03-17 MED ORDER — HYDROMORPHONE HCL 1 MG/ML IJ SOLN
INTRAMUSCULAR | Status: AC
  Filled 2019-03-17: qty 1

## 2019-03-17 MED ORDER — ONDANSETRON HCL 4 MG/2ML IJ SOLN
INTRAMUSCULAR | Status: DC | PRN
  Administered 2019-03-17: 4 mg via INTRAVENOUS

## 2019-03-17 MED ORDER — DEXMEDETOMIDINE HCL 200 MCG/2ML IV SOLN
INTRAVENOUS | Status: DC | PRN
  Administered 2019-03-17: 4 ug via INTRAVENOUS
  Administered 2019-03-17: 8 ug via INTRAVENOUS

## 2019-03-17 MED ORDER — DEXAMETHASONE SODIUM PHOSPHATE 10 MG/ML IJ SOLN
INTRAMUSCULAR | Status: DC | PRN
  Administered 2019-03-17: 4 mg via INTRAVENOUS

## 2019-03-17 MED ORDER — ACETAMINOPHEN 500 MG PO TABS: 1000.0000 mg | ORAL_TABLET | Freq: Three times a day (TID) | ORAL | 2 refills | Status: DC

## 2019-03-17 MED ORDER — DEXMEDETOMIDINE HCL IN NACL 200 MCG/50ML IV SOLN
INTRAVENOUS | Status: DC | PRN
  Administered 2019-03-17: 8 ug via INTRAVENOUS

## 2019-03-17 MED ORDER — HYDROCODONE-ACETAMINOPHEN 5-325 MG PO TABS: 1.0000 | ORAL_TABLET | ORAL | 0 refills | Status: DC | PRN

## 2019-03-17 MED ORDER — FENTANYL CITRATE (PF) 100 MCG/2ML IJ SOLN
INTRAMUSCULAR | Status: DC | PRN
  Administered 2019-03-17 (×2): 50 ug via INTRAVENOUS
  Administered 2019-03-17 (×2): 25 ug via INTRAVENOUS
  Administered 2019-03-17: 50 ug via INTRAVENOUS

## 2019-03-17 MED ORDER — PROPOFOL 10 MG/ML IV BOLUS
INTRAVENOUS | Status: AC
  Filled 2019-03-17: qty 40

## 2019-03-17 MED ORDER — FAMOTIDINE 20 MG PO TABS
ORAL_TABLET | ORAL | Status: AC
  Administered 2019-03-17: 07:00:00 20 mg via ORAL
  Filled 2019-03-17: qty 1

## 2019-03-17 MED ORDER — ASPIRIN EC 325 MG PO TBEC: 325.0000 mg | DELAYED_RELEASE_TABLET | Freq: Every day | ORAL | 0 refills | Status: AC

## 2019-03-17 MED ORDER — FAMOTIDINE 20 MG PO TABS
20.0000 mg | ORAL_TABLET | Freq: Once | ORAL | Status: AC
  Administered 2019-03-17: 07:00:00 20 mg via ORAL

## 2019-03-17 MED ORDER — PROPOFOL 10 MG/ML IV BOLUS
INTRAVENOUS | Status: DC | PRN
  Administered 2019-03-17: 200 mg via INTRAVENOUS

## 2019-03-17 MED ORDER — ONDANSETRON 4 MG PO TBDP: 4.0000 mg | ORAL_TABLET | Freq: Three times a day (TID) | ORAL | 0 refills | Status: DC | PRN

## 2019-03-17 MED ORDER — LIDOCAINE HCL (PF) 2 % IJ SOLN
INTRAMUSCULAR | Status: AC
  Filled 2019-03-17: qty 10

## 2019-03-17 MED ORDER — LIDOCAINE-EPINEPHRINE 1 %-1:100000 IJ SOLN
INTRAMUSCULAR | Status: DC | PRN
  Administered 2019-03-17: 5 mL

## 2019-03-17 MED ORDER — CEFAZOLIN SODIUM-DEXTROSE 2-4 GM/100ML-% IV SOLN
INTRAVENOUS | Status: AC
  Filled 2019-03-17: qty 100

## 2019-03-17 SURGICAL SUPPLY — 64 items
"PENCIL ELECTRO HAND CTR " (MISCELLANEOUS) IMPLANT
ADAPTER IRRIG TUBE 2 SPIKE SOL (ADAPTER) ×6 IMPLANT
BANDAGE ACE 6X5 VEL STRL LF (GAUZE/BANDAGES/DRESSINGS) ×3 IMPLANT
BLADE SURG SZ11 CARB STEEL (BLADE) ×3 IMPLANT
BNDG COHESIVE 6X5 TAN STRL LF (GAUZE/BANDAGES/DRESSINGS) ×3 IMPLANT
BNDG ESMARK 6X12 TAN STRL LF (GAUZE/BANDAGES/DRESSINGS) ×3 IMPLANT
BNDG STRETCH 4X75 STRL LF (GAUZE/BANDAGES/DRESSINGS) ×2 IMPLANT
BRACE KNEE POST OP SHORT (BRACE) ×2 IMPLANT
BUR RADIUS 3.5 (BURR) IMPLANT
BUR RADIUS 4.0X18.5 (BURR) IMPLANT
CARTRIDGE SUT 2-0 NONSTITCH (Anchor) ×10 IMPLANT
CAST PADDING 6X4YD ST 30248 (SOFTGOODS) ×2
CHLORAPREP W/TINT 26 (MISCELLANEOUS) ×3 IMPLANT
CLOSURE WOUND 1/2 X4 (GAUZE/BANDAGES/DRESSINGS)
COOLER POLAR GLACIER W/PUMP (MISCELLANEOUS) ×3 IMPLANT
COVER WAND RF STERILE (DRAPES) ×3 IMPLANT
CUFF TOURN SGL QUICK 24 (TOURNIQUET CUFF)
CUFF TOURN SGL QUICK 30 (TOURNIQUET CUFF) ×2
CUFF TRNQT CYL 24X4X16.5-23 (TOURNIQUET CUFF) IMPLANT
CUFF TRNQT CYL 30X4X21-28X (TOURNIQUET CUFF) IMPLANT
DEVICE SUCT BLK HOLE OR FLOOR (MISCELLANEOUS) ×3 IMPLANT
DRAPE IMP U-DRAPE 54X76 (DRAPES) ×3 IMPLANT
DRAPE LEGGINS SURG 28X43 STRL (DRAPES) IMPLANT
ELECT REM PT RETURN 9FT ADLT (ELECTROSURGICAL)
ELECTRODE REM PT RTRN 9FT ADLT (ELECTROSURGICAL) IMPLANT
GAUZE SPONGE 4X4 12PLY STRL (GAUZE/BANDAGES/DRESSINGS) ×3 IMPLANT
GAUZE XEROFORM 1X8 LF (GAUZE/BANDAGES/DRESSINGS) ×2 IMPLANT
GLOVE BIOGEL PI IND STRL 8 (GLOVE) ×1 IMPLANT
GLOVE BIOGEL PI INDICATOR 8 (GLOVE) ×2
GLOVE SURG ORTHO 8.0 STRL STRW (GLOVE) ×6 IMPLANT
GOWN STRL REUS W/ TWL LRG LVL3 (GOWN DISPOSABLE) ×1 IMPLANT
GOWN STRL REUS W/ TWL XL LVL3 (GOWN DISPOSABLE) ×1 IMPLANT
GOWN STRL REUS W/TWL LRG LVL3 (GOWN DISPOSABLE) ×2
GOWN STRL REUS W/TWL XL LVL3 (GOWN DISPOSABLE) ×2
IV LACTATED RINGER IRRG 3000ML (IV SOLUTION) ×24
IV LR IRRIG 3000ML ARTHROMATIC (IV SOLUTION) ×4 IMPLANT
KIT TURNOVER KIT A (KITS) ×3 IMPLANT
MANAGER SUT NOVOCUT (CUTTER) ×2 IMPLANT
MANIFOLD NEPTUNE II (INSTRUMENTS) ×3 IMPLANT
MAT ABSORB  FLUID 56X50 GRAY (MISCELLANEOUS) ×4
MAT ABSORB FLUID 56X50 GRAY (MISCELLANEOUS) ×2 IMPLANT
NDL MAYO CATGUT SZ5 (NEEDLE)
NDL SUT 5 .5 CRC TPR PNT MAYO (NEEDLE) IMPLANT
NEEDLE HYPO 22GX1.5 SAFETY (NEEDLE) ×3 IMPLANT
NOVOSTICH PRO MENISCAL 2-0 (Miscellaneous) ×3 IMPLANT
PACK ARTHROSCOPY KNEE (MISCELLANEOUS) ×3 IMPLANT
PAD ABD DERMACEA PRESS 5X9 (GAUZE/BANDAGES/DRESSINGS) ×4 IMPLANT
PAD WRAPON POLAR KNEE (MISCELLANEOUS) ×1 IMPLANT
PADDING CAST COTTON 6X4 ST (SOFTGOODS) ×1 IMPLANT
PENCIL ELECTRO HAND CTR (MISCELLANEOUS) IMPLANT
SET TUBE SUCT SHAVER OUTFL 24K (TUBING) ×3 IMPLANT
SET TUBE TIP INTRA-ARTICULAR (MISCELLANEOUS) ×5 IMPLANT
STRIP CLOSURE SKIN 1/2X4 (GAUZE/BANDAGES/DRESSINGS) IMPLANT
SUT ETHILON 3-0 FS-10 30 BLK (SUTURE) ×3
SUT MNCRL AB 4-0 PS2 18 (SUTURE) IMPLANT
SUT VIC AB 0 CT2 27 (SUTURE) IMPLANT
SUT VIC AB 2-0 CT2 27 (SUTURE) IMPLANT
SUTURE EHLN 3-0 FS-10 30 BLK (SUTURE) ×1 IMPLANT
SYSTEM NVSTCH PRO MENISCAL 2-0 (Miscellaneous) IMPLANT
TOWEL OR 17X26 4PK STRL BLUE (TOWEL DISPOSABLE) ×6 IMPLANT
TUBING ARTHRO INFLOW-ONLY STRL (TUBING) ×3 IMPLANT
WAND HAND CNTRL MULTIVAC 50 (MISCELLANEOUS) IMPLANT
WAND WEREWOLF FLOW 90D (MISCELLANEOUS) IMPLANT
WRAPON POLAR PAD KNEE (MISCELLANEOUS) ×3

## 2019-03-17 NOTE — Anesthesia Procedure Notes (Signed)
Procedure Name: LMA Insertion Date/Time: 03/17/2019 7:43 AM Performed by: Manning Charity, CRNA Pre-anesthesia Checklist: Patient identified, Emergency Drugs available, Suction available and Patient being monitored Patient Re-evaluated:Patient Re-evaluated prior to induction Oxygen Delivery Method: Circle system utilized Preoxygenation: Pre-oxygenation with 100% oxygen Induction Type: IV induction Ventilation: Mask ventilation without difficulty LMA: LMA inserted LMA Size: 4.5 Number of attempts: 1 Tube secured with: Tape Dental Injury: Teeth and Oropharynx as per pre-operative assessment

## 2019-03-17 NOTE — Transfer of Care (Signed)
Immediate Anesthesia Transfer of Care Note  Patient: Wesley [name]  Procedure(s) Performed: KNEE ARTHROSCOPY WITH MEDIAL MENISECTOMY, POSSIBLE CHONDROPLASTY RIGHT - DIABETIC (Right Knee)  Patient Location: PACU  Anesthesia Type:General  Level of Consciousness: drowsy and patient cooperative  Airway & Oxygen Therapy: Patient Spontanous Breathing and Patient connected to face mask oxygen  Post-op Assessment: Report given to RN and Post -op Vital signs reviewed and stable  Post vital signs: Reviewed and stable  Last Vitals:  Vitals Value Taken Time  BP    Temp    Pulse    Resp    SpO2      Last Pain:  Vitals:   03/17/19 1038  TempSrc:   PainSc: (P) Asleep         Complications: No apparent anesthesia complications

## 2019-03-17 NOTE — H&P (Addendum)
Paper H&P to be scanned into permanent record. H&P reviewed. No significant changes noted.  Heart: regular rate & rhythm Lungs: chest sounds clear  Patient drug screen positive for amphetamines. Discussed this with Anesthesiology team and patient is at increased risk of anesthetic complications. Given the somewhat urgent nature of this surgery given that he has a flipped bucket handle meniscus tear, we agreed to proceed given the low, but present, risk of complication. This was discussed with the patient and he agrees to proceed as well.

## 2019-03-17 NOTE — Anesthesia Preprocedure Evaluation (Addendum)
Anesthesia Evaluation  Patient identified by MRN, date of birth, ID band Patient awake    Reviewed: Allergy & Precautions, H&P , NPO status , Patient's Chart, lab work & pertinent test results  Airway Mallampati: II  TM Distance: >3 FB     Dental  (+) Teeth Intact   Pulmonary Current Smoker,    breath sounds clear to auscultation       Cardiovascular negative cardio ROS   Rhythm:regular Rate:Normal - Systolic murmurs and - Diastolic murmurs    Neuro/Psych negative neurological ROS  negative psych ROS   GI/Hepatic negative GI ROS, Neg liver ROS,   Endo/Other  diabetes, Type 1, Insulin Dependent  Renal/GU Renal disease     Musculoskeletal   Abdominal   Peds  Hematology negative hematology ROS (+)   Anesthesia Other Findings Past Medical History: No date: Diabetes mellitus without complication (HCC)     Comment:  type 1 diabetes  Past Surgical History: No date: HAND SURGERY 10/02/2015: INCISION AND DRAINAGE ABSCESS; N/A     Comment:  Procedure: INCISION AND DRAINAGE ABSCESS;  Surgeon:               Jerilee Field, MD;  Location: [facility] ORS;  Service:               Urology;  Laterality: N/A; No date: TONSILLECTOMY No date: TONSILLECTOMY AND ADENOIDECTOMY No date: UMBILICAL HERNIA REPAIR  BMI    Body Mass Index:  27.02 kg/m      Reproductive/Obstetrics negative OB ROS                            Anesthesia Physical Anesthesia Plan  ASA: II  Anesthesia Plan: General LMA   Post-op Pain Management:    Induction:   PONV Risk Score and Plan: Dexamethasone, Ondansetron, Midazolam and Treatment may vary due to age or medical condition  Airway Management Planned:   Additional Equipment:   Intra-op Plan:   Post-operative Plan:   Informed Consent: I have reviewed the patients History and Physical, chart, labs and discussed the procedure including the risks, benefits and  alternatives for the proposed anesthesia with the patient or authorized representative who has indicated his/her understanding and acceptance.     Dental Advisory Given  Plan Discussed with: Anesthesiologist and CRNA  Anesthesia Plan Comments: (Pt UDS positive for amphetamines.  Discussed with surgeon and patient.  Patient denies amphetamine use and is unsure how this could have resulted positive.  UDS also positive for cannabinoid which he endorses.  Given time sensitive nature of procedure and patient's limited knee ROM, all parties agreed that benefits to surgery with GA outweigh the risks despite positive amphetamine screen, so we will proceed.)       Anesthesia Quick Evaluation

## 2019-03-17 NOTE — Discharge Instructions (Signed)
AMBULATORY SURGERY  DISCHARGE INSTRUCTIONS   1) The drugs that you were given will stay in your system until tomorrow so for the next 24 hours you should not:  A) Drive an automobile B) Make any legal decisions C) Drink any alcoholic beverage   2) You may resume regular meals tomorrow.  Today it is better to start with liquids and gradually work up to solid foods.  You may eat anything you prefer, but it is better to start with liquids, then soup and crackers, and gradually work up to solid foods.   3) Please notify your doctor immediately if you have any unusual bleeding, trouble breathing, redness and pain at the surgery site, drainage, fever, or pain not relieved by medication. 4)   5) Your post-operative visit with Dr.                                     is: Date:                        Time:    Please call to schedule your post-operative visit.  6) Additional Instructions:      Arthroscopic Knee Surgery - Meniscus Repair   Post-Op Instructions   1. Bracing or crutches: Crutches will be provided at the time of discharge from the surgery center. Keep brace locked in extension at all times except as directed by physical therapy.    2. Ice: You may be provided with a device [facility]) that allows you to ice the affected area effectively. Otherwise you can ice manually.    3. Driving:  Plan on not driving for at least four weeks. Please note that you are advised NOT to drive while taking narcotic pain medications as you may be impaired and unsafe to drive.   4. Activity: Ankle pumps several times an hour while awake to prevent blood clots. Weight bearing: Toe-Touch/Flat foot BEARING FOR 4 WEEKS. Use crutches for at least 4 weeks, if not 6 based on your surgery. Bending and straightening the knee is unlimited, but do not flex your knee past 90 degrees until cleared by your therapist. Elevate knee above heart level as much as possible for one week. Avoid standing more than 5  minutes (consecutively) for the first week. No exercise involving the knee until cleared by the surgeon or physical therapist.  Avoid long distance travel for 4 weeks.   5. Medications:  - You have been provided a prescription for narcotic pain medicine. After surgery, take 1-2 narcotic tablets every 4 hours if needed for severe pain. If it has tylenol (acetaminophen), please do not take a total of more than 3000mg /day of tylenol.  - A prescription for anti-nausea medication will be provided in case the narcotic medicine causes nausea - take 1 tablet every 6 hours only if nauseated.  - Take ibuprofen 800 mg every 8 hours with food to reduce post-operative knee swelling. DO NOT STOP IBUPROFEN POST-OP UNTIL INSTRUCTED TO DO SO at first post-op office visit (10-14 days after surgery).  - Take enteric coated aspirin 325 mg once daily for 4 weeks to prevent blood clots.  -Take tylenol 1000 every 8 hours for pain.  May stop tylenol 3 days after surgery or when you are having minimal pain. If your narcotic has tylenol (acetaminophen), please do not take a total of more than 3000mg /day of tylenol.  If you are taking prescription medication for anxiety, depression, insomnia, muscle spasm, chronic pain, or for attention deficit disorder you are advised that you are at a higher risk of adverse effects with use of narcotics post-op, including narcotic addiction/dependence, depressed breathing, death. If you use non-prescribed substances: alcohol, marijuana, cocaine, heroin, methamphetamines, etc., you are at a higher risk of adverse effects with use of narcotics post-op, including narcotic addiction/dependence, depressed breathing, death. You are advised that taking > 50 morphine milligram equivalents (MME) of narcotic pain medication per day results in twice the risk of overdose or death. For your prescription provided: oxycodone 5 mg - taking more than 6 tablets per day. Be advised that we will prescribe  narcotics short-term, for acute post-operative pain only - 1 week for minor operations such as knee arthroscopy for meniscus tear resection, and 3 weeks for major operations such as knee repair/reconstruction surgeries.   6. Bandages: The physical therapist should change the bandages at the first post-op appointment. If needed, the dressing supplies have been provided to you. You may shower after this with waterproof bandaids covering the incisions.    7. Physical Therapy: 2 times per week for the first 4 weeks, then 1-2 times per week from weeks 4-8 post-op. Therapy typically starts on post operative Day 3 or 4. You have been provided an order for physical therapy. The therapist will provide home exercises.   8. Work: May return to full work when off of crutches. May do light duty/desk job in approximately 1-2 weeks when off of narcotics, pain is well-controlled, and swelling has decreased.   9. Post-Op Appointments: Your first post-op appointment will be with Dr. Allena KatzPatel in approximately 2 weeks time.    If you find that they have not been scheduled please call the Orthopaedic Appointment front desk at [phone].

## 2019-03-17 NOTE — Anesthesia Post-op Follow-up Note (Signed)
Anesthesia QCDR form completed.        

## 2019-03-17 NOTE — Op Note (Addendum)
Operative Note    SURGERY DATE:03/17/2019    PRE-OP DIAGNOSIS:  1. Right medial meniscus bucket handle tear   POST-OP DIAGNOSIS:  1. Right medial meniscus complex tear with bucket handle and horizontal component   PROCEDURES:  1. Right knee arthroscopy, medial meniscus repair (debridement of bucket-handle portion of tear, repair of horizontal tear of the posterior horn)   SURGEON: Rosealee Albee, MD       ASSISTANT: Sonny Dandy  ANESTHESIA: Gen   ESTIMATED BLOOD LOSS: minimal   TOTAL IV FLUIDS: per anesthesia   INDICATION(S): The patient is a 23 y.o. male with clinical exam and MRI that were consistent with a bucket handle medial meniscus tear. After discussion of risks, benefits, and alternatives to surgery, the patient elected to proceed.  The patient understands that there is a higher risk of re-tear with meniscus repair, but would prefer repair to maintain the normal biomechanics and structure of the knee. The patient is willing to perform the appropriate rehab and maintain weight-bearing restrictions post-operatively.   OPERATIVE FINDINGS:    Examination under anesthesia: A careful examination under anesthesia was performed.  Passive range of motion was: Hyperextension: 2.  Extension: 0.  Flexion: 120 (vs 135 on contralateral side).  Lachman: normal. Pivot Shift: normal.  Posterior drawer: normal.  Varus stability in full extension: normal.  Varus stability in 30 degrees of flexion: normal.  Valgus stability in full extension: normal.  Valgus stability in 30 degrees of flexion: normal.   Intra-operative findings: A thorough arthroscopic examination of the knee was performed.  The findings are: 1. Suprapatellar pouch: Normal 2. Undersurface of median ridge: Grade 1 softening 3. Medial patellar facet: Grade 1 softening 4. Lateral patellar facet: Grade 1 softening 5. Trochlea:  Normal 6. Lateral gutter/popliteus tendon: Normal 7. Hoffa's fat pad: Normal 8. Medial gutter/plica:  Normal 9. ACL: Normal 10. PCL: Normal 11. Medial meniscus:  Large bucket-handle tear of the medial meniscus involving the posterior horn and extending into the body.  Additionally there was a horizontal tear of the posterior horn extending into the posterior horn/body junction that went almost all the way to the meniscocapsular junction.  Bucket-handle piece of the medial meniscus had significant plastic deformation and torn edge had significant degeneration despite gentle debridement. 12. Medial compartment cartilage: Grade 1 degenerative changes to the tibial plateau and medial femoral condyle 13. Lateral meniscus: Normal 14. Lateral compartment cartilage: Normal lateral femoral condyle.  Small area near the tibial spine with grade 2 fissuring with surrounding grade 1 softening   OPERATIVE REPORT:     I identified the patient in the pre-operative holding area.  I marked the operative knee with my initials. I reviewed the risks and benefits of the proposed surgical intervention, and the patient wished to proceed. The patient was transferred to the operative suite and placed in the supine position with all bony prominences padded.  Anesthesia was administered. Appropriate IV antibiotics were administered within 30 minutes of incision. The extremity was then prepped and draped in standard fashion. A time out was performed confirming the correct extremity, correct patient, and correct procedure.   Arthroscopy portals were marked. Local anesthetic was injected to the planned portal sites. The anterolateral portal was established with an 11 blade. The arthroscope was placed in the anterolateral portal and then into the suprapatellar pouch.  Next the medial portal was established under needle localization. A spinal needle was used to pie-crust the MCL to allow for better visualization and protect the cartilage  surfaces during instrumentation. The bucket-handle portion of the medial meniscus was flipped  anteriorly and into the intercondylar notch.  This was reduced with a switching stick.  A diagnostic knee scope was completed with the above findings.    Given the plastic deformation of the torn portion of the medial meniscus bucket-handle fragment, involvement of less than 50% of the meniscus with, and significantly frayed edges of the posterior horn/body with horizontal component underlying it, the flipped portion of the bucket-handle tear was debrided.  This was performed using a combination of an arthroscopic biter and oscillating shaver.  The horizontal portion of the tear was better visualized and this extended from adjacent to the posterior horn meniscus root to the posterior horn/body junction.  This area as well as the capsule were roughened with a rasp and shaver to create a more optimal healing surface.  Ceterix Novostitch x3 all-inside sutures were passed in a hay bale fashion to reduce the horizontal meniscus tear.    To access the posterior horn/body junction, an additional  suture was passed in a similar fashion after switching portals.  A total of 4 stitches were used.  Afterwards, the meniscus was probed and felt to be stable. Arthroscopic fluid was removed from the joint.   The portals were closed with 3-0 Nylon suture. Sterile dressings included Xeroform, 4x4s, Sof-Rol, and Bias wrap. Tourniquet was released with time of 90 minutes. A Polarcare was placed. A hinged knee brace was applied.  The patient was then awakened and taken to the PACU hemodynamically stable without complication.    Of note, assistance from a PA was essential to performing the surgery. PA assisted with patient positioning, retraction, and instrumentation. The surgery would have been more difficult and had longer operative time without PA assistance.     POSTOPERATIVE PLAN: The patient will be discharged home today once they meet PACU criteria. Aspirin 325 mg daily was prescribed for 4 weeks for DVT prophylaxis.   Physical therapy will start on POD#3-4. 50%WB x 4 weeks. F/U in 2 weeks.

## 2019-03-18 NOTE — Anesthesia Postprocedure Evaluation (Signed)
Anesthesia Post Note  Patient: Wesley [name]  Procedure(s) Performed: KNEE ARTHROSCOPY WITH MEDIAL MENISECTOMY, POSSIBLE CHONDROPLASTY RIGHT - DIABETIC (Right Knee)  Patient location during evaluation: PACU Anesthesia Type: General Level of consciousness: awake and alert Pain management: pain level controlled Vital Signs Assessment: post-procedure vital signs reviewed and stable Respiratory status: spontaneous breathing, nonlabored ventilation and respiratory function stable Cardiovascular status: blood pressure returned to baseline and stable Postop Assessment: no apparent nausea or vomiting Anesthetic complications: no     Last Vitals:  Vitals:   03/17/19 1110 03/17/19 1158  BP: 111/61 119/77  Pulse: 77 79  Resp: 16 16  Temp: 36.4 C 36.6 C  SpO2: 97% 100%    Last Pain:  Vitals:   03/18/19 0826  TempSrc:   PainSc: 10-Worst pain ever                 Jovita Gamma

## 2019-03-21 ENCOUNTER — Encounter: Payer: Self-pay | Admitting: Orthopedic Surgery

## 2019-07-17 ENCOUNTER — Other Ambulatory Visit: Payer: Self-pay

## 2019-07-17 DIAGNOSIS — N492 Inflammatory disorders of scrotum: Secondary | ICD-10-CM | POA: Insufficient documentation

## 2019-07-17 DIAGNOSIS — Z833 Family history of diabetes mellitus: Secondary | ICD-10-CM | POA: Diagnosis not present

## 2019-07-17 DIAGNOSIS — L02214 Cutaneous abscess of groin: Secondary | ICD-10-CM | POA: Insufficient documentation

## 2019-07-17 DIAGNOSIS — Z87891 Personal history of nicotine dependence: Secondary | ICD-10-CM | POA: Insufficient documentation

## 2019-07-17 DIAGNOSIS — Z23 Encounter for immunization: Secondary | ICD-10-CM | POA: Insufficient documentation

## 2019-07-17 DIAGNOSIS — N5082 Scrotal pain: Secondary | ICD-10-CM | POA: Diagnosis not present

## 2019-07-17 DIAGNOSIS — Z20828 Contact with and (suspected) exposure to other viral communicable diseases: Secondary | ICD-10-CM | POA: Insufficient documentation

## 2019-07-17 DIAGNOSIS — Z794 Long term (current) use of insulin: Secondary | ICD-10-CM | POA: Insufficient documentation

## 2019-07-17 DIAGNOSIS — E109 Type 1 diabetes mellitus without complications: Secondary | ICD-10-CM | POA: Insufficient documentation

## 2019-07-17 LAB — CBC WITH DIFFERENTIAL/PLATELET
Abs Immature Granulocytes: 0.01 10*3/uL (ref 0.00–0.07)
Basophils Absolute: 0.1 10*3/uL (ref 0.0–0.1)
Basophils Relative: 1 %
Eosinophils Absolute: 0.3 10*3/uL (ref 0.0–0.5)
Eosinophils Relative: 3 %
HCT: 42.1 % (ref 39.0–52.0)
Hemoglobin: 13.8 g/dL (ref 13.0–17.0)
Immature Granulocytes: 0 %
Lymphocytes Relative: 30 %
Lymphs Abs: 2.3 10*3/uL (ref 0.7–4.0)
MCH: 29 pg (ref 26.0–34.0)
MCHC: 32.8 g/dL (ref 30.0–36.0)
MCV: 88.4 fL (ref 80.0–100.0)
Monocytes Absolute: 0.6 10*3/uL (ref 0.1–1.0)
Monocytes Relative: 7 %
Neutro Abs: 4.6 10*3/uL (ref 1.7–7.7)
Neutrophils Relative %: 59 %
Platelets: 255 10*3/uL (ref 150–400)
RBC: 4.76 MIL/uL (ref 4.22–5.81)
RDW: 12.9 % (ref 11.5–15.5)
WBC: 7.8 10*3/uL (ref 4.0–10.5)
nRBC: 0 % (ref 0.0–0.2)

## 2019-07-17 LAB — BASIC METABOLIC PANEL
Anion gap: 11 (ref 5–15)
BUN: 14 mg/dL (ref 6–20)
CO2: 28 mmol/L (ref 22–32)
Calcium: 9.4 mg/dL (ref 8.9–10.3)
Chloride: 100 mmol/L (ref 98–111)
Creatinine, Ser: 0.73 mg/dL (ref 0.61–1.24)
GFR calc Af Amer: >60 mL/min (ref >60)
GFR calc non Af Amer: >60 mL/min (ref >60)
Glucose, Bld: 163 mg/dL — ABNORMAL HIGH (ref 70–99)
Potassium: 3.8 mmol/L (ref 3.5–5.1)
Sodium: 139 mmol/L (ref 135–145)

## 2019-07-17 LAB — GLUCOSE, CAPILLARY: Glucose-Capillary: 143 mg/dL — ABNORMAL HIGH (ref 70–99)

## 2019-07-17 NOTE — ED Triage Notes (Addendum)
Pt has an abcess to the inner left leg at the groin that started painful yesterday. Pt has a prior admission for abcess.

## 2019-07-18 ENCOUNTER — Encounter
Admission: EM | Disposition: A | Discharge status: Home / Self Care | Payer: Self-pay | Source: Home / Self Care | Attending: Emergency Medicine

## 2019-07-18 ENCOUNTER — Inpatient Hospital Stay: Payer: BC Managed Care – PPO | Admitting: Anesthesiology

## 2019-07-18 ENCOUNTER — Encounter: Payer: Self-pay | Admitting: Radiology

## 2019-07-18 ENCOUNTER — Observation Stay
Admission: EM | Admit: 2019-07-18 | Discharge: 2019-07-19 | Disposition: A | Discharge status: Home / Self Care | Payer: BC Managed Care – PPO | Attending: Internal Medicine | Admitting: Internal Medicine

## 2019-07-18 ENCOUNTER — Emergency Department: Payer: BC Managed Care – PPO

## 2019-07-18 DIAGNOSIS — N492 Inflammatory disorders of scrotum: Secondary | ICD-10-CM | POA: Diagnosis present

## 2019-07-18 DIAGNOSIS — L02214 Cutaneous abscess of groin: Secondary | ICD-10-CM

## 2019-07-18 HISTORY — PX: INCISION AND DRAINAGE ABSCESS: SHX5864

## 2019-07-18 LAB — GLUCOSE, CAPILLARY
Glucose-Capillary: 83 mg/dL (ref 70–99)
Glucose-Capillary: 72 mg/dL (ref 70–99)
Glucose-Capillary: 71 mg/dL (ref 70–99)
Glucose-Capillary: 111 mg/dL — ABNORMAL HIGH (ref 70–99)
Glucose-Capillary: 153 mg/dL — ABNORMAL HIGH (ref 70–99)
Glucose-Capillary: 150 mg/dL — ABNORMAL HIGH (ref 70–99)
Glucose-Capillary: 109 mg/dL — ABNORMAL HIGH (ref 70–99)
Glucose-Capillary: 276 mg/dL — ABNORMAL HIGH (ref 70–99)

## 2019-07-18 LAB — URINE DRUG SCREEN, QUALITATIVE (ARMC ONLY)
Amphetamines, Ur Screen: NOT DETECTED
Barbiturates, Ur Screen: NOT DETECTED
Benzodiazepine, Ur Scrn: NOT DETECTED
Cannabinoid 50 Ng, Ur ~~LOC~~: POSITIVE — AB
Cocaine Metabolite,Ur ~~LOC~~: NOT DETECTED
MDMA (Ecstasy)Ur Screen: NOT DETECTED
Methadone Scn, Ur: NOT DETECTED
Opiate, Ur Screen: POSITIVE — AB
Phencyclidine (PCP) Ur S: NOT DETECTED
Tricyclic, Ur Screen: NOT DETECTED

## 2019-07-18 LAB — TSH: TSH: 3.655 u[IU]/mL (ref 0.350–4.500)

## 2019-07-18 LAB — SARS CORONAVIRUS 2 BY RT PCR (HOSPITAL ORDER, PERFORMED IN ~~LOC~~ HOSPITAL LAB): SARS Coronavirus 2: NEGATIVE

## 2019-07-18 LAB — HIV ANTIBODY (ROUTINE TESTING W REFLEX): HIV Screen 4th Generation wRfx: NONREACTIVE

## 2019-07-18 LAB — HEMOGLOBIN A1C
Hgb A1c MFr Bld: 10.9 % — ABNORMAL HIGH (ref 4.8–5.6)
Mean Plasma Glucose: 266.13 mg/dL

## 2019-07-18 LAB — MRSA PCR SCREENING: MRSA by PCR: NEGATIVE

## 2019-07-18 SURGERY — INCISION AND DRAINAGE, ABSCESS
Anesthesia: General

## 2019-07-18 MED ORDER — FENTANYL CITRATE (PF) 100 MCG/2ML IJ SOLN
INTRAMUSCULAR | Status: AC
  Filled 2019-07-18: qty 2

## 2019-07-18 MED ORDER — HYDROMORPHONE HCL 1 MG/ML IJ SOLN
0.2500 mg | INTRAMUSCULAR | Status: DC | PRN
  Administered 2019-07-18: 0.25 mg via INTRAVENOUS

## 2019-07-18 MED ORDER — CHLORHEXIDINE GLUCONATE CLOTH 2 % EX PADS
6.0000 | MEDICATED_PAD | Freq: Once | CUTANEOUS | Status: AC
  Administered 2019-07-18: 6 via TOPICAL

## 2019-07-18 MED ORDER — SODIUM CHLORIDE 0.9 % IV BOLUS
1000.0000 mL | Freq: Once | INTRAVENOUS | Status: AC
  Administered 2019-07-18: 1000 mL via INTRAVENOUS

## 2019-07-18 MED ORDER — DOCUSATE SODIUM 100 MG PO CAPS
100.0000 mg | ORAL_CAPSULE | Freq: Two times a day (BID) | ORAL | Status: DC
  Administered 2019-07-18 – 2019-07-19 (×2): 100 mg via ORAL
  Filled 2019-07-18 (×2): qty 1

## 2019-07-18 MED ORDER — POTASSIUM CHLORIDE IN NACL 20-0.9 MEQ/L-% IV SOLN
INTRAVENOUS | Status: DC
  Administered 2019-07-18: 06:00:00 via INTRAVENOUS
  Filled 2019-07-18 (×3): qty 1000

## 2019-07-18 MED ORDER — KETOROLAC TROMETHAMINE 30 MG/ML IJ SOLN
INTRAMUSCULAR | Status: AC
  Filled 2019-07-18: qty 1

## 2019-07-18 MED ORDER — MIDAZOLAM HCL 2 MG/2ML IJ SOLN
INTRAMUSCULAR | Status: AC
  Filled 2019-07-18: qty 2

## 2019-07-18 MED ORDER — IOHEXOL 300 MG/ML  SOLN
100.0000 mL | Freq: Once | INTRAMUSCULAR | Status: AC | PRN
  Administered 2019-07-18: 100 mL via INTRAVENOUS

## 2019-07-18 MED ORDER — LIDOCAINE HCL (PF) 1 % IJ SOLN
INTRAMUSCULAR | Status: AC
  Filled 2019-07-18: qty 30

## 2019-07-18 MED ORDER — ACETAMINOPHEN 325 MG PO TABS: 650.0000 mg | ORAL_TABLET | Freq: Four times a day (QID) | ORAL | Status: DC | PRN

## 2019-07-18 MED ORDER — INFLUENZA VAC SPLIT QUAD 0.5 ML IM SUSY
0.5000 mL | PREFILLED_SYRINGE | INTRAMUSCULAR | Status: AC
  Administered 2019-07-19: 0.5 mL via INTRAMUSCULAR
  Filled 2019-07-18: qty 0.5

## 2019-07-18 MED ORDER — KETOROLAC TROMETHAMINE 30 MG/ML IJ SOLN
INTRAMUSCULAR | Status: DC | PRN
  Administered 2019-07-18: 30 mg via INTRAVENOUS

## 2019-07-18 MED ORDER — PROPOFOL 10 MG/ML IV BOLUS
INTRAVENOUS | Status: AC
  Filled 2019-07-18: qty 20

## 2019-07-18 MED ORDER — ONDANSETRON HCL 4 MG/2ML IJ SOLN: 4.0000 mg | Freq: Four times a day (QID) | INTRAMUSCULAR | Status: DC | PRN

## 2019-07-18 MED ORDER — ACETAMINOPHEN NICU IV SYRINGE 10 MG/ML
INTRAVENOUS | Status: AC
  Filled 2019-07-18: qty 1

## 2019-07-18 MED ORDER — INSULIN GLARGINE 100 UNIT/ML ~~LOC~~ SOLN
10.0000 [IU] | Freq: Every day | SUBCUTANEOUS | Status: DC
  Administered 2019-07-18: 10 [IU] via SUBCUTANEOUS
  Filled 2019-07-18 (×2): qty 0.1

## 2019-07-18 MED ORDER — BUPIVACAINE HCL (PF) 0.5 % IJ SOLN
INTRAMUSCULAR | Status: AC
  Filled 2019-07-18: qty 30

## 2019-07-18 MED ORDER — INSULIN GLARGINE 100 UNIT/ML ~~LOC~~ SOLN
10.0000 [IU] | Freq: Every day | SUBCUTANEOUS | Status: DC
  Filled 2019-07-18: qty 0.1

## 2019-07-18 MED ORDER — MORPHINE SULFATE (PF) 4 MG/ML IV SOLN
4.0000 mg | INTRAVENOUS | Status: DC | PRN
  Administered 2019-07-18 – 2019-07-19 (×2): 4 mg via INTRAVENOUS
  Filled 2019-07-18 (×2): qty 1

## 2019-07-18 MED ORDER — MIDAZOLAM HCL 2 MG/2ML IJ SOLN
INTRAMUSCULAR | Status: DC | PRN
  Administered 2019-07-18: 2 mg via INTRAVENOUS

## 2019-07-18 MED ORDER — ONDANSETRON HCL 4 MG/2ML IJ SOLN
4.0000 mg | INTRAMUSCULAR | Status: AC
  Administered 2019-07-18: 4 mg via INTRAVENOUS
  Filled 2019-07-18: qty 2

## 2019-07-18 MED ORDER — LIDOCAINE HCL (PF) 2 % IJ SOLN
INTRAMUSCULAR | Status: AC
  Filled 2019-07-18: qty 10

## 2019-07-18 MED ORDER — ACETAMINOPHEN 650 MG RE SUPP: 650.0000 mg | Freq: Four times a day (QID) | RECTAL | Status: DC | PRN

## 2019-07-18 MED ORDER — DEXTROSE-NACL 5-0.9 % IV SOLN
INTRAVENOUS | Status: DC
  Administered 2019-07-18: 09:00:00 via INTRAVENOUS

## 2019-07-18 MED ORDER — ACETAMINOPHEN 10 MG/ML IV SOLN
INTRAVENOUS | Status: DC | PRN
  Administered 2019-07-18: 1000 mg via INTRAVENOUS

## 2019-07-18 MED ORDER — ONDANSETRON HCL 4 MG PO TABS: 4.0000 mg | ORAL_TABLET | Freq: Four times a day (QID) | ORAL | Status: DC | PRN

## 2019-07-18 MED ORDER — VANCOMYCIN HCL 10 G IV SOLR
2000.0000 mg | Freq: Once | INTRAVENOUS | Status: AC
  Administered 2019-07-18: 02:00:00 2000 mg via INTRAVENOUS
  Filled 2019-07-18: qty 2000

## 2019-07-18 MED ORDER — OXYCODONE-ACETAMINOPHEN 5-325 MG PO TABS
1.0000 | ORAL_TABLET | Freq: Four times a day (QID) | ORAL | Status: DC | PRN
  Administered 2019-07-18 – 2019-07-19 (×2): 1 via ORAL
  Filled 2019-07-18 (×2): qty 1

## 2019-07-18 MED ORDER — MORPHINE SULFATE (PF) 4 MG/ML IV SOLN
4.0000 mg | Freq: Once | INTRAVENOUS | Status: AC
  Administered 2019-07-18: 4 mg via INTRAVENOUS
  Filled 2019-07-18: qty 1

## 2019-07-18 MED ORDER — FENTANYL CITRATE (PF) 100 MCG/2ML IJ SOLN
INTRAMUSCULAR | Status: DC | PRN
  Administered 2019-07-18 (×3): 50 ug via INTRAVENOUS

## 2019-07-18 MED ORDER — VANCOMYCIN HCL 10 G IV SOLR
1750.0000 mg | Freq: Two times a day (BID) | INTRAVENOUS | Status: DC
  Administered 2019-07-18 – 2019-07-19 (×2): 1750 mg via INTRAVENOUS
  Filled 2019-07-18 (×3): qty 1750

## 2019-07-18 MED ORDER — BUPIVACAINE HCL 0.5 % IJ SOLN
INTRAMUSCULAR | Status: DC | PRN
  Administered 2019-07-18: 4 mL

## 2019-07-18 MED ORDER — SODIUM CHLORIDE 0.9 % IV SOLN
INTRAVENOUS | Status: DC | PRN
  Administered 2019-07-18: 13:00:00 via INTRAVENOUS

## 2019-07-18 MED ORDER — INSULIN ASPART 100 UNIT/ML ~~LOC~~ SOLN
0.0000 [IU] | SUBCUTANEOUS | Status: DC
  Administered 2019-07-18: 5 [IU] via SUBCUTANEOUS
  Administered 2019-07-19 (×2): 2 [IU] via SUBCUTANEOUS
  Filled 2019-07-18 (×4): qty 1

## 2019-07-18 MED ORDER — HYDROMORPHONE HCL 1 MG/ML IJ SOLN
INTRAMUSCULAR | Status: AC
  Administered 2019-07-18: 14:00:00
  Filled 2019-07-18: qty 1

## 2019-07-18 MED ORDER — ONDANSETRON HCL 4 MG/2ML IJ SOLN
INTRAMUSCULAR | Status: AC
  Filled 2019-07-18: qty 2

## 2019-07-18 MED ORDER — PIPERACILLIN-TAZOBACTAM 3.375 G IVPB 30 MIN
3.3750 g | Freq: Once | INTRAVENOUS | Status: AC
  Administered 2019-07-18: 3.375 g via INTRAVENOUS
  Filled 2019-07-18: qty 50

## 2019-07-18 MED ORDER — ONDANSETRON HCL 4 MG/2ML IJ SOLN
INTRAMUSCULAR | Status: DC | PRN
  Administered 2019-07-18: 4 mg via INTRAVENOUS

## 2019-07-18 SURGICAL SUPPLY — 36 items
BNDG CONFORM 2 STRL LF (GAUZE/BANDAGES/DRESSINGS) ×1 IMPLANT
BRIEF STRETCH MATERNITY 2XLG (MISCELLANEOUS) ×2 IMPLANT
CANISTER SUCT 1200ML W/VALVE (MISCELLANEOUS) ×3 IMPLANT
CHLORAPREP W/TINT 26 (MISCELLANEOUS) ×1 IMPLANT
COVER WAND RF STERILE (DRAPES) ×1 IMPLANT
DERMABOND ADVANCED (GAUZE/BANDAGES/DRESSINGS)
DERMABOND ADVANCED .7 DNX12 (GAUZE/BANDAGES/DRESSINGS) ×1 IMPLANT
DRAIN PENROSE 1/4X12 LTX STRL (WOUND CARE) ×3 IMPLANT
DRAPE LAPAROTOMY 77X122 PED (DRAPES) ×3 IMPLANT
DRSG GAUZE FLUFF 36X18 (GAUZE/BANDAGES/DRESSINGS) ×3 IMPLANT
ELECT REM PT RETURN 9FT ADLT (ELECTROSURGICAL) ×3
ELECTRODE REM PT RTRN 9FT ADLT (ELECTROSURGICAL) ×1 IMPLANT
GAUZE PACKING IODOFORM 1/2 (PACKING) ×2 IMPLANT
GAUZE SPONGE 4X4 12PLY STRL (GAUZE/BANDAGES/DRESSINGS) ×1 IMPLANT
GLOVE BIO SURGEON STRL SZ8 (GLOVE) ×3 IMPLANT
GOWN STRL REUS W/ TWL LRG LVL3 (GOWN DISPOSABLE) ×2 IMPLANT
GOWN STRL REUS W/ TWL XL LVL3 (GOWN DISPOSABLE) ×1 IMPLANT
GOWN STRL REUS W/TWL LRG LVL3 (GOWN DISPOSABLE) ×4
GOWN STRL REUS W/TWL XL LVL3 (GOWN DISPOSABLE) ×2
KIT TURNOVER KIT A (KITS) ×3 IMPLANT
LABEL OR SOLS (LABEL) ×3 IMPLANT
NDL HYPO 25X1 1.5 SAFETY (NEEDLE) ×1 IMPLANT
NEEDLE HYPO 25X1 1.5 SAFETY (NEEDLE) ×3 IMPLANT
NS IRRIG 500ML POUR BTL (IV SOLUTION) ×3 IMPLANT
PACK BASIN MINOR ARMC (MISCELLANEOUS) ×3 IMPLANT
SOL PREP PVP 2OZ (MISCELLANEOUS) ×3
SOLUTION PREP PVP 2OZ (MISCELLANEOUS) ×1 IMPLANT
SUPPORETR ATHLETIC LG (MISCELLANEOUS) ×1 IMPLANT
SUPPORTER ATHLETIC LG (MISCELLANEOUS)
SUT ETHILON 3-0 FS-10 30 BLK (SUTURE)
SUT VIC AB 3-0 SH 27 (SUTURE)
SUT VIC AB 3-0 SH 27X BRD (SUTURE) ×2 IMPLANT
SUT VIC AB 4-0 SH 27 (SUTURE)
SUT VIC AB 4-0 SH 27XANBCTRL (SUTURE) ×1 IMPLANT
SUTURE EHLN 3-0 FS-10 30 BLK (SUTURE) ×1 IMPLANT
SYR 10ML LL (SYRINGE) ×3 IMPLANT

## 2019-07-18 NOTE — Anesthesia Procedure Notes (Signed)
Procedure Name: LMA Insertion Performed by: Jayesh Marbach, CRNA Pre-anesthesia Checklist: Patient identified, Patient being monitored, Timeout performed, Emergency Drugs available and Suction available Patient Re-evaluated:Patient Re-evaluated prior to induction Oxygen Delivery Method: Circle system utilized Preoxygenation: Pre-oxygenation with 100% oxygen Induction Type: IV induction Ventilation: Mask ventilation without difficulty LMA: LMA inserted LMA Size: 4.5 Tube type: Oral Number of attempts: 1 Placement Confirmation: positive ETCO2 and breath sounds checked- equal and bilateral Tube secured with: Tape Dental Injury: Teeth and Oropharynx as per pre-operative assessment        

## 2019-07-18 NOTE — Transfer of Care (Signed)
Immediate Anesthesia Transfer of Care Note  Patient: Wesley [name]  Procedure(s) Performed: INCISION AND DRAINAGE SCROTAL ABSCESS (N/A )  Patient Location: PACU  Anesthesia Type:General  Level of Consciousness: drowsy and patient cooperative  Airway & Oxygen Therapy: Patient Spontanous Breathing and Patient connected to face mask oxygen  Post-op Assessment: Report given to RN and Post -op Vital signs reviewed and stable  Post vital signs: Reviewed and stable  Last Vitals:  Vitals Value Taken Time  BP 131/61 07/18/19 1311  Temp 36.2 C 07/18/19 1311  Pulse 72 07/18/19 1324  Resp 12 07/18/19 1324  SpO2 100 % 07/18/19 1324  Vitals shown include unvalidated device data.  Last Pain:  Vitals:   07/18/19 1311  TempSrc:   PainSc: Asleep      Patients Stated Pain Goal: 0 (80/03/49 1791)  Complications: No apparent anesthesia complications

## 2019-07-18 NOTE — Progress Notes (Addendum)
Per verbal from Dr. Bernardo Heater, nothing needed at this time in regards to packing which was placed during surgery. Per MD, if bleeding, reinforce with fluffs.  Awaiting orders before returning pt to room 230.

## 2019-07-18 NOTE — Interval H&P Note (Signed)
History and Physical Interval Note:  07/18/2019 12:08 PM  Wesley [name]  has presented today for surgery, with the diagnosis of scrotal abscess.  The various methods of treatment have been discussed with the patient and family. After consideration of risks, benefits and other options for treatment, the patient has consented to  Procedure(s): INCISION AND DRAINAGE SCROTAL ABSCESS (N/A) as a surgical intervention.  The patient's history has been reviewed, patient examined, no change in status, stable for surgery.  I have reviewed the patient's chart and labs.  Questions were answered to the patient's satisfaction.     [name]

## 2019-07-18 NOTE — H&P (View-Only) (Signed)
   Urology Consult  I have been asked to see the patient by Dr. Forbach, for evaluation and management of left inguinal abscess.  Chief Complaint: Left inguinal tenderness, enlarging abscess  History of Present Illness: Wesley [name] is a 23 y.o. year old male who presented to the ED yesterday with complaints of an enlarging area of swelling and tenderness on the left side of his groin.  PMH significant for DM1 with history of DKA in the past scrotal cellulitis with abscess requiring I&D approximately 3 to 4 years ago.  CT pelvis with contrast revealed a loculated collection of fluid extending from the left medial perineum into the inguinal canal and scrotal sac without subcutaneous emphysema.  He was admitted for management of left inguinal abscess with extension into the left scrotum.  Patient reports first noticing swelling and tenderness of the left inguinal crease 1 week ago.  He states this area has become increasingly tender and enlarged since.  He denies drainage from the site, fevers, and chills.  He does report occasional use of Nair hair removal products that cause him problematic "shave bumps".  He reports his last scrotal abscess I&D approximately 3 to 4 years ago was left open, requiring packing.   He is afebrile, vitals this morning within normal limits.  WBC count 7.8.  He has been n.p.o. since last night.  Broad spectrum antibiotics as below.  Anti-infectives (From admission, onward)   Start     Dose/Rate Route Frequency Ordered Stop   07/18/19 1500  vancomycin (VANCOCIN) 1,750 mg in sodium chloride 0.9 % 500 mL IVPB     1,750 mg 250 mL/hr over 120 Minutes Intravenous Every 12 hours 07/18/19 0315     07/18/19 0230  vancomycin (VANCOCIN) 2,000 mg in sodium chloride 0.9 % 500 mL IVPB     2,000 mg 250 mL/hr over 120 Minutes Intravenous  Once 07/18/19 0145 07/18/19 0611   07/18/19 0145  piperacillin-tazobactam (ZOSYN) IVPB 3.375 g     3.375 g 100 mL/hr over 30 Minutes  Intravenous  Once 07/18/19 0143 07/18/19 0215      Past Medical History:  Diagnosis Date  . Diabetes mellitus without complication (HCC)    type 1 diabetes    Past Surgical History:  Procedure Laterality Date  . HAND SURGERY    . INCISION AND DRAINAGE ABSCESS N/A 10/02/2015   Procedure: INCISION AND DRAINAGE ABSCESS;  Surgeon: Matthew Eskridge, MD;  Location: [facility] ORS;  Service: Urology;  Laterality: N/A;  . KNEE ARTHROSCOPY WITH MEDIAL MENISECTOMY Right 03/17/2019   Procedure: KNEE ARTHROSCOPY WITH MEDIAL MENISECTOMY, POSSIBLE CHONDROPLASTY RIGHT - DIABETIC;  Surgeon: Patel, Sunny, MD;  Location: [facility] ORS;  Service: Orthopedics;  Laterality: Right;  . TONSILLECTOMY    . TONSILLECTOMY AND ADENOIDECTOMY    . UMBILICAL HERNIA REPAIR      Home Medications:  Current Meds  Medication Sig  . insulin NPH-regular Human (70-30) 100 UNIT/ML injection Inject 40 Units into the skin every morning.  . insulin regular (NOVOLIN R) 100 units/mL injection Inject 1-10 Units into the skin 3 (three) times daily before meals. Per sliding scale    Allergies: No Known Allergies  Family History  Problem Relation Age of Onset  . Diabetes Mellitus II Paternal Grandfather   . Hypertension Mother     Social History:  reports that he has quit smoking. His smoking use included cigarettes. He has a 2.00 pack-year smoking history. He has never used smokeless tobacco. He reports current alcohol use. He   reports current drug use. Drug: Marijuana.  ROS: A complete review of systems was performed.  All systems are negative except for pertinent findings as noted.  Physical Exam:  Vital signs in last 24 hours: Temp:  [97.6 F (36.4 C)-98.2 F (36.8 C)] 97.6 F (36.4 C) (10/05 0438) Pulse Rate:  [88-116] 88 (10/05 0438) Resp:  [18-20] 20 (10/05 0438) BP: (120-141)/(69-81) 120/69 (10/05 0438) SpO2:  [99 %-100 %] 100 % (10/05 0438) Weight:  [78.8 kg-79.4 kg] 78.8 kg (10/05 0438) Constitutional:  Alert and  oriented, no acute distress HEENT: [place] AT, moist mucus membranes Cardiovascular: No clubbing, cyanosis, or edema. Respiratory: Normal respiratory effort, lungs clear bilaterally GU: Region of tender fluctuance approximately 4 cm in length on the left inguinal crease adjacent to the perineum with extension into the left scrotum.  No visualized drainage or open wounds associated with this. Neurologic: Grossly intact, no focal deficits, moving all 4 extremities Psychiatric: Normal mood and affect   Laboratory Data:  Recent Labs    07/17/19 2214  WBC 7.8  HGB 13.8  HCT 42.1   Recent Labs    07/17/19 2214  NA 139  K 3.8  CL 100  CO2 28  GLUCOSE 163*  BUN 14  CREATININE 0.73  CALCIUM 9.4    Results for orders placed or performed during the hospital encounter of 07/18/19  SARS Coronavirus 2 [facility] order, Performed in [facility] hospital lab) Nasopharyngeal Nasopharyngeal Swab     Status: None   Collection Time: 07/18/19  2:10 AM   Specimen: Nasopharyngeal Swab  Result Value Ref Range Status   SARS Coronavirus 2 NEGATIVE NEGATIVE Final    Comment: (NOTE) If result is NEGATIVE SARS-CoV-2 target nucleic acids are NOT DETECTED. The SARS-CoV-2 RNA is generally detectable in upper and lower  respiratory specimens during the acute phase of infection. The lowest  concentration of SARS-CoV-2 viral copies this assay can detect is 250  copies / mL. A negative result does not preclude SARS-CoV-2 infection  and should not be used as the sole basis for treatment or other  patient management decisions.  A negative result may occur with  improper specimen collection / handling, submission of specimen other  than nasopharyngeal swab, presence of viral mutation(s) within the  areas targeted by this assay, and inadequate number of viral copies  (<250 copies / mL). A negative result must be combined with clinical  observations, patient history, and epidemiological information. If result is  POSITIVE SARS-CoV-2 target nucleic acids are DETECTED. The SARS-CoV-2 RNA is generally detectable in upper and lower  respiratory specimens dur ing the acute phase of infection.  Positive  results are indicative of active infection with SARS-CoV-2.  Clinical  correlation with patient history and other diagnostic information is  necessary to determine patient infection status.  Positive results do  not rule out bacterial infection or co-infection with other viruses. If result is PRESUMPTIVE POSTIVE SARS-CoV-2 nucleic acids MAY BE PRESENT.   A presumptive positive result was obtained on the submitted specimen  and confirmed on repeat testing.  While 2019 novel coronavirus  (SARS-CoV-2) nucleic acids may be present in the submitted sample  additional confirmatory testing may be necessary for epidemiological  and / or clinical management purposes  to differentiate between  SARS-CoV-2 and other Sarbecovirus currently known to infect humans.  If clinically indicated additional testing with an alternate test  methodology [phone]) is advised. The SARS-CoV-2 RNA is generally  detectable in upper and lower respiratory sp ecimens  during the acute  phase of infection. The expected result is Negative. Fact Sheet for Patients:  BoilerBrush.com.cy Fact Sheet for Healthcare Providers: https://pope.com/ This test is not yet approved or cleared by the [place] FDA and has been authorized for detection and/or diagnosis of SARS-CoV-2 by FDA under an Emergency Use Authorization (EUA).  This EUA will remain in effect (meaning this test can be used) for the duration of the COVID-19 declaration under Section 564(b)(1) of the Act, 21 U.S.C. section 360bbb-3(b)(1), unless the authorization is terminated or revoked sooner. Performed at [facility], [address] [place] [place], [place] [address]     Radiologic Imaging: Ct Pelvis W Contrast   Addendum Date: 07/18/2019   ADDENDUM REPORT: 07/18/2019 02:34 ADDENDUM: No subcutaneous emphysema seen within the soft tissues surrounding the perineum. These results were discussed by telephone at the time of interpretation on 07/18/2019 at 2:33 am to provider [facility] , who verbally acknowledged these results. Electronically Signed   By: Jonna Clark M.D.   On: 07/18/2019 02:34   Result Date: 07/18/2019 CLINICAL DATA:  Scrotal mass or lung, abscess EXAM: CT PELVIS WITH CONTRAST TECHNIQUE: Multidetector CT imaging of the pelvis was performed using the standard protocol following the bolus administration of intravenous contrast. CONTRAST:  OMNIPAQUE IOHEXOL 300 MG/ML  SOLN COMPARISON:  None. FINDINGS: Urinary Tract: The visualized distal ureters and bladder appear unremarkable. Bowel: No bowel wall thickening, distention or surrounding inflammation identified within the pelvis. Vascular/Lymphatic: No enlarged pelvic lymph nodes identified. No significant vascular findings. Reproductive: Within the left medial perineal soft tissues there is a peripherally enhancing loculated fluid collection which extends into the left inguinal canal along the scrotal sac. The testicle however appears to be intact. Other: Skin thickening with subcutaneous edema seen along the medial left perineal soft tissues. Musculoskeletal: No acute or significant osseous findings. IMPRESSION: Enhancing loculated fluid collection extending from the left medial perineal soft tissues into the inguinal canal and scrotal sac, consistent with abscess. Electronically Signed: By: Jonna Clark M.D. On: 07/18/2019 02:20   I personally reviewed the imaging above and note the presence of a left inguinal abscess with extension into the scrotum.  Assessment & Plan:  Patient with a 1 week history of left inguinal abscess extending into the left scrotum.  No evidence of Fournier's gangrene.  Patient is afebrile with a normal white cell count.  I  do not suspect systemic infection at this time.  We will plan to proceed for OR I&D of the abscess late this morning with plans for intraoperative culture collection to target antibiotic therapy.  I counseled the patient that his wound would likely remain open, requiring packing.  He is familiar with this, as he is had similar wound closures in the past.  Patient is n.p.o. and can proceed to the OR at this time.  Thank you for involving me in this patient's care, I will continue to follow along.  Carman Ching, PA-C 07/18/2019 8:24 AM

## 2019-07-18 NOTE — Progress Notes (Signed)
Patient admitted early this morning.  Seen and examined by me later in the morning.  Please see H&P for further details.  Patient states that he is feeling a little bit better than when he first got to the emergency room.  On exam, he is tired appearing, but nontoxic.  RRR, lungs clear to auscultation bilaterally.  Abscess of the left inguinal region with scrotal cellulitis -Urology consulted-plan for I&D today -Continue broad-spectrum antibiotics -Follow-up surgical wound culture -Continue IV fluids -NPO for now, patient may eat when he returns from surgery  Uncontrolled type 1 diabetes- A1c 10.7% this admission -Continue Lantus and SSI  Hyman Bible, MD

## 2019-07-18 NOTE — Op Note (Signed)
Preoperative diagnosis:  1. Abscess left hemiscrotum/groin  Postoperative diagnosis:  1. Same  Procedure: 1. Incision and drainage  Surgeon: Abbie Sons, MD  Anesthesia: General  Complications: None  Intraoperative findings:  Abscess cavity just lateral to margin of inferior left hemiscrotum extending into the lower scrotum.  Induration of perineum.  No loculations or abscess.  EBL: Minimal  Specimens: None  Indication: Wesley [name] is a 23 y.o. patient with a recurrent abscess of the left hemiscrotum/groin.  After reviewing the management options for treatment, he elected to proceed with the above surgical procedure(s). We have discussed the potential benefits and risks of the procedure, side effects of the proposed treatment, the likelihood of the patient achieving the goals of the procedure, and any potential problems that might occur during the procedure or recuperation. Informed consent has been obtained.  Description of procedure:  The patient was taken to the operating room and general anesthesia was induced.  The patient was placed in the dorsal lithotomy position, prepped and draped in the usual sterile fashion.  Antibiotics have been received on the floor.  A preoperative time-out was performed.   Examination under anesthesia was carried out.  The greatest area of fluctuance was lateral to the left hemiscrotal margin.  The fluctuance extended inferiorly and induration of the perineum was noted.  An approximately 4 cm incision was made over the fluctuant area curving medially to the proximal perineum.  Approximately 10 mL of thick, purulent fluid was drained.  The cavity was swabbed for aerobic/anaerobic cultures.  A finger was inserted in the abscess cavity.  It extended into the lower left hemiscrotum.  Induration was noted distal to the incision but then loculations or abscess cavity was noted.  Hemostasis of the skin edges were obtained with cautery.  The abscess  cavity was copiously irrigated with sterile saline.  A 0.5 inch iodoform packing was placed.  The site was dressed with fluffs and mesh underwear.  After anesthetic reversal he was transported to the PACU in stable condition.  Plan: He has been admitted to the hospital service for IV antibiotics.  Begin dressing changes tomorrow.   Abbie Sons, M.D.

## 2019-07-18 NOTE — Progress Notes (Signed)
Current CBG 72. Patient is asymptomatic. Per MD recheck in an hour.   Fuller Mandril, RN

## 2019-07-18 NOTE — Progress Notes (Signed)
Inpatient Diabetes Program Recommendations  AACE/ADA: New Consensus Statement on Inpatient Glycemic Control (2015)  Target Ranges:  Prepandial:   less than 140 mg/dL      Peak postprandial:   less than 180 mg/dL (1-2 hours)      Critically ill patients:  140 - 180 mg/dL   Results for Wesley [name], Wesley [name] (MRN [id number]) as of 07/18/2019 10:55  Ref. Range 07/17/2019 22:19 07/18/2019 04:56 07/18/2019 07:36 07/18/2019 08:38  Glucose-Capillary Latest Ref Range: 70 - 99 mg/dL 143 (H) 83 72  10 units LANTUS given at 6:45am 71   Results for Wesley [name], Wesley [name] (MRN [id number]) as of 07/18/2019 10:55  Ref. Range 10/17/2018 20:22 07/18/2019 05:47  Hemoglobin A1C Latest Ref Range: 4.8 - 5.6 % 12.3 (H) 10.9 (H)  (266 mg/dl)    Admit with: Left inguinal abscess with extension into the perineum and scrotum   History: Type 1 Diabetes  Home DM Meds: 70/30 Insulin 40 units AM       Regular Insulin 1-10 units TID per SSI  Current Orders: Lantus 10 units QHS     Novolog Sensitive Correction Scale/ SSI (0-9 units) Q4 hours     NPO for I&D today.  Current A1c= 10.9%--Somewhat down from January, however, still well above goal of 7%.  [facility]P: [facility]--Last seen??      --Will follow patient during hospitalization--  Wyn Quaker RN, MSN, CDE Diabetes Coordinator Inpatient Glycemic Control Team Team Pager: [phone] (8a-5p)

## 2019-07-18 NOTE — ED Notes (Addendum)
Pt sleeping prior to iv pump alarming. resps unlabored, pt denies needs.

## 2019-07-18 NOTE — ED Provider Notes (Signed)
[facility] Emergency Department Provider Note  ____________________________________________   First MD Initiated Contact with Patient 07/18/19 [phone]     (approximate)  I have reviewed the triage vital signs and the nursing notes.   HISTORY  Chief Complaint abcess    HPI Wesley [name] is a 23 y.o. male with a history of type 1 diabetes and prior episodes of DKA who also about 3 to 4 years ago had scrotal cellulitis with a scrotal abscess requiring urological I&D in the operating room.  He presents tonight for rapidly worsening pain and swelling in the left side of his groin that is extending into the scrotum.  He says it is in the same place and feels similar to the episode he had a few years ago but actually feels worse and is larger.  He says that before he did not have the firmness and swelling and pain within the scrotum that he is having this time.  He denies fever/chills, sore throat, chest pain or shortness of breath, nausea, vomiting, diarrhea, and abdominal pain.  He is not having any burning when he pees.  He has had no trauma to the area.  He describes his symptoms as severe and rapidly and acutely getting worse.  He has not been around anyone with COVID-19.         Past Medical History:  Diagnosis Date   Diabetes mellitus without complication ([place])    type 1 diabetes    Patient Active Problem List   Diagnosis Date Noted   Acute renal failure ([place])    DKA (diabetic ketoacidoses) ([place]) 09/01/2016   Sepsis ([place]) 10/02/2015   Scrotal abscess    Diabetes mellitus with ketoacidosis ([place]) 08/13/2012   Type 1 diabetes mellitus ([place]) 02/06/2012    Past Surgical History:  Procedure Laterality Date   HAND SURGERY     INCISION AND DRAINAGE ABSCESS N/A 10/02/2015   Procedure: INCISION AND DRAINAGE ABSCESS;  Surgeon: Festus Aloe, MD;  Location: [facility] ORS;  Service: Urology;  Laterality: N/A;   KNEE ARTHROSCOPY WITH MEDIAL MENISECTOMY  Right 03/17/2019   Procedure: KNEE ARTHROSCOPY WITH MEDIAL MENISECTOMY, POSSIBLE CHONDROPLASTY RIGHT - DIABETIC;  Surgeon: Leim Fabry, MD;  Location: [facility] ORS;  Service: Orthopedics;  Laterality: Right;   TONSILLECTOMY     TONSILLECTOMY AND ADENOIDECTOMY     UMBILICAL HERNIA REPAIR      Prior to Admission medications   Medication Sig Start Date End Date Taking? Authorizing Provider  acetaminophen (TYLENOL) 500 MG tablet Take 2 tablets (1,000 mg total) by mouth every 8 (eight) hours. 03/17/19 03/16/20  Leim Fabry, MD  blood glucose meter kit and supplies KIT Dispense based on patient and insurance preference. Use up to four times daily as directed. (FOR ICD-9 250.00, 250.01). 03/21/17   Gladstone Lighter, MD  HYDROcodone-acetaminophen (NORCO) 5-325 MG tablet Take 1-2 tablets by mouth every 4 (four) hours as needed for moderate pain or severe pain. Patient not taking: Reported on 07/18/2019 03/17/19   Leim Fabry, MD  insulin NPH-regular Human (70-30) 100 UNIT/ML injection Inject 40 Units into the skin every morning.    [provider]  insulin regular (NOVOLIN R) 100 units/mL injection Inject 1-10 Units into the skin 3 (three) times daily before meals.    [provider]  ondansetron (ZOFRAN ODT) 4 MG disintegrating tablet Take 1 tablet (4 mg total) by mouth every 8 (eight) hours as needed for nausea or vomiting. Patient not taking: Reported on 07/18/2019 03/17/19   Posey Pronto,  Tarry Kos, MD    Allergies Patient has no known allergies.  Family History  Problem Relation Age of Onset   Diabetes Mellitus II Paternal Grandfather    Hypertension Mother     Social History Social History   Tobacco Use   Smoking status: Former Smoker    Packs/day: 0.50    Years: 4.00    Pack years: 2.00    Types: Cigarettes   Smokeless tobacco: Never Used  Substance Use Topics   Alcohol use: Yes    Comment: occasional   Drug use: Yes    Types: Marijuana    Comment: PT DENIES THIS DURING  PREOP INTERVIEW ON 6-1 BUT IN DR PATELS H&P IT STATES HE DOES MARIJUANA    Review of Systems Constitutional: No fever/chills Eyes: No visual changes. ENT: No sore throat. Cardiovascular: Denies chest pain. Respiratory: Denies shortness of breath. Gastrointestinal: No abdominal pain.  No nausea, no vomiting.  No diarrhea.  No constipation. Genitourinary: Worsening pain and swelling of the left groin that is extending into the scrotum. Musculoskeletal: Negative for neck pain.  Negative for back pain. Integumentary: Negative for rash. Neurological: Negative for headaches, focal weakness or numbness.   ____________________________________________   PHYSICAL EXAM:  VITAL SIGNS: ED Triage Vitals [07/17/19 2209]  Enc Vitals Group     BP (!) 141/81     Pulse Rate (!) 116     Resp 18     Temp 98.2 F (36.8 C)     Temp Source Oral     SpO2 99 %     Weight 79.4 kg (175 lb)     Height 1.753 m ('5\' 9"' )     Head Circumference      Peak Flow      Pain Score 6     Pain Loc      Pain Edu?      Excl. in [place]?     Constitutional: Alert and oriented.  Appears uncomfortable. Eyes: Conjunctivae are normal.  Head: Atraumatic. Nose: No congestion/rhinnorhea. Mouth/Throat: Mucous membranes are moist. Neck: No stridor.  No meningeal signs.   Cardiovascular: Tachycardia, regular rhythm. Good peripheral circulation. Grossly normal heart sounds. Respiratory: Normal respiratory effort.  No retractions. Gastrointestinal: Normal circumcised penis.  The patient has a large area of induration in the left groin that extends well into the scrotum.  There is no crepitus or skin changes of the surface of the scrotum but the whole area is tender.  There is no break in the skin, no purulence, and the area is very indurated but not fluctuant. Musculoskeletal: No lower extremity tenderness nor edema. No gross deformities of extremities. Neurologic:  Normal speech and language. No gross focal neurologic  deficits are appreciated.  Skin:  Skin is warm, dry and intact.  See GU exam above. Psychiatric: Mood and affect are normal. Speech and behavior are normal.  ____________________________________________   LABS (all labs ordered are listed, but only abnormal results are displayed)  Labs Reviewed  BASIC METABOLIC PANEL - Abnormal; Notable for the following components:      Result Value   Glucose, Bld 163 (*)    All other components within normal limits  GLUCOSE, CAPILLARY - Abnormal; Notable for the following components:   Glucose-Capillary 143 (*)    All other components within normal limits  SARS CORONAVIRUS 2 (HOSPITAL ORDER, [facility])  CBC WITH DIFFERENTIAL/PLATELET  CBG MONITORING, ED   ____________________________________________  EKG  No indication for EKG ____________________________________________  RADIOLOGY  I, Hinda Kehr, personally viewed and evaluated these images (plain radiographs) as part of my medical decision making, as well as reviewing the written report by the radiologist.  ED MD interpretation: Inguinal abscess extending into the scrotum  Official radiology report(s): Ct Pelvis W Contrast  Addendum Date: 07/18/2019   ADDENDUM REPORT: 07/18/2019 02:34 ADDENDUM: No subcutaneous emphysema seen within the soft tissues surrounding the perineum. These results were discussed by telephone at the time of interpretation on 07/18/2019 at 2:33 am to provider [facility] , who verbally acknowledged these results. Electronically Signed   By: Prudencio Pair M.D.   On: 07/18/2019 02:34   Result Date: 07/18/2019 CLINICAL DATA:  Scrotal mass or lung, abscess EXAM: CT PELVIS WITH CONTRAST TECHNIQUE: Multidetector CT imaging of the pelvis was performed using the standard protocol following the bolus administration of intravenous contrast. CONTRAST:  185m OMNIPAQUE IOHEXOL 300 MG/ML  SOLN COMPARISON:  None. FINDINGS: Urinary Tract: The visualized  distal ureters and bladder appear unremarkable. Bowel: No bowel wall thickening, distention or surrounding inflammation identified within the pelvis. Vascular/Lymphatic: No enlarged pelvic lymph nodes identified. No significant vascular findings. Reproductive: Within the left medial perineal soft tissues there is a peripherally enhancing loculated fluid collection which extends into the left inguinal canal along the scrotal sac. The testicle however appears to be intact. Other: Skin thickening with subcutaneous edema seen along the medial left perineal soft tissues. Musculoskeletal: No acute or significant osseous findings. IMPRESSION: Enhancing loculated fluid collection extending from the left medial perineal soft tissues into the inguinal canal and scrotal sac, consistent with abscess. Electronically Signed: By: BPrudencio PairM.D. On: 07/18/2019 02:20    ____________________________________________   PROCEDURES   Procedure(s) performed (including Critical Care):  Procedures   ____________________________________________   INITIAL IMPRESSION / MDM / [place]/ ED COURSE  As part of my medical decision making, I reviewed the following data within the [place] reviewed and incorporated, Labs reviewed , Old chart reviewed, Discussed with admitting physician (Dr. DMarcille Blanco, Discussed with urologist (Dr. BErlene Quan, A consult was requested and obtained from this/these consultant(s) Urology and Notes from prior ED visits   Differential diagnosis includes, but is not limited to, inguinal/scrotal cellulitis, scrotal abscess, developing Fournier's gangrene.  The patient is tachycardic but has reassuring lab work with no leukocytosis and a normal basic metabolic panel.  However he has type 1 diabetes and has had similar issues in the past requiring urological surgery.  The patient is very reasonable and calm and states that this is worse than it was the last time  in 2016.  The affected area definitely extends into the scrotum based on the physical exam and is likely not amenable to bedside I&D in the emergency department even if there is a drainable fluid collection, although it could just be cellulitis and phlegmon.  I will treat empirically with Zosyn 3.375 g IV and vancomycin per pharmacy consult with the option of also adding on clindamycin as for Fournier's gangrene, but the physical exam was generally reassuring and I think that this is very early in the process given the rapid onset.  I am also giving 1 L normal saline.  We will check a COVID-19 swab in case he needs to go to the OR and I have asked him to remain n.p.o.  For the best and most thorough evaluation of the area I have ordered a CT pelvis with IV contrast.  I anticipate he will likely benefit from  admission and IV antibiotics and urology consult if there is no evidence that he requires emergent surgery tonight.      Clinical Course as of Jul 17 321  Mon Jul 18, 2019  0231 The patient has a perineal abscess that extends into the scrotum.  I discussed the findings with the radiologist who interpreted the images, Dr. Westley Chandler, to verify that this does not qualify as Fournier's gangrene.  She said there is definitely a scrotal abscess that is extending from the perineal tissues but there is no sign of free air or extensive cellulitis.I have paged Dr. Erlene Quan with urology to discuss.   [CF]  [id number] I discussed the case by phone with Dr. Erlene Quan.  She agrees that the patient will need urological intervention and possibly general surgery intervention as well in the OR later today, but recommends hospitalist admission given his type 1 diabetes.  She agreed with my plan for Zosyn and vancomycin and reiterated that the patient should remain n.p.o.  I have contacted Dr. Marcille Blanco with the hospitalist service for admission.   [CF]  0321 I discussed the case by secure text with Dr. Marcille Blanco who will admit.    [CF]    Clinical Course User Index [CF] Hinda Kehr, MD     ____________________________________________  FINAL CLINICAL IMPRESSION(S) / ED DIAGNOSES  Final diagnoses:  Scrotal abscess  Inguinal abscess     MEDICATIONS GIVEN DURING THIS VISIT:  Medications  vancomycin (VANCOCIN) 2,000 mg in sodium chloride 0.9 % 500 mL IVPB (2,000 mg Intravenous New Bag/Given 07/18/19 0228)  vancomycin (VANCOCIN) IVPB 1000 mg/200 mL premix (has no administration in time range)  morphine 4 MG/ML injection 4 mg (4 mg Intravenous Given 07/18/19 0138)  ondansetron (ZOFRAN) injection 4 mg (4 mg Intravenous Given 07/18/19 0138)  sodium chloride 0.9 % bolus 1,000 mL (1,000 mLs Intravenous New Bag/Given 07/18/19 0138)  piperacillin-tazobactam (ZOSYN) IVPB 3.375 g (0 g Intravenous Stopped 07/18/19 0215)  iohexol (OMNIPAQUE) 300 MG/ML solution 100 mL (100 mLs Intravenous Contrast Given 07/18/19 0209)     ED Discharge Orders    None      *Please note:  Damond Borchers was evaluated in Emergency Department on 07/18/2019 for the symptoms described in the history of present illness. He was evaluated in the context of the global COVID-19 pandemic, which necessitated consideration that the patient might be at risk for infection with the SARS-CoV-2 virus that causes COVID-19. Institutional protocols and algorithms that pertain to the evaluation of patients at risk for COVID-19 are in a state of rapid change based on information released by regulatory bodies including the CDC and federal and state organizations. These policies and algorithms were followed during the patient's care in the ED.  Some ED evaluations and interventions may be delayed as a result of limited staffing during the pandemic.*  Note:  This document was prepared using Dragon voice recognition software and may include unintentional dictation errors.   Hinda Kehr, MD 07/18/19 [phone]

## 2019-07-18 NOTE — ED Notes (Signed)
ED TO INPATIENT HANDOFF REPORT  ED Nurse Name and Phone #: Tiyah Zelenak [phone]   S Name/Age/Gender Wesley [name] 23 y.o. male Room/Bed: ED08A/ED08A  Code Status   Code Status: Prior  Home/SNF/Other Home Patient oriented to: self, place, time and situation Is this baseline? Yes   Triage Complete: Triage complete  Chief Complaint Abscess  Triage Note Pt has an abcess to the inner left leg at the groin that started painful yesterday. Pt has a prior admission for abcess.    Allergies No Known Allergies  Level of Care/Admitting Diagnosis ED Disposition    ED Disposition Condition Comment   Admit  Hospital Area: [facility] [[id number]]  Level of Care: Med-Surg [16]  Covid Evaluation: Asymptomatic Screening Protocol (No Symptoms)  Diagnosis: Cellulitis of scrotum [409811]  Admitting Physician: Arnaldo Natal [[id number]]  Attending Physician: Arnaldo Natal [[id number]]  Estimated length of stay: past midnight tomorrow  Certification:: I certify this patient will need inpatient services for at least 2 midnights  PT Class (Do Not Modify): Inpatient [101]  PT Acc Code (Do Not Modify): Private [1]       B Medical/Surgery History Past Medical History:  Diagnosis Date  . Diabetes mellitus without complication (HCC)    type 1 diabetes   Past Surgical History:  Procedure Laterality Date  . HAND SURGERY    . INCISION AND DRAINAGE ABSCESS N/A 10/02/2015   Procedure: INCISION AND DRAINAGE ABSCESS;  Surgeon: Jerilee Field, MD;  Location: [facility] ORS;  Service: Urology;  Laterality: N/A;  . KNEE ARTHROSCOPY WITH MEDIAL MENISECTOMY Right 03/17/2019   Procedure: KNEE ARTHROSCOPY WITH MEDIAL MENISECTOMY, POSSIBLE CHONDROPLASTY RIGHT - DIABETIC;  Surgeon: Signa Kell, MD;  Location: [facility] ORS;  Service: Orthopedics;  Laterality: Right;  . TONSILLECTOMY    . TONSILLECTOMY AND ADENOIDECTOMY    . UMBILICAL HERNIA REPAIR       A IV Location/Drains/Wounds Patient  Lines/Drains/Airways Status   Active Line/Drains/Airways    Name:   Placement date:   Placement time:   Site:   Days:   Peripheral IV 10/17/18 Left Antecubital   10/17/18    1503    Antecubital   274   Peripheral IV 10/17/18 Right Antecubital   10/17/18    1516    Antecubital   274   Peripheral IV 07/18/19 Right Antecubital   07/18/19    0136    Antecubital   less than 1   Incision (Closed) 10/02/15 Groin   10/02/15    1846     1385   Incision (Closed) 03/17/19 Knee Right   03/17/19    0829     123          Intake/Output Last 24 hours  Intake/Output Summary (Last 24 hours) at 07/18/2019 0352 Last data filed at 07/18/2019 0326 Gross per 24 hour  Intake 1050 ml  Output -  Net 1050 ml    Labs/Imaging Results for orders placed or performed during the hospital encounter of 07/18/19 (from the past 48 hour(s))  CBC with Differential     Status: None   Collection Time: 07/17/19 10:14 PM  Result Value Ref Range   WBC 7.8 4.0 - 10.5 K/uL   RBC 4.76 4.22 - 5.81 MIL/uL   Hemoglobin 13.8 13.0 - 17.0 g/dL   HCT 65.7 84.6 - 96.2 %   MCV 88.4 80.0 - 100.0 fL   MCH 29.0 26.0 - 34.0 pg   MCHC 32.8 30.0 - 36.0 g/dL   RDW 12.9  11.5 - 15.5 %   Platelets 255 150 - 400 K/uL   nRBC 0.0 0.0 - 0.2 %   Neutrophils Relative % 59 %   Neutro Abs 4.6 1.7 - 7.7 K/uL   Lymphocytes Relative 30 %   Lymphs Abs 2.3 0.7 - 4.0 K/uL   Monocytes Relative 7 %   Monocytes Absolute 0.6 0.1 - 1.0 K/uL   Eosinophils Relative 3 %   Eosinophils Absolute 0.3 0.0 - 0.5 K/uL   Basophils Relative 1 %   Basophils Absolute 0.1 0.0 - 0.1 K/uL   Immature Granulocytes 0 %   Abs Immature Granulocytes 0.01 0.00 - 0.07 K/uL    Comment: Performed at [facility], [address] [place], [place] [address]  Basic metabolic panel     Status: Abnormal   Collection Time: 07/17/19 10:14 PM  Result Value Ref Range   Sodium 139 135 - 145 mmol/L   Potassium 3.8 3.5 - 5.1 mmol/L   Chloride 100 98 - 111 mmol/L   CO2 28 22  - 32 mmol/L   Glucose, Bld 163 (H) 70 - 99 mg/dL   BUN 14 6 - 20 mg/dL   Creatinine, Ser 9.520.73 0.61 - 1.24 mg/dL   Calcium 9.4 8.9 - 84.110.3 mg/dL   GFR calc non Af Amer >60 >60 mL/min   GFR calc Af Amer >60 >60 mL/min   Anion gap 11 5 - 15    Comment: Performed at [facility], [address] [place], [place] [address]  Glucose, capillary     Status: Abnormal   Collection Time: 07/17/19 10:19 PM  Result Value Ref Range   Glucose-Capillary 143 (H) 70 - 99 mg/dL   Comment 1 Notify RN    Comment 2 Document in Chart   SARS Coronavirus 2 [facility](Hospital order, Performed [facility]) Nasopharyngeal Nasopharyngeal Swab     Status: None   Collection Time: 07/18/19  2:10 AM   Specimen: Nasopharyngeal Swab  Result Value Ref Range   SARS Coronavirus 2 NEGATIVE NEGATIVE    Comment: (NOTE) If result is NEGATIVE SARS-CoV-2 target nucleic acids are NOT DETECTED. The SARS-CoV-2 RNA is generally detectable in upper and lower  respiratory specimens during the acute phase of infection. The lowest  concentration of SARS-CoV-2 viral copies this assay can detect is 250  copies / mL. A negative result does not preclude SARS-CoV-2 infection  and should not be used as the sole basis for treatment or other  patient management decisions.  A negative result may occur with  improper specimen collection / handling, submission of specimen other  than nasopharyngeal swab, presence of viral mutation(s) within the  areas targeted by this assay, and inadequate number of viral copies  (<250 copies / mL). A negative result must be combined with clinical  observations, patient history, and epidemiological information. If result is POSITIVE SARS-CoV-2 target nucleic acids are DETECTED. The SARS-CoV-2 RNA is generally detectable in upper and lower  respiratory specimens dur ing the acute phase of infection.  Positive  results are indicative of active infection with SARS-CoV-2.  Clinical   correlation with patient history and other diagnostic information is  necessary to determine patient infection status.  Positive results do  not rule out bacterial infection or co-infection with other viruses. If result is PRESUMPTIVE POSTIVE SARS-CoV-2 nucleic acids MAY BE PRESENT.   A presumptive positive result was obtained on the submitted specimen  and confirmed on repeat testing.  While 2019 novel coronavirus  (  SARS-CoV-2) nucleic acids may be present in the submitted sample  additional confirmatory testing may be necessary for epidemiological  and / or clinical management purposes  to differentiate between  SARS-CoV-2 and other Sarbecovirus currently known to infect humans.  If clinically indicated additional testing with an alternate test  methodology [phone]) is advised. The SARS-CoV-2 RNA is generally  detectable in upper and lower respiratory sp ecimens during the acute  phase of infection. The expected result is Negative. Fact Sheet for Patients:  StrictlyIdeas.no Fact Sheet for Healthcare Providers: BankingDealers.co.za This test is not yet approved or cleared by the [place] FDA and has been authorized for detection and/or diagnosis of SARS-CoV-2 by FDA under an Emergency Use Authorization (EUA).  This EUA will remain in effect (meaning this test can be used) for the duration of the COVID-19 declaration under Section 564(b)(1) of the Act, 21 U.S.C. section 360bbb-3(b)(1), unless the authorization is terminated or revoked sooner. Performed at [facility], [address] [place], [place] [address]    Ct Pelvis W Contrast  Addendum Date: 07/18/2019   ADDENDUM REPORT: 07/18/2019 02:34 ADDENDUM: No subcutaneous emphysema seen within the soft tissues surrounding the perineum. These results were discussed by telephone at the time of interpretation on 07/18/2019 at 2:33 am to provider [facility] , who verbally  acknowledged these results. Electronically Signed   By: Prudencio Pair M.D.   On: 07/18/2019 02:34   Result Date: 07/18/2019 CLINICAL DATA:  Scrotal mass or lung, abscess EXAM: CT PELVIS WITH CONTRAST TECHNIQUE: Multidetector CT imaging of the pelvis was performed using the standard protocol following the bolus administration of intravenous contrast. CONTRAST:  177mL OMNIPAQUE IOHEXOL 300 MG/ML  SOLN COMPARISON:  None. FINDINGS: Urinary Tract: The visualized distal ureters and bladder appear unremarkable. Bowel: No bowel wall thickening, distention or surrounding inflammation identified within the pelvis. Vascular/Lymphatic: No enlarged pelvic lymph nodes identified. No significant vascular findings. Reproductive: Within the left medial perineal soft tissues there is a peripherally enhancing loculated fluid collection which extends into the left inguinal canal along the scrotal sac. The testicle however appears to be intact. Other: Skin thickening with subcutaneous edema seen along the medial left perineal soft tissues. Musculoskeletal: No acute or significant osseous findings. IMPRESSION: Enhancing loculated fluid collection extending from the left medial perineal soft tissues into the inguinal canal and scrotal sac, consistent with abscess. Electronically Signed: By: Prudencio Pair M.D. On: 07/18/2019 02:20    Pending Labs [employer] (From admission, onward)    Start     Ordered   Signed and Held  HIV Antibody (routine testing w rflx)  (HIV Antibody (Routine testing w reflex) panel)  Once,   R     Signed and Held   Signed and Held  HIV4GL Save Tube  (HIV Antibody (Routine testing w reflex) panel)  Once,   R     Signed and Held   Signed and Held  Hemoglobin A1c  Once,   R    Comments: To assess prior glycemic control    Signed and Held   Signed and Held  TSH  Add-on,   R     Signed and Held          Vitals/Pain Today's Vitals   07/17/19 2209  BP: (!) 141/81  Pulse: (!) 116  Resp: 18   Temp: 98.2 F (36.8 C)  TempSrc: Oral  SpO2: 99%  Weight: 79.4 kg  Height: 5\' 9"  (1.753 m)  PainSc: 6  Isolation Precautions No active isolations  Medications Medications  vancomycin (VANCOCIN) 2,000 mg in sodium chloride 0.9 % 500 mL IVPB (2,000 mg Intravenous New Bag/Given 07/18/19 0228)  vancomycin (VANCOCIN) IVPB 1000 mg/200 mL premix (has no administration in time range)  morphine 4 MG/ML injection 4 mg (4 mg Intravenous Given 07/18/19 0138)  ondansetron (ZOFRAN) injection 4 mg (4 mg Intravenous Given 07/18/19 0138)  sodium chloride 0.9 % bolus 1,000 mL (0 mLs Intravenous Stopped 07/18/19 0326)  piperacillin-tazobactam (ZOSYN) IVPB 3.375 g (0 g Intravenous Stopped 07/18/19 0215)  iohexol (OMNIPAQUE) 300 MG/ML solution 100 mL (100 mLs Intravenous Contrast Given 07/18/19 0209)    Mobility walks Low fall risk   Focused Assessments skin   R Recommendations: See Admitting Provider Note  Report given to:   Additional Notes:

## 2019-07-18 NOTE — Consult Note (Signed)
Urology Consult  I have been asked to see the patient by Dr. York CeriseForbach, for evaluation and management of left inguinal abscess.  Chief Complaint: Left inguinal tenderness, enlarging abscess  History of Present Illness: Wesley [name] is a 23 y.o. year old male who presented to the ED yesterday with complaints of an enlarging area of swelling and tenderness on the left side of his groin.  PMH significant for DM1 with history of DKA in the past scrotal cellulitis with abscess requiring I&D approximately 3 to 4 years ago.  CT pelvis with contrast revealed a loculated collection of fluid extending from the left medial perineum into the inguinal canal and scrotal sac without subcutaneous emphysema.  He was admitted for management of left inguinal abscess with extension into the left scrotum.  Patient reports first noticing swelling and tenderness of the left inguinal crease 1 week ago.  He states this area has become increasingly tender and enlarged since.  He denies drainage from the site, fevers, and chills.  He does report occasional use of Nair hair removal products that cause him problematic "shave bumps".  He reports his last scrotal abscess I&D approximately 3 to 4 years ago was left open, requiring packing.   He is afebrile, vitals this morning within normal limits.  WBC count 7.8.  He has been n.p.o. since last night.  Broad spectrum antibiotics as below.  Anti-infectives (From admission, onward)   Start     Dose/Rate Route Frequency Ordered Stop   07/18/19 1500  vancomycin (VANCOCIN) 1,750 mg in sodium chloride 0.9 % 500 mL IVPB     1,750 mg 250 mL/hr over 120 Minutes Intravenous Every 12 hours 07/18/19 0315     07/18/19 0230  vancomycin (VANCOCIN) 2,000 mg in sodium chloride 0.9 % 500 mL IVPB     2,000 mg 250 mL/hr over 120 Minutes Intravenous  Once 07/18/19 0145 07/18/19 0611   07/18/19 0145  piperacillin-tazobactam (ZOSYN) IVPB 3.375 g     3.375 g 100 mL/hr over 30 Minutes  Intravenous  Once 07/18/19 0143 07/18/19 0215      Past Medical History:  Diagnosis Date  . Diabetes mellitus without complication (HCC)    type 1 diabetes    Past Surgical History:  Procedure Laterality Date  . HAND SURGERY    . INCISION AND DRAINAGE ABSCESS N/A 10/02/2015   Procedure: INCISION AND DRAINAGE ABSCESS;  Surgeon: Jerilee FieldMatthew Eskridge, MD;  Location: [facility] ORS;  Service: Urology;  Laterality: N/A;  . KNEE ARTHROSCOPY WITH MEDIAL MENISECTOMY Right 03/17/2019   Procedure: KNEE ARTHROSCOPY WITH MEDIAL MENISECTOMY, POSSIBLE CHONDROPLASTY RIGHT - DIABETIC;  Surgeon: Signa KellPatel, Sunny, MD;  Location: [facility] ORS;  Service: Orthopedics;  Laterality: Right;  . TONSILLECTOMY    . TONSILLECTOMY AND ADENOIDECTOMY    . UMBILICAL HERNIA REPAIR      Home Medications:  Current Meds  Medication Sig  . insulin NPH-regular Human (70-30) 100 UNIT/ML injection Inject 40 Units into the skin every morning.  . insulin regular (NOVOLIN R) 100 units/mL injection Inject 1-10 Units into the skin 3 (three) times daily before meals. Per sliding scale    Allergies: No Known Allergies  Family History  Problem Relation Age of Onset  . Diabetes Mellitus II Paternal Grandfather   . Hypertension Mother     Social History:  reports that he has quit smoking. His smoking use included cigarettes. He has a 2.00 pack-year smoking history. He has never used smokeless tobacco. He reports current alcohol use. He  reports current drug use. Drug: Marijuana.  ROS: A complete review of systems was performed.  All systems are negative except for pertinent findings as noted.  Physical Exam:  Vital signs in last 24 hours: Temp:  [97.6 F (36.4 C)-98.2 F (36.8 C)] 97.6 F (36.4 C) (10/05 0438) Pulse Rate:  [88-116] 88 (10/05 0438) Resp:  [18-20] 20 (10/05 0438) BP: (120-141)/(69-81) 120/69 (10/05 0438) SpO2:  [99 %-100 %] 100 % (10/05 0438) Weight:  [78.8 kg-79.4 kg] 78.8 kg (10/05 0438) Constitutional:  Alert and  oriented, no acute distress HEENT: [place] AT, moist mucus membranes Cardiovascular: No clubbing, cyanosis, or edema. Respiratory: Normal respiratory effort, lungs clear bilaterally GU: Region of tender fluctuance approximately 4 cm in length on the left inguinal crease adjacent to the perineum with extension into the left scrotum.  No visualized drainage or open wounds associated with this. Neurologic: Grossly intact, no focal deficits, moving all 4 extremities Psychiatric: Normal mood and affect   Laboratory Data:  Recent Labs    07/17/19 2214  WBC 7.8  HGB 13.8  HCT 42.1   Recent Labs    07/17/19 2214  NA 139  K 3.8  CL 100  CO2 28  GLUCOSE 163*  BUN 14  CREATININE 0.73  CALCIUM 9.4    Results for orders placed or performed during the hospital encounter of 07/18/19  SARS Coronavirus 2 [facility] order, Performed in [facility] hospital lab) Nasopharyngeal Nasopharyngeal Swab     Status: None   Collection Time: 07/18/19  2:10 AM   Specimen: Nasopharyngeal Swab  Result Value Ref Range Status   SARS Coronavirus 2 NEGATIVE NEGATIVE Final    Comment: (NOTE) If result is NEGATIVE SARS-CoV-2 target nucleic acids are NOT DETECTED. The SARS-CoV-2 RNA is generally detectable in upper and lower  respiratory specimens during the acute phase of infection. The lowest  concentration of SARS-CoV-2 viral copies this assay can detect is 250  copies / mL. A negative result does not preclude SARS-CoV-2 infection  and should not be used as the sole basis for treatment or other  patient management decisions.  A negative result may occur with  improper specimen collection / handling, submission of specimen other  than nasopharyngeal swab, presence of viral mutation(s) within the  areas targeted by this assay, and inadequate number of viral copies  (<250 copies / mL). A negative result must be combined with clinical  observations, patient history, and epidemiological information. If result is  POSITIVE SARS-CoV-2 target nucleic acids are DETECTED. The SARS-CoV-2 RNA is generally detectable in upper and lower  respiratory specimens dur ing the acute phase of infection.  Positive  results are indicative of active infection with SARS-CoV-2.  Clinical  correlation with patient history and other diagnostic information is  necessary to determine patient infection status.  Positive results do  not rule out bacterial infection or co-infection with other viruses. If result is PRESUMPTIVE POSTIVE SARS-CoV-2 nucleic acids MAY BE PRESENT.   A presumptive positive result was obtained on the submitted specimen  and confirmed on repeat testing.  While 2019 novel coronavirus  (SARS-CoV-2) nucleic acids may be present in the submitted sample  additional confirmatory testing may be necessary for epidemiological  and / or clinical management purposes  to differentiate between  SARS-CoV-2 and other Sarbecovirus currently known to infect humans.  If clinically indicated additional testing with an alternate test  methodology [phone]) is advised. The SARS-CoV-2 RNA is generally  detectable in upper and lower respiratory sp ecimens  during the acute  phase of infection. The expected result is Negative. Fact Sheet for Patients:  BoilerBrush.com.cy Fact Sheet for Healthcare Providers: https://pope.com/ This test is not yet approved or cleared by the [place] FDA and has been authorized for detection and/or diagnosis of SARS-CoV-2 by FDA under an Emergency Use Authorization (EUA).  This EUA will remain in effect (meaning this test can be used) for the duration of the COVID-19 declaration under Section 564(b)(1) of the Act, 21 U.S.C. section 360bbb-3(b)(1), unless the authorization is terminated or revoked sooner. Performed at [facility], [address] [place] [place], [place] [address]     Radiologic Imaging: Ct Pelvis W Contrast   Addendum Date: 07/18/2019   ADDENDUM REPORT: 07/18/2019 02:34 ADDENDUM: No subcutaneous emphysema seen within the soft tissues surrounding the perineum. These results were discussed by telephone at the time of interpretation on 07/18/2019 at 2:33 am to provider [facility] , who verbally acknowledged these results. Electronically Signed   By: Jonna Clark M.D.   On: 07/18/2019 02:34   Result Date: 07/18/2019 CLINICAL DATA:  Scrotal mass or lung, abscess EXAM: CT PELVIS WITH CONTRAST TECHNIQUE: Multidetector CT imaging of the pelvis was performed using the standard protocol following the bolus administration of intravenous contrast. CONTRAST:  OMNIPAQUE IOHEXOL 300 MG/ML  SOLN COMPARISON:  None. FINDINGS: Urinary Tract: The visualized distal ureters and bladder appear unremarkable. Bowel: No bowel wall thickening, distention or surrounding inflammation identified within the pelvis. Vascular/Lymphatic: No enlarged pelvic lymph nodes identified. No significant vascular findings. Reproductive: Within the left medial perineal soft tissues there is a peripherally enhancing loculated fluid collection which extends into the left inguinal canal along the scrotal sac. The testicle however appears to be intact. Other: Skin thickening with subcutaneous edema seen along the medial left perineal soft tissues. Musculoskeletal: No acute or significant osseous findings. IMPRESSION: Enhancing loculated fluid collection extending from the left medial perineal soft tissues into the inguinal canal and scrotal sac, consistent with abscess. Electronically Signed: By: Jonna Clark M.D. On: 07/18/2019 02:20   I personally reviewed the imaging above and note the presence of a left inguinal abscess with extension into the scrotum.  Assessment & Plan:  Patient with a 1 week history of left inguinal abscess extending into the left scrotum.  No evidence of Fournier's gangrene.  Patient is afebrile with a normal white cell count.  I  do not suspect systemic infection at this time.  We will plan to proceed for OR I&D of the abscess late this morning with plans for intraoperative culture collection to target antibiotic therapy.  I counseled the patient that his wound would likely remain open, requiring packing.  He is familiar with this, as he is had similar wound closures in the past.  Patient is n.p.o. and can proceed to the OR at this time.  Thank you for involving me in this patient's care, I will continue to follow along.  Carman Ching, PA-C 07/18/2019 8:24 AM

## 2019-07-18 NOTE — H&P (Signed)
Wesley [name] is an 23 y.o. male.   Chief Complaint: Groin pain HPI: The patient with past medical history of diabetes type 1 presents to the emergency department due to groin pain.  The patient states that he noticed an area of swelling and tenderness approximately 1 week ago but that the area of discomfort seems to have spread and become more painful over the last 2 days.  He denies fevers, nausea, vomiting or diarrhea.  CT scan of his groin revealed the left inguinal abscess with extension into the perineum and scrotum.  Urology was contacted for reassurance of the absence of Fournier's gangrene.  They agreed to see the patient following admission.  Thus the emergency department staff, hospitalist service for admission orders.  Past Medical History:  Diagnosis Date  . Diabetes mellitus without complication ([place])    type 1 diabetes    Past Surgical History:  Procedure Laterality Date  . HAND SURGERY    . INCISION AND DRAINAGE ABSCESS N/A 10/02/2015   Procedure: INCISION AND DRAINAGE ABSCESS;  Surgeon: Festus Aloe, MD;  Location: [facility] ORS;  Service: Urology;  Laterality: N/A;  . KNEE ARTHROSCOPY WITH MEDIAL MENISECTOMY Right 03/17/2019   Procedure: KNEE ARTHROSCOPY WITH MEDIAL MENISECTOMY, POSSIBLE CHONDROPLASTY RIGHT - DIABETIC;  Surgeon: Leim Fabry, MD;  Location: [facility] ORS;  Service: Orthopedics;  Laterality: Right;  . TONSILLECTOMY    . TONSILLECTOMY AND ADENOIDECTOMY    . UMBILICAL HERNIA REPAIR      Family History  Problem Relation Age of Onset  . Diabetes Mellitus II Paternal Grandfather   . Hypertension Mother    Social History:  reports that he has quit smoking. His smoking use included cigarettes. He has a 2.00 pack-year smoking history. He has never used smokeless tobacco. He reports current alcohol use. He reports current drug use. Drug: Marijuana.  Allergies: No Known Allergies  Medications Prior to Admission  Medication Sig Dispense Refill  . insulin NPH-regular  Human (70-30) 100 UNIT/ML injection Inject 40 Units into the skin every morning.    . insulin regular (NOVOLIN R) 100 units/mL injection Inject 1-10 Units into the skin 3 (three) times daily before meals. Per sliding scale    . blood glucose meter kit and supplies KIT Dispense based on patient and insurance preference. Use up to four times daily as directed. (FOR ICD-9 250.00, 250.01). 1 each 0  . HYDROcodone-acetaminophen (NORCO) 5-325 MG tablet Take 1-2 tablets by mouth every 4 (four) hours as needed for moderate pain or severe pain. (Patient not taking: Reported on 07/18/2019) 15 tablet 0  . ondansetron (ZOFRAN ODT) 4 MG disintegrating tablet Take 1 tablet (4 mg total) by mouth every 8 (eight) hours as needed for nausea or vomiting. (Patient not taking: Reported on 07/18/2019) 20 tablet 0    Results for orders placed or performed during the hospital encounter of 07/18/19 (from the past 48 hour(s))  CBC with Differential     Status: None   Collection Time: 07/17/19 10:14 PM  Result Value Ref Range   WBC 7.8 4.0 - 10.5 K/uL   RBC 4.76 4.22 - 5.81 MIL/uL   Hemoglobin 13.8 13.0 - 17.0 g/dL   HCT 42.1 39.0 - 52.0 %   MCV 88.4 80.0 - 100.0 fL   MCH 29.0 26.0 - 34.0 pg   MCHC 32.8 30.0 - 36.0 g/dL   RDW 12.9 11.5 - 15.5 %   Platelets 255 150 - 400 K/uL   nRBC 0.0 0.0 - 0.2 %   Neutrophils  Relative % 59 %   Neutro Abs 4.6 1.7 - 7.7 K/uL   Lymphocytes Relative 30 %   Lymphs Abs 2.3 0.7 - 4.0 K/uL   Monocytes Relative 7 %   Monocytes Absolute 0.6 0.1 - 1.0 K/uL   Eosinophils Relative 3 %   Eosinophils Absolute 0.3 0.0 - 0.5 K/uL   Basophils Relative 1 %   Basophils Absolute 0.1 0.0 - 0.1 K/uL   Immature Granulocytes 0 %   Abs Immature Granulocytes 0.01 0.00 - 0.07 K/uL    Comment: Performed at [facility], [address] [place], [place] [address]  Basic metabolic panel     Status: Abnormal   Collection Time: 07/17/19 10:14 PM  Result Value Ref Range   Sodium 139 135 - 145  mmol/L   Potassium 3.8 3.5 - 5.1 mmol/L   Chloride 100 98 - 111 mmol/L   CO2 28 22 - 32 mmol/L   Glucose, Bld 163 (H) 70 - 99 mg/dL   BUN 14 6 - 20 mg/dL   Creatinine, Ser 0.73 0.61 - 1.24 mg/dL   Calcium 9.4 8.9 - 10.3 mg/dL   GFR calc non Af Amer >60 >60 mL/min   GFR calc Af Amer >60 >60 mL/min   Anion gap 11 5 - 15    Comment: Performed at [facility], [address] [place], [place] [address]  Glucose, capillary     Status: Abnormal   Collection Time: 07/17/19 10:19 PM  Result Value Ref Range   Glucose-Capillary 143 (H) 70 - 99 mg/dL   Comment 1 Notify RN    Comment 2 Document in Chart   SARS Coronavirus 2 [facility] order, Performed in [facility] hospital lab) Nasopharyngeal Nasopharyngeal Swab     Status: None   Collection Time: 07/18/19  2:10 AM   Specimen: Nasopharyngeal Swab  Result Value Ref Range   SARS Coronavirus 2 NEGATIVE NEGATIVE    Comment: (NOTE) If result is NEGATIVE SARS-CoV-2 target nucleic acids are NOT DETECTED. The SARS-CoV-2 RNA is generally detectable in upper and lower  respiratory specimens during the acute phase of infection. The lowest  concentration of SARS-CoV-2 viral copies this assay can detect is 250  copies / mL. A negative result does not preclude SARS-CoV-2 infection  and should not be used as the sole basis for treatment or other  patient management decisions.  A negative result may occur with  improper specimen collection / handling, submission of specimen other  than nasopharyngeal swab, presence of viral mutation(s) within the  areas targeted by this assay, and inadequate number of viral copies  (<250 copies / mL). A negative result must be combined with clinical  observations, patient history, and epidemiological information. If result is POSITIVE SARS-CoV-2 target nucleic acids are DETECTED. The SARS-CoV-2 RNA is generally detectable in upper and lower  respiratory specimens dur ing the acute phase of infection.   Positive  results are indicative of active infection with SARS-CoV-2.  Clinical  correlation with patient history and other diagnostic information is  necessary to determine patient infection status.  Positive results do  not rule out bacterial infection or co-infection with other viruses. If result is PRESUMPTIVE POSTIVE SARS-CoV-2 nucleic acids MAY BE PRESENT.   A presumptive positive result was obtained on the submitted specimen  and confirmed on repeat testing.  While 2019 novel coronavirus  (SARS-CoV-2) nucleic acids may be present in the submitted sample  additional confirmatory testing may be necessary for epidemiological  and /  or clinical management purposes  to differentiate between  SARS-CoV-2 and other Sarbecovirus currently known to infect humans.  If clinically indicated additional testing with an alternate test  methodology [id number]) is advised. The SARS-CoV-2 RNA is generally  detectable in upper and lower respiratory sp ecimens during the acute  phase of infection. The expected result is Negative. Fact Sheet for Patients:  StrictlyIdeas.no Fact Sheet for Healthcare Providers: BankingDealers.co.za This test is not yet approved or cleared by the [place] FDA and has been authorized for detection and/or diagnosis of SARS-CoV-2 by FDA under an Emergency Use Authorization (EUA).  This EUA will remain in effect (meaning this test can be used) for the duration of the COVID-19 declaration under Section 564(b)(1) of the Act, 21 U.S.C. section 360bbb-3(b)(1), unless the authorization is terminated or revoked sooner. Performed at [facility], [address] [place], [place] [address]   Glucose, capillary     Status: None   Collection Time: 07/18/19  4:56 AM  Result Value Ref Range   Glucose-Capillary 83 70 - 99 mg/dL  TSH     Status: None   Collection Time: 07/18/19  5:47 AM  Result Value Ref Range   TSH 3.655  0.350 - 4.500 uIU/mL    Comment: Performed by a 3rd Generation assay with a functional sensitivity of <=0.01 uIU/mL. Performed at [facility], [address] [place], [place] [address]    Ct Pelvis W Contrast  Addendum Date: 07/18/2019   ADDENDUM REPORT: 07/18/2019 02:34 ADDENDUM: No subcutaneous emphysema seen within the soft tissues surrounding the perineum. These results were discussed by telephone at the time of interpretation on 07/18/2019 at 2:33 am to provider [facility] , who verbally acknowledged these results. Electronically Signed   By: Prudencio Pair M.D.   On: 07/18/2019 02:34   Result Date: 07/18/2019 CLINICAL DATA:  Scrotal mass or lung, abscess EXAM: CT PELVIS WITH CONTRAST TECHNIQUE: Multidetector CT imaging of the pelvis was performed using the standard protocol following the bolus administration of intravenous contrast. CONTRAST:  149m OMNIPAQUE IOHEXOL 300 MG/ML  SOLN COMPARISON:  None. FINDINGS: Urinary Tract: The visualized distal ureters and bladder appear unremarkable. Bowel: No bowel wall thickening, distention or surrounding inflammation identified within the pelvis. Vascular/Lymphatic: No enlarged pelvic lymph nodes identified. No significant vascular findings. Reproductive: Within the left medial perineal soft tissues there is a peripherally enhancing loculated fluid collection which extends into the left inguinal canal along the scrotal sac. The testicle however appears to be intact. Other: Skin thickening with subcutaneous edema seen along the medial left perineal soft tissues. Musculoskeletal: No acute or significant osseous findings. IMPRESSION: Enhancing loculated fluid collection extending from the left medial perineal soft tissues into the inguinal canal and scrotal sac, consistent with abscess. Electronically Signed: By: BPrudencio PairM.D. On: 07/18/2019 02:20    Review of Systems  Constitutional: Negative for chills and fever.  HENT: Negative for sore  throat and tinnitus.   Eyes: Negative for blurred vision and redness.  Respiratory: Negative for cough and shortness of breath.   Cardiovascular: Negative for chest pain, palpitations, orthopnea and PND.  Gastrointestinal: Negative for abdominal pain, diarrhea, nausea and vomiting.  Genitourinary: Negative for dysuria, frequency and urgency.       Groin pain  Musculoskeletal: Negative for joint pain and myalgias.  Skin: Negative for rash.       No lesions  Neurological: Negative for speech change, focal weakness and weakness.  Endo/Heme/Allergies: Does not bruise/bleed easily.  No temperature intolerance  Psychiatric/Behavioral: Negative for depression and suicidal ideas.    Blood pressure 120/69, pulse 88, temperature 97.6 F (36.4 C), temperature source Oral, resp. rate 20, height '5\' 11"'  (1.803 m), weight 78.8 kg, SpO2 100 %. Physical Exam  Vitals reviewed. Constitutional: He is oriented to person, place, and time. He appears well-developed and well-nourished. No distress.  HENT:  Head: Normocephalic and atraumatic.  Mouth/Throat: Oropharynx is clear and moist.  Eyes: Pupils are equal, round, and reactive to light. Conjunctivae and EOM are normal. No scleral icterus.  Neck: Normal range of motion. Neck supple. No JVD present. No tracheal deviation present. No thyromegaly present.  Cardiovascular: Normal rate, regular rhythm and normal heart sounds. Exam reveals no gallop and no friction rub.  No murmur heard. Respiratory: Effort normal and breath sounds normal. No respiratory distress.  GI: Soft. Bowel sounds are normal. He exhibits no distension. There is no abdominal tenderness.  Genitourinary:    Genitourinary Comments: Deferred   Musculoskeletal: Normal range of motion.        General: No edema.  Lymphadenopathy:    He has no cervical adenopathy.  Neurological: He is alert and oriented to person, place, and time. No cranial nerve deficit.  Skin: Skin is warm and dry.  No rash noted. No erythema.  Psychiatric: He has a normal mood and affect. His behavior is normal. Judgment and thought content normal.     Assessment/Plan This is a 23 year old male admitted for abscess and cellulitis of the scrotum. 1.  Abscess: Left inguinal region with extension of cellulitis into scrotum.  No evidence of Fournier's gangrene.  The patient is afebrile.  He does not meet criteria for sepsis.  He is hemodynamically stable.  Continue Vanco and Zosyn.  Urology and the surgical service to consult regarding plan of care. 2.  Diabetes mellitus type 1: Continue basal insulin therapy adjusted for n.p.o. diet.  Continue sliding scale insulin as well. 3.  DVT prophylaxis: SCDs 4.  GI prophylaxis: None The patient is a full code.  Time spent on admission orders and patient care approximately 45 minutes  Harrie Foreman, MD 07/18/2019, 7:08 AM

## 2019-07-18 NOTE — Anesthesia Preprocedure Evaluation (Addendum)
Anesthesia Evaluation  Patient identified by MRN, date of birth, ID band Patient awake    Reviewed: Allergy & Precautions, H&P , NPO status , Patient's Chart, lab work & pertinent test results  History of Anesthesia Complications Negative for: history of anesthetic complications  Airway Mallampati: II  TM Distance: >3 FB Neck ROM: full    Dental  (+) Teeth Intact   Pulmonary neg recent URI, Patient abstained from smoking., former smoker,           Cardiovascular (-) anginanegative cardio ROS       Neuro/Psych negative neurological ROS  negative psych ROS   GI/Hepatic negative GI ROS, Neg liver ROS, neg GERD  ,  Endo/Other  diabetes, Type 1, Insulin Dependent  Renal/GU      Musculoskeletal   Abdominal   Peds  Hematology negative hematology ROS (+)   Anesthesia Other Findings Past Medical History: No date: Diabetes mellitus without complication ([place])     Comment:  type 1 diabetes  Past Surgical History: No date: HAND SURGERY 10/02/2015: INCISION AND DRAINAGE ABSCESS; N/A     Comment:  Procedure: INCISION AND DRAINAGE ABSCESS;  Surgeon:               Festus Aloe, MD;  Location: [facility] ORS;  Service:               Urology;  Laterality: N/A; 03/17/2019: KNEE ARTHROSCOPY WITH MEDIAL MENISECTOMY; Right     Comment:  Procedure: KNEE ARTHROSCOPY WITH MEDIAL MENISECTOMY,               POSSIBLE CHONDROPLASTY RIGHT - DIABETIC;  Surgeon: Leim Fabry, MD;  Location: [facility] ORS;  Service: Orthopedics;                Laterality: Right; No date: TONSILLECTOMY No date: TONSILLECTOMY AND ADENOIDECTOMY No date: UMBILICAL HERNIA REPAIR  BMI    Body Mass Index: 24.23 kg/m      Reproductive/Obstetrics negative OB ROS                            Anesthesia Physical Anesthesia Plan  ASA: II  Anesthesia Plan: General LMA   Post-op Pain Management:    Induction:   PONV Risk Score  and Plan: Dexamethasone, Ondansetron, Midazolam and Treatment may vary due to age or medical condition  Airway Management Planned:   Additional Equipment:   Intra-op Plan:   Post-operative Plan:   Informed Consent: I have reviewed the patients History and Physical, chart, labs and discussed the procedure including the risks, benefits and alternatives for the proposed anesthesia with the patient or authorized representative who has indicated his/her understanding and acceptance.     Dental Advisory Given  Plan Discussed with: Anesthesiologist and CRNA  Anesthesia Plan Comments:         Anesthesia Quick Evaluation

## 2019-07-18 NOTE — Anesthesia Post-op Follow-up Note (Signed)
Anesthesia QCDR form completed.        

## 2019-07-18 NOTE — Anesthesia Postprocedure Evaluation (Signed)
Anesthesia Post Note  Patient: Wesley [name]  Procedure(s) Performed: INCISION AND DRAINAGE SCROTAL ABSCESS (N/A )  Patient location during evaluation: PACU Anesthesia Type: General Level of consciousness: awake and alert Pain management: pain level controlled Vital Signs Assessment: post-procedure vital signs reviewed and stable Respiratory status: spontaneous breathing, nonlabored ventilation and respiratory function stable Cardiovascular status: blood pressure returned to baseline and stable Postop Assessment: no apparent nausea or vomiting Anesthetic complications: no     Last Vitals:  Vitals:   07/18/19 1356 07/18/19 1427  BP: 123/83 107/66  Pulse: 64 60  Resp: 10 16  Temp: (!) 36.4 C 36.7 C  SpO2: 100% 100%    Last Pain:  Vitals:   07/18/19 1427  TempSrc: Oral  PainSc: 0-No pain                 Durenda Hurt

## 2019-07-18 NOTE — Progress Notes (Signed)
Pharmacy Antibiotic Note  Wesley [name] is a 23 y.o. male admitted on 07/18/2019 with cellulitis / scrotal abscess.  Pharmacy has been consulted for Vancomycin dosing.  Vancomycin loading dose of 2gm ordered to be given in ED.  Plan: Vancomycin 1750 mg IV Q 12 hrs. Goal AUC 400-550. Expected AUC: 491.3 SCr used: 0.8  Height: 5\' 9"  (175.3 cm) Weight: 175 lb (79.4 kg) IBW/kg (Calculated) : 70.7  Temp (24hrs), Avg:98.2 F (36.8 C), Min:98.2 F (36.8 C), Max:98.2 F (36.8 C)  Recent Labs  Lab 07/17/19 2214  WBC 7.8  CREATININE 0.73    Estimated Creatinine Clearance: 144.8 mL/min (by C-G formula based on SCr of 0.73 mg/dL).    No Known Allergies  Antimicrobials this admission: Zosyn 10/5 >>  Vancomycin 10/5 >>   Dose adjustments this admission:  Microbiology results:  BCx:   UCx:    Sputum:    MRSA PCR:   Thank you for allowing pharmacy to be a part of this patient's care.  Hart Robinsons A 07/18/2019 1:47 AM

## 2019-07-19 ENCOUNTER — Encounter: Payer: Self-pay | Admitting: Urology

## 2019-07-19 DIAGNOSIS — N492 Inflammatory disorders of scrotum: Secondary | ICD-10-CM | POA: Diagnosis not present

## 2019-07-19 LAB — BASIC METABOLIC PANEL
Anion gap: 6 (ref 5–15)
BUN: 9 mg/dL (ref 6–20)
CO2: 27 mmol/L (ref 22–32)
Calcium: 8.6 mg/dL — ABNORMAL LOW (ref 8.9–10.3)
Chloride: 104 mmol/L (ref 98–111)
Creatinine, Ser: 0.73 mg/dL (ref 0.61–1.24)
GFR calc Af Amer: >60 mL/min (ref >60)
GFR calc non Af Amer: >60 mL/min (ref >60)
Glucose, Bld: 78 mg/dL (ref 70–99)
Potassium: 3.7 mmol/L (ref 3.5–5.1)
Sodium: 137 mmol/L (ref 135–145)

## 2019-07-19 LAB — CBC
HCT: 36.7 % — ABNORMAL LOW (ref 39.0–52.0)
Hemoglobin: 12.1 g/dL — ABNORMAL LOW (ref 13.0–17.0)
MCH: 29.4 pg (ref 26.0–34.0)
MCHC: 33 g/dL (ref 30.0–36.0)
MCV: 89.1 fL (ref 80.0–100.0)
Platelets: 197 10*3/uL (ref 150–400)
RBC: 4.12 MIL/uL — ABNORMAL LOW (ref 4.22–5.81)
RDW: 12.8 % (ref 11.5–15.5)
WBC: 6.9 10*3/uL (ref 4.0–10.5)
nRBC: 0 % (ref 0.0–0.2)

## 2019-07-19 LAB — GLUCOSE, CAPILLARY
Glucose-Capillary: 69 mg/dL — ABNORMAL LOW (ref 70–99)
Glucose-Capillary: 184 mg/dL — ABNORMAL HIGH (ref 70–99)
Glucose-Capillary: 171 mg/dL — ABNORMAL HIGH (ref 70–99)

## 2019-07-19 MED ORDER — SULFAMETHOXAZOLE-TRIMETHOPRIM 800-160 MG PO TABS: 1.0000 | ORAL_TABLET | Freq: Two times a day (BID) | ORAL | 0 refills | Status: DC

## 2019-07-19 MED ORDER — OXYCODONE-ACETAMINOPHEN 5-325 MG PO TABS: 1.0000 | ORAL_TABLET | Freq: Four times a day (QID) | ORAL | 0 refills | Status: DC | PRN

## 2019-07-19 MED ORDER — SODIUM CHLORIDE 0.9 % IV SOLN
INTRAVENOUS | Status: DC | PRN
  Administered 2019-07-19: 250 mL via INTRAVENOUS

## 2019-07-19 NOTE — Progress Notes (Signed)
Sherre Scarlet to be D/C'd home per MD order.  Discussed prescriptions and follow up appointments with the patient. Prescriptions given to patient, medication list explained in detail. Pt verbalized understanding.  Allergies as of 07/19/2019   No Known Allergies     Medication List    STOP taking these medications   HYDROcodone-acetaminophen 5-325 MG tablet Commonly known as: Norco   ondansetron 4 MG disintegrating tablet Commonly known as: Zofran ODT     TAKE these medications   blood glucose meter kit and supplies Kit Dispense based on patient and insurance preference. Use up to four times daily as directed. (FOR ICD-9 250.00, 250.01).   insulin NPH-regular Human (70-30) 100 UNIT/ML injection Inject 40 Units into the skin every morning.   insulin regular 100 units/mL injection Commonly known as: NOVOLIN R Inject 1-10 Units into the skin 3 (three) times daily before meals. Per sliding scale   oxyCODONE-acetaminophen 5-325 MG tablet Commonly known as: PERCOCET/ROXICET Take 1 tablet by mouth every 6 (six) hours as needed for moderate pain.   sulfamethoxazole-trimethoprim 800-160 MG tablet Commonly known as: BACTRIM DS Take 1 tablet by mouth 2 (two) times daily.       Vitals:   07/18/19 1958 07/19/19 0619  BP: 133/87 108/64  Pulse: 67 76  Resp: 18 16  Temp: 98.2 F (36.8 C) 98 F (36.7 C)  SpO2: 100% 99%    Skin clean, dry and intact without evidence of skin break down, no evidence of skin tears noted. IV catheter discontinued intact. Site without signs and symptoms of complications. Dressing and pressure applied. Pt denies pain at this time. No complaints noted.  An After Visit Summary was printed and given to the patient. Patient escorted via [place], and D/C home via private auto.  Chuck Hint RN [facility] [employer]

## 2019-07-19 NOTE — Discharge Instructions (Signed)
It was so nice to meet you during this hospitalization!  You came into the hospital with an abscess of your genital area. You had surgery done on 10/5 to help drain this abscess.  I have prescribed an antibiotic called bactrim for you to go home with. Please take 1 tablet twice a day for 2 weeks.  Please make sure you follow-up with the urologist (Dr. Bernardo Heater) in clinic in 1 week.  Take care, Dr. Brett Albino

## 2019-07-19 NOTE — Progress Notes (Signed)
Results for GRADIE, [facility] (MRN [id number]) as of 07/18/2019 10:55  Ref. Range 10/17/2018 20:22 07/18/2019 05:47  Hemoglobin A1C Latest Ref Range: 4.8 - 5.6 % 12.3 (H) 10.9 (H)  (266 mg/dl)    Admit with: Left inguinal abscess with extension into the perineum and scrotum   History: Type 1 Diabetes  Home DM Meds: 70/30 Insulin 40 units AM                             Regular Insulin 1-10 units TID per SSI  Current Orders: Lantus 10 units QHS                           Novolog Sensitive Correction Scale/ SSI (0-9 units) Q4 hours      No Lantus given last PM and pt had mild Hypoglycemic event this AM.  Planning for d/c home today.  CBG 171 mg/dl at 12pm today.  Met w/ pt prior to d/c home today to discuss home DM medication regimen.  Reviewed current A1c of 10.9% with pt as well.  Pt told me he was taking Lantus and Novolog a while ago but it got to the point where he could not afford it.  Sees the MD at the [facility] but told me he doesn't qualify for medication assistance through the medication management clinic b/c he makes too much money at his job.  Switched over to 70/30 Insulin and Regular insulin SSI that he gets at [facility] for $25 per vial.    Discussed with pt that his A1c of 10.9% suggests poor glucose control at home.  Pt told me he checks his CBGs but does not usually see such elevated CBGs.  Discussed with pt the importance of good CBG control to help heal his groin, to prevent further infections, and to help keep his organs healthy for the future.  Also discussed with pt that 70/30 Insulin is usually given BID at home and that is unusual for him to be only taking it once per day.  Pt told me he struggled with HYPO at home and decided to reduce the Insulin to once day and use the Regular SSi to coverhis other needs.  Reminded pt that since he has Type 1 Diabetes he needs coverage throughout the day and that taking 70/30 once per day is not matching his  physiological needs.  Pt stated he plans to follow up with the [facility] and will discuss changing his dose to BID.  Discussed with pt that he did not get any Lantus last PM.  Pt stated he will check his CBG when he goes home.  I asked pt to consider taking a portion of his 70/30 Insulin with lunch when he gets home (maybe 20 units [50%]) to prevent his CBGs from climbing and risk putting him into DKA.  Pt agreeable and states he will take insulin when he goes home.     --Will follow patient during hospitalization--  Wyn Quaker RN, MSN, CDE Diabetes Coordinator Inpatient Glycemic Control Team Team Pager: [phone] (8a-5p)

## 2019-07-19 NOTE — Progress Notes (Signed)
Urology Inpatient Progress Note  Subjective: Wesley [name] is a 23 y.o. male who is POD1 from OR I&D of a left scrotal abscess.  Wound culture preliminary results with abundant WBCs, abundant Gram-positive cocci, and few Gram-negative rods.  WBC count 6.9 today, down from 7.8 yesterday.  Pain well controlled, patient states he feels better today.  Anti-infectives: Anti-infectives (From admission, onward)   Start     Dose/Rate Route Frequency Ordered Stop   07/18/19 1500  vancomycin (VANCOCIN) 1,750 mg in sodium chloride 0.9 % 500 mL IVPB     1,750 mg 250 mL/hr over 120 Minutes Intravenous Every 12 hours 07/18/19 0315     07/18/19 0230  vancomycin (VANCOCIN) 2,000 mg in sodium chloride 0.9 % 500 mL IVPB     2,000 mg 250 mL/hr over 120 Minutes Intravenous  Once 07/18/19 0145 07/18/19 0611   07/18/19 0145  piperacillin-tazobactam (ZOSYN) IVPB 3.375 g     3.375 g 100 mL/hr over 30 Minutes Intravenous  Once 07/18/19 0143 07/18/19 0215      Current Facility-Administered Medications  Medication Dose Route Frequency Provider Last Rate Last Dose  . 0.9 %  sodium chloride infusion   Intravenous PRN Mayo, Allyn Kenner, MD   Stopped at 07/19/19 0406  . acetaminophen (TYLENOL) tablet 650 mg  650 mg Oral Q6H PRN Stoioff, Scott C, MD       Or  . acetaminophen (TYLENOL) suppository 650 mg  650 mg Rectal Q6H PRN Stoioff, Scott C, MD      . docusate sodium (COLACE) capsule 100 mg  100 mg Oral BID Stoioff, Scott C, MD   100 mg at 07/19/19 0810  . insulin aspart (novoLOG) injection 0-9 Units  0-9 Units Subcutaneous Q4H Riki Altes, MD   2 Units at 07/19/19 0810  . insulin glargine (LANTUS) injection 10 Units  10 Units Subcutaneous QHS Riki Altes, MD   10 Units at 07/18/19 0645  . morphine 4 MG/ML injection 4 mg  4 mg Intravenous Q4H PRN Stoioff, Scott C, MD   4 mg at 07/19/19 0810  . ondansetron (ZOFRAN) tablet 4 mg  4 mg Oral Q6H PRN Stoioff, Scott C, MD       Or  . ondansetron (ZOFRAN)  injection 4 mg  4 mg Intravenous Q6H PRN Stoioff, Scott C, MD      . oxyCODONE-acetaminophen (PERCOCET/ROXICET) 5-325 MG per tablet 1 tablet  1 tablet Oral Q6H PRN Riki Altes, MD   1 tablet at 07/19/19 0410  . vancomycin (VANCOCIN) 1,750 mg in sodium chloride 0.9 % 500 mL IVPB  1,750 mg Intravenous Q12H Stoioff, Scott C, MD 250 mL/hr at 07/19/19 0500       Objective: Vital signs in last 24 hours: Temp:  [97.2 F (36.2 C)-98.9 F (37.2 C)] 98 F (36.7 C) (10/06 4431) Pulse Rate:  [60-81] 76 (10/06 0619) Resp:  [10-20] 16 (10/06 0619) BP: (107-133)/(61-87) 108/64 (10/06 0619) SpO2:  [99 %-100 %] 99 % (10/06 0619) Weight:  [79.4 kg-84.3 kg] 84.3 kg (10/06 0500)  Intake/Output from previous day: 10/05 0701 - 10/06 0700 In: 1766.2 [P.O.:600; I.V.:450.3; IV Piggyback:715.9] Out: 450 [Urine:450] Intake/Output this shift: Total I/O In: 240 [P.O.:240] Out: -   Physical Exam Vitals signs and nursing note reviewed.  Constitutional:      General: He is not in acute distress.    Appearance: He is not ill-appearing, toxic-appearing or diaphoretic.  HENT:     Head: Normocephalic and atraumatic.  Pulmonary:  Effort: Pulmonary effort is normal. No respiratory distress.  Abdominal:     General: Abdomen is flat. There is no distension.  Genitourinary:    Comments: Surgical incision of the left inguinal crease approximately 3 x 2 cm in dimension.  Iodoform packing in place, wound producing serosanguineous discharge.  Wound bed clean and red with minimal drainage.  Adjacent areas of induration remain. Skin:    General: Skin is warm and dry.  Neurological:     Mental Status: He is alert and oriented to person, place, and time.  Psychiatric:        Mood and Affect: Mood normal.        Behavior: Behavior normal.    Lab Results:  Recent Labs    07/17/19 2214 07/19/19 0442  WBC 7.8 6.9  HGB 13.8 12.1*  HCT 42.1 36.7*  PLT 255 197   BMET Recent Labs    07/17/19 2214  07/19/19 0442  NA 139 137  K 3.8 3.7  CL 100 104  CO2 28 27  GLUCOSE 163* 78  BUN 14 9  CREATININE 0.73 0.73  CALCIUM 9.4 8.6*   Assessment & Plan: Patient doing well 1 day after I&D in the operating room for left inguinal abscess with extension into the left scrotum.  I performed a dressing change on his wound today, removing a single length of iodoform packing, irrigating the open wound, and placing a new 4 x 4 wet-to-dry gauze pad inside the wound.  Okay to discharge today, patient feels comfortable packing his wound himself on an outpatient basis.  Recommend discharge on p.o. antibiotics with follow-up in clinic in 1 week.  Debroah Loop, PA-C 07/19/2019

## 2019-07-19 NOTE — Discharge Summary (Signed)
[facility] [place] NAME: Dagan Heinz    MR#:  [id number]  DATE OF BIRTH:  Apr 27, 1996  DATE OF ADMISSION:  07/18/2019   ADMITTING PHYSICIAN: Harrie Foreman, MD  DATE OF DISCHARGE: 07/19/19  PRIMARY CARE PHYSICIAN: [facility] [place]   ADMISSION DIAGNOSIS:  Scrotal abscess [N49.2] Inguinal abscess [L02.214] DISCHARGE DIAGNOSIS:  Active Problems:   Cellulitis of scrotum  SECONDARY DIAGNOSIS:   Past Medical History:  Diagnosis Date  . Diabetes mellitus without complication ([place])    type 1 diabetes   HOSPITAL COURSE:   Kishawn is a 23 year old male who presented to the ED with progressively worsening swelling and pain in his groin area.  In the ED, CT pelvis showed a left inguinal abscess with extension into the perineum and scrotum.  He was admitted for further management.  Abscess of the left inguinal region with scrotal cellulitis- no signs of sepsis this admission. -S/p I&D on 10/5 -Surgical culture was pending at the time of discharge, but gram stain grew abundant gram-positive cocci -Patient initially placed on empiric vancomycin and Zosyn and then transitioned to Bactrim for 2 weeks on discharge -Patient will need to follow-up with urology in 1 week  Uncontrolled type 1 diabetes- A1c 10.7% this admission -Home diabetes medicines were continued on discharge  DISCHARGE CONDITIONS:  Abscess of the left inguinal region with scrotal cellulitis s/p I&D Uncontrolled type 1 diabetes CONSULTS OBTAINED:  Treatment Team:  Hollice Espy, MD DRUG ALLERGIES:  No Known Allergies DISCHARGE MEDICATIONS:   Allergies as of 07/19/2019   No Known Allergies     Medication List    STOP taking these medications   HYDROcodone-acetaminophen 5-325 MG tablet Commonly known as: Norco   ondansetron 4 MG disintegrating tablet Commonly known as: Zofran ODT     TAKE these medications   blood glucose meter kit  and supplies Kit Dispense based on patient and insurance preference. Use up to four times daily as directed. (FOR ICD-9 250.00, 250.01).   insulin NPH-regular Human (70-30) 100 UNIT/ML injection Inject 40 Units into the skin every morning.   insulin regular 100 units/mL injection Commonly known as: NOVOLIN R Inject 1-10 Units into the skin 3 (three) times daily before meals. Per sliding scale   oxyCODONE-acetaminophen 5-325 MG tablet Commonly known as: PERCOCET/ROXICET Take 1 tablet by mouth every 6 (six) hours as needed for moderate pain.   sulfamethoxazole-trimethoprim 800-160 MG tablet Commonly known as: BACTRIM DS Take 1 tablet by mouth 2 (two) times daily.        DISCHARGE INSTRUCTIONS:  1.  Follow-up with PCP in 5 days 2.  Follow-up with urology in 1 week 3.  Take Bactrim twice daily for 14 days DIET:  Diabetic diet DISCHARGE CONDITION:  Stable ACTIVITY:  Activity as tolerated OXYGEN:  Home Oxygen: No.  Oxygen Delivery: room air DISCHARGE LOCATION:  home   If you experience worsening of your admission symptoms, develop shortness of breath, life threatening emergency, suicidal or homicidal thoughts you must seek medical attention immediately by calling 911 or calling your MD immediately  if symptoms less severe.  You Must read complete instructions/literature along with all the possible adverse reactions/side effects for all the Medicines you take and that have been prescribed to you. Take any new Medicines after you have completely understood and accpet all the possible adverse reactions/side effects.   Please note  You were cared for by a hospitalist during your hospital stay. If  you have any questions about your discharge medications or the care you received while you were in the hospital after you are discharged, you can call the unit and asked to speak with the hospitalist on call if the hospitalist that took care of you is not available. Once you are discharged,  your primary care physician will handle any further medical issues. Please note that NO REFILLS for any discharge medications will be authorized once you are discharged, as it is imperative that you return to your primary care physician (or establish a relationship with a primary care physician if you do not have one) for your aftercare needs so that they can reassess your need for medications and monitor your lab values.    On the day of Discharge:  VITAL SIGNS:  Blood pressure 108/64, pulse 76, temperature 98 F (36.7 C), temperature source Oral, resp. rate 16, height '5\' 11"'$  (1.803 m), weight 84.3 kg, SpO2 99 %. PHYSICAL EXAMINATION:  GENERAL:  23 y.o.-year-old patient lying in the bed with no acute distress.  EYES: Pupils equal, round, reactive to light and accommodation. No scleral icterus. Extraocular muscles intact.  HEENT: Head atraumatic, normocephalic. Oropharynx and nasopharynx clear.  NECK:  Supple, no jugular venous distention. No thyroid enlargement, no tenderness.  LUNGS: Normal breath sounds bilaterally, no wheezing, rales,rhonchi or crepitation. No use of accessory muscles of respiration.  CARDIOVASCULAR: RRR, S1, S2 normal. No murmurs, rubs, or gallops.  ABDOMEN: Soft, non-tender, non-distended. Bowel sounds present. No organomegaly or mass.  GENITOURINARY: Dry bandage in place over surgical site EXTREMITIES: No pedal edema, cyanosis, or clubbing.  NEUROLOGIC: Cranial nerves II through XII are intact. Muscle strength 5/5 in all extremities. Sensation intact. Gait not checked.  PSYCHIATRIC: The patient is alert and oriented x 3.  SKIN: No obvious rash, lesion, or ulcer.  DATA REVIEW:   CBC Recent Labs  Lab 07/19/19 0442  WBC 6.9  HGB 12.1*  HCT 36.7*  PLT 197    Chemistries  Recent Labs  Lab 07/19/19 0442  NA 137  K 3.7  CL 104  CO2 27  GLUCOSE 78  BUN 9  CREATININE 0.73  CALCIUM 8.6*     Microbiology Results  Results for orders placed or performed  during the hospital encounter of 07/18/19  SARS Coronavirus 2 [facility] order, Performed in [facility] hospital lab) Nasopharyngeal Nasopharyngeal Swab     Status: None   Collection Time: 07/18/19  2:10 AM   Specimen: Nasopharyngeal Swab  Result Value Ref Range Status   SARS Coronavirus 2 NEGATIVE NEGATIVE Final    Comment: (NOTE) If result is NEGATIVE SARS-CoV-2 target nucleic acids are NOT DETECTED. The SARS-CoV-2 RNA is generally detectable in upper and lower  respiratory specimens during the acute phase of infection. The lowest  concentration of SARS-CoV-2 viral copies this assay can detect is 250  copies / mL. A negative result does not preclude SARS-CoV-2 infection  and should not be used as the sole basis for treatment or other  patient management decisions.  A negative result may occur with  improper specimen collection / handling, submission of specimen other  than nasopharyngeal swab, presence of viral mutation(s) within the  areas targeted by this assay, and inadequate number of viral copies  (<250 copies / mL). A negative result must be combined with clinical  observations, patient history, and epidemiological information. If result is POSITIVE SARS-CoV-2 target nucleic acids are DETECTED. The SARS-CoV-2 RNA is generally detectable in upper and lower  respiratory specimens  dur ing the acute phase of infection.  Positive  results are indicative of active infection with SARS-CoV-2.  Clinical  correlation with patient history and other diagnostic information is  necessary to determine patient infection status.  Positive results do  not rule out bacterial infection or co-infection with other viruses. If result is PRESUMPTIVE POSTIVE SARS-CoV-2 nucleic acids MAY BE PRESENT.   A presumptive positive result was obtained on the submitted specimen  and confirmed on repeat testing.  While 2019 novel coronavirus  (SARS-CoV-2) nucleic acids may be present in the submitted sample   additional confirmatory testing may be necessary for epidemiological  and / or clinical management purposes  to differentiate between  SARS-CoV-2 and other Sarbecovirus currently known to infect humans.  If clinically indicated additional testing with an alternate test  methodology [phone]) is advised. The SARS-CoV-2 RNA is generally  detectable in upper and lower respiratory sp ecimens during the acute  phase of infection. The expected result is Negative. Fact Sheet for Patients:  StrictlyIdeas.no Fact Sheet for Healthcare Providers: BankingDealers.co.za This test is not yet approved or cleared by the [place] FDA and has been authorized for detection and/or diagnosis of SARS-CoV-2 by FDA under an Emergency Use Authorization (EUA).  This EUA will remain in effect (meaning this test can be used) for the duration of the COVID-19 declaration under Section 564(b)(1) of the Act, 21 U.S.C. section 360bbb-3(b)(1), unless the authorization is terminated or revoked sooner. Performed at [facility], [address] [place], [place] [address]   MRSA PCR Screening     Status: None   Collection Time: 07/18/19  7:55 AM   Specimen: Nasal Mucosa; Nasopharyngeal  Result Value Ref Range Status   MRSA by PCR NEGATIVE NEGATIVE Final    Comment:        The GeneXpert MRSA Assay (FDA approved for NASAL specimens only), is one component of a comprehensive MRSA colonization surveillance program. It is not intended to diagnose MRSA infection nor to guide or monitor treatment for MRSA infections. Performed at [facility], [address] [place], [place] [address]   Aerobic/Anaerobic Culture (surgical/deep wound)     Status: None (Preliminary result)   Collection Time: 07/18/19 12:48 PM   Specimen: PATH Other; Tissue  Result Value Ref Range Status   Specimen Description   Final    ABSCESS Performed at [facility],  [address] [place], [place] [address]    Special Requests   Final    SCROTAL ABSCESS Performed at [facility], [address] [place], [place] [address]    Gram Stain   Final    ABUNDANT WBC PRESENT, PREDOMINANTLY PMN ABUNDANT GRAM POSITIVE COCCI FEW GRAM NEGATIVE RODS Performed at [facility], [place] [address] [place], [place] [address]    Culture PENDING  Incomplete   Report Status PENDING  Incomplete    RADIOLOGY:  No results found.   Management plans discussed with the patient, family and they are in agreement.  CODE STATUS: Full Code   TOTAL TIME TAKING CARE OF THIS PATIENT: 45 minutes.    Berna Spare Maly Lemarr M.D on 07/19/2019 at 10:04 AM  Between 7am to 6pm - Pager - [phone]  After 6pm go to [email or web address] - Technical brewer [facility]  Office  [phone]  CC: Primary care physician; [facility] [place]   Note: This dictation was prepared with Diplomatic Services operational officer dictation along with smaller phrase technology. Any transcriptional errors  that result from this process are unintentional.

## 2019-07-23 LAB — AEROBIC/ANAEROBIC CULTURE W GRAM STAIN (SURGICAL/DEEP WOUND)

## 2019-07-27 ENCOUNTER — Other Ambulatory Visit: Payer: Self-pay

## 2019-07-27 ENCOUNTER — Ambulatory Visit (INDEPENDENT_AMBULATORY_CARE_PROVIDER_SITE_OTHER): Payer: BC Managed Care – PPO | Admitting: Urology

## 2019-07-27 ENCOUNTER — Encounter: Payer: Self-pay | Admitting: Urology

## 2019-07-27 VITALS — BP 112/76 | HR 116 | Ht 69.0 in | Wt 166.7 lb

## 2019-07-27 DIAGNOSIS — N492 Inflammatory disorders of scrotum: Secondary | ICD-10-CM | POA: Diagnosis not present

## 2019-07-27 NOTE — Progress Notes (Signed)
07/27/2019 1:25 PM   Wesley [name] 09-Jan-1996 [id number]  Referring provider: [facility], [place],  [place] [address]  Chief Complaint  Patient presents with  . Groin Swelling    HPI: 23 y.o. male presents for hospital follow-up.  He underwent I&D of left hemiscrotal/groin abscess on 07/18/2019.  Wound culture grew anaerobes.  He is currently doing dressing changes daily.  Other than slight itching he has no complaints.  Denies fever or chills.   PMH: Past Medical History:  Diagnosis Date  . Diabetes mellitus without complication ([place])    type 1 diabetes    Surgical History: Past Surgical History:  Procedure Laterality Date  . HAND SURGERY    . INCISION AND DRAINAGE ABSCESS N/A 10/02/2015   Procedure: INCISION AND DRAINAGE ABSCESS;  Surgeon: Festus Aloe, MD;  Location: [facility] ORS;  Service: Urology;  Laterality: N/A;  . INCISION AND DRAINAGE ABSCESS N/A 07/18/2019   Procedure: INCISION AND DRAINAGE SCROTAL ABSCESS;  Surgeon: Abbie Sons, MD;  Location: [facility] ORS;  Service: Urology;  Laterality: N/A;  . KNEE ARTHROSCOPY WITH MEDIAL MENISECTOMY Right 03/17/2019   Procedure: KNEE ARTHROSCOPY WITH MEDIAL MENISECTOMY, POSSIBLE CHONDROPLASTY RIGHT - DIABETIC;  Surgeon: Leim Fabry, MD;  Location: [facility] ORS;  Service: Orthopedics;  Laterality: Right;  . TONSILLECTOMY    . TONSILLECTOMY AND ADENOIDECTOMY    . UMBILICAL HERNIA REPAIR      Home Medications:  Allergies as of 07/27/2019   No Known Allergies     Medication List       Accurate as of July 27, 2019  1:25 PM. If you have any questions, ask your nurse or doctor.        blood glucose meter kit and supplies Kit Dispense based on patient and insurance preference. Use up to four times daily as directed. (FOR ICD-9 250.00, 250.01).   insulin NPH-regular Human (70-30) 100 UNIT/ML injection Inject 40 Units into the skin every morning.   insulin regular 100  units/mL injection Commonly known as: NOVOLIN R Inject 1-10 Units into the skin 3 (three) times daily before meals. Per sliding scale   oxyCODONE-acetaminophen 5-325 MG tablet Commonly known as: PERCOCET/ROXICET Take 1 tablet by mouth every 6 (six) hours as needed for moderate pain.   sulfamethoxazole-trimethoprim 800-160 MG tablet Commonly known as: BACTRIM DS Take 1 tablet by mouth 2 (two) times daily.       Allergies: No Known Allergies  Family History: Family History  Problem Relation Age of Onset  . Diabetes Mellitus II Paternal Grandfather   . Hypertension Mother     Social History:  reports that he has quit smoking. His smoking use included cigarettes. He has a 2.00 pack-year smoking history. He has never used smokeless tobacco. He reports current alcohol use. He reports current drug use. Drug: Marijuana.  ROS: UROLOGY Frequent Urination?: No Hard to postpone urination?: No Burning/pain with urination?: No Get up at night to urinate?: No Leakage of urine?: No Urine stream starts and stops?: No Trouble starting stream?: No Do you have to strain to urinate?: No Blood in urine?: No Urinary tract infection?: No Sexually transmitted disease?: No Injury to kidneys or bladder?: No Painful intercourse?: No Weak stream?: No Erection problems?: No Penile pain?: No  Gastrointestinal Nausea?: No Vomiting?: No Indigestion/heartburn?: No Diarrhea?: No Constipation?: No  Constitutional Fever: No Night sweats?: No Weight loss?: No Fatigue?: No  Skin Skin rash/lesions?: No Itching?: No  Eyes Blurred vision?: No Double vision?:  No  Ears/Nose/Throat Sore throat?: No Sinus problems?: No  Hematologic/Lymphatic Swollen glands?: No Easy bruising?: No  Cardiovascular Leg swelling?: No Chest pain?: No  Respiratory Cough?: No Shortness of breath?: No  Endocrine Excessive thirst?: No  Musculoskeletal Back pain?: No Joint pain?: No  Neurological  Headaches?: No Dizziness?: No  Psychologic Depression?: No Anxiety?: No  Physical Exam: BP 112/76 (BP Location: Left Arm, Patient Position: Sitting, Cuff Size: Normal)   Pulse (!) 116   Ht _0  (1.753 m)   Wt 166 lb 11.2 oz (75.6 kg)   BMI 24.62 kg/m   Constitutional:  Alert and oriented, No acute distress. HEENT: [place] AT, moist mucus membranes.  Trachea midline, no masses. Cardiovascular: No clubbing, cyanosis, or edema. Respiratory: Normal respiratory effort, no increased work of breathing. GI: Abdomen is soft, nontender, nondistended, no abdominal masses GU: Testes descended bilateral without masses or tenderness.  No scrotal swelling, erythema or skin changes.  I&D site granulating nicely.  Assessment & Plan:   Doing well status post I&D of the left hemiscrotal/groin abscess.  Continue daily dressing changes and follow-up with [facility] 6 weeks for final recheck.   Abbie Sons, [place] [address] [place], [place] [address] [phone]

## 2019-08-23 ENCOUNTER — Inpatient Hospital Stay
Admission: EM | Admit: 2019-08-23 | Discharge: 2019-08-26 | DRG: 638 | Disposition: A | Discharge status: Home / Self Care | Payer: BC Managed Care – PPO | Attending: Internal Medicine | Admitting: Internal Medicine

## 2019-08-23 ENCOUNTER — Encounter: Payer: Self-pay | Admitting: Emergency Medicine

## 2019-08-23 ENCOUNTER — Other Ambulatory Visit: Payer: Self-pay

## 2019-08-23 DIAGNOSIS — E1010 Type 1 diabetes mellitus with ketoacidosis without coma: Secondary | ICD-10-CM | POA: Diagnosis present

## 2019-08-23 DIAGNOSIS — Z87891 Personal history of nicotine dependence: Secondary | ICD-10-CM

## 2019-08-23 DIAGNOSIS — Z794 Long term (current) use of insulin: Secondary | ICD-10-CM

## 2019-08-23 DIAGNOSIS — Z20828 Contact with and (suspected) exposure to other viral communicable diseases: Secondary | ICD-10-CM | POA: Diagnosis present

## 2019-08-23 DIAGNOSIS — A084 Viral intestinal infection, unspecified: Secondary | ICD-10-CM | POA: Diagnosis present

## 2019-08-23 DIAGNOSIS — N179 Acute kidney failure, unspecified: Secondary | ICD-10-CM | POA: Diagnosis present

## 2019-08-23 DIAGNOSIS — Z833 Family history of diabetes mellitus: Secondary | ICD-10-CM

## 2019-08-23 DIAGNOSIS — D72829 Elevated white blood cell count, unspecified: Secondary | ICD-10-CM | POA: Diagnosis not present

## 2019-08-23 DIAGNOSIS — R112 Nausea with vomiting, unspecified: Secondary | ICD-10-CM

## 2019-08-23 LAB — URINALYSIS, COMPLETE (UACMP) WITH MICROSCOPIC
Bacteria, UA: NONE SEEN
Bilirubin Urine: NEGATIVE
Glucose, UA: >=500 mg/dL — AB
Hgb urine dipstick: NEGATIVE
Ketones, ur: 80 mg/dL — AB
Leukocytes,Ua: NEGATIVE
Nitrite: NEGATIVE
Protein, ur: 30 mg/dL — AB
Specific Gravity, Urine: 1.025 (ref 1.005–1.030)
pH: 5 (ref 5.0–8.0)

## 2019-08-23 LAB — BLOOD GAS, VENOUS
Acid-base deficit: 11.9 mmol/L — ABNORMAL HIGH (ref 0.0–2.0)
Bicarbonate: 12.8 mmol/L — ABNORMAL LOW (ref 20.0–28.0)
O2 Saturation: 82.2 %
Patient temperature: 37
pCO2, Ven: 26 mmHg — ABNORMAL LOW (ref 44.0–60.0)
pH, Ven: 7.3 (ref 7.250–7.430)
pO2, Ven: 52 mmHg — ABNORMAL HIGH (ref 32.0–45.0)

## 2019-08-23 LAB — GLUCOSE, CAPILLARY
Glucose-Capillary: 291 mg/dL — ABNORMAL HIGH (ref 70–99)
Glucose-Capillary: 107 mg/dL — ABNORMAL HIGH (ref 70–99)
Glucose-Capillary: 82 mg/dL (ref 70–99)

## 2019-08-23 LAB — COMPREHENSIVE METABOLIC PANEL
ALT: 32 U/L (ref 0–44)
AST: 35 U/L (ref 15–41)
Albumin: 4.6 g/dL (ref 3.5–5.0)
Alkaline Phosphatase: 104 U/L (ref 38–126)
Anion gap: 18 — ABNORMAL HIGH (ref 5–15)
BUN: 20 mg/dL (ref 6–20)
CO2: 15 mmol/L — ABNORMAL LOW (ref 22–32)
Calcium: 9.6 mg/dL (ref 8.9–10.3)
Chloride: 108 mmol/L (ref 98–111)
Creatinine, Ser: 1.28 mg/dL — ABNORMAL HIGH (ref 0.61–1.24)
GFR calc Af Amer: >60 mL/min (ref >60)
GFR calc non Af Amer: >60 mL/min (ref >60)
Glucose, Bld: 257 mg/dL — ABNORMAL HIGH (ref 70–99)
Potassium: 5.2 mmol/L — ABNORMAL HIGH (ref 3.5–5.1)
Sodium: 141 mmol/L (ref 135–145)
Total Bilirubin: 1.4 mg/dL — ABNORMAL HIGH (ref 0.3–1.2)
Total Protein: 7.4 g/dL (ref 6.5–8.1)

## 2019-08-23 LAB — CBC
HCT: 50.2 % (ref 39.0–52.0)
Hemoglobin: 16.5 g/dL (ref 13.0–17.0)
MCH: 29.5 pg (ref 26.0–34.0)
MCHC: 32.9 g/dL (ref 30.0–36.0)
MCV: 89.8 fL (ref 80.0–100.0)
Platelets: 287 10*3/uL (ref 150–400)
RBC: 5.59 MIL/uL (ref 4.22–5.81)
RDW: 13.5 % (ref 11.5–15.5)
WBC: 15.9 10*3/uL — ABNORMAL HIGH (ref 4.0–10.5)
nRBC: 0 % (ref 0.0–0.2)

## 2019-08-23 LAB — BETA-HYDROXYBUTYRIC ACID: Beta-Hydroxybutyric Acid: 7.03 mmol/L — ABNORMAL HIGH (ref 0.05–0.27)

## 2019-08-23 LAB — TROPONIN I (HIGH SENSITIVITY): Troponin I (High Sensitivity): 3 ng/L (ref <18)

## 2019-08-23 MED ORDER — OXYCODONE-ACETAMINOPHEN 5-325 MG PO TABS: 1.0000 | ORAL_TABLET | Freq: Four times a day (QID) | ORAL | Status: DC | PRN

## 2019-08-23 MED ORDER — SODIUM CHLORIDE 0.9 % IV SOLN: INTRAVENOUS | Status: DC

## 2019-08-23 MED ORDER — ENOXAPARIN SODIUM 40 MG/0.4ML ~~LOC~~ SOLN
40.0000 mg | SUBCUTANEOUS | Status: DC
  Administered 2019-08-24 – 2019-08-26 (×3): 40 mg via SUBCUTANEOUS
  Filled 2019-08-23 (×3): qty 0.4

## 2019-08-23 MED ORDER — ALUM & MAG HYDROXIDE-SIMETH 200-200-20 MG/5ML PO SUSP: 30.0000 mL | ORAL | Status: DC | PRN

## 2019-08-23 MED ORDER — SODIUM CHLORIDE 0.9 % IV BOLUS
1000.0000 mL | Freq: Once | INTRAVENOUS | Status: AC
  Administered 2019-08-23: 1000 mL via INTRAVENOUS

## 2019-08-23 MED ORDER — METOCLOPRAMIDE HCL 5 MG/ML IJ SOLN
10.0000 mg | Freq: Four times a day (QID) | INTRAMUSCULAR | Status: DC
  Administered 2019-08-24 – 2019-08-26 (×11): 10 mg via INTRAVENOUS
  Filled 2019-08-23 (×11): qty 2

## 2019-08-23 MED ORDER — INSULIN REGULAR(HUMAN) IN NACL 100-0.9 UT/100ML-% IV SOLN
INTRAVENOUS | Status: DC
  Administered 2019-08-23: 22:00:00 0.5 [IU]/h via INTRAVENOUS
  Filled 2019-08-23: qty 100

## 2019-08-23 MED ORDER — SODIUM CHLORIDE 0.9 % IV SOLN
Freq: Once | INTRAVENOUS | Status: AC
  Administered 2019-08-23: 17:00:00 via INTRAVENOUS

## 2019-08-23 MED ORDER — SODIUM CHLORIDE 0.9% FLUSH: 3.0000 mL | Freq: Once | INTRAVENOUS | Status: DC

## 2019-08-23 MED ORDER — ACETAMINOPHEN 325 MG PO TABS: 650.0000 mg | ORAL_TABLET | ORAL | Status: DC | PRN

## 2019-08-23 MED ORDER — SODIUM CHLORIDE 0.9 % IV SOLN: Freq: Once | INTRAVENOUS | Status: DC

## 2019-08-23 MED ORDER — DEXTROSE-NACL 5-0.45 % IV SOLN
INTRAVENOUS | Status: DC
  Administered 2019-08-23: 1000 mL via INTRAVENOUS

## 2019-08-23 MED ORDER — ONDANSETRON HCL 4 MG/2ML IJ SOLN
4.0000 mg | Freq: Once | INTRAMUSCULAR | Status: AC | PRN
  Administered 2019-08-23: 17:00:00 4 mg via INTRAVENOUS
  Filled 2019-08-23: qty 2

## 2019-08-23 MED ORDER — INSULIN REGULAR(HUMAN) IN NACL 100-0.9 UT/100ML-% IV SOLN: INTRAVENOUS | Status: DC

## 2019-08-23 MED ORDER — PROMETHAZINE HCL 25 MG/ML IJ SOLN
25.0000 mg | Freq: Once | INTRAMUSCULAR | Status: AC
  Administered 2019-08-23: 19:00:00 25 mg via INTRAVENOUS
  Filled 2019-08-23: qty 1

## 2019-08-23 MED ORDER — MAGNESIUM HYDROXIDE 400 MG/5ML PO SUSP
30.0000 mL | Freq: Every day | ORAL | Status: DC | PRN
  Filled 2019-08-23: qty 30

## 2019-08-23 MED ORDER — ONDANSETRON HCL 4 MG/2ML IJ SOLN
4.0000 mg | INTRAMUSCULAR | Status: DC | PRN
  Administered 2019-08-24 (×5): 4 mg via INTRAVENOUS
  Filled 2019-08-23 (×5): qty 2

## 2019-08-23 MED ORDER — PROMETHAZINE HCL 25 MG/ML IJ SOLN: 12.5000 mg | INTRAMUSCULAR | Status: DC | PRN

## 2019-08-23 NOTE — ED Notes (Signed)
ED TO INPATIENT HANDOFF REPORT  ED Nurse Name and Phone #:  Madelon LipsJen  S Name/Age/Gender Wesley [name] 23 y.o. male Room/Bed: ED15A/ED15A  Code Status   Code Status: Full Code  Home/SNF/Other Home Patient oriented to: self, place, time and situation Is this baseline? Yes   Triage Complete: Triage complete  Chief Complaint nausea vomiting hx dka  Triage Note C/O vomiting since 0400.  Arrives stating "I think I'm in DKA".  AAOx3.  Skin warm and dry. NAD   Allergies No Known Allergies  Level of Care/Admitting Diagnosis ED Disposition    ED Disposition Condition Comment   Admit  Hospital [facility] [[id number]]  Level of Care: Stepdown [14]  Covid Evaluation: Asymptomatic Screening Protocol (No Symptoms)  Diagnosis: DKA, type 1 [facility](HCC) [[id number]]  Admitting Physician: Hannah BeatMANSY, JAN A [[id number]]  Attending Physician: Hannah BeatMANSY, JAN A [[id number]]  Estimated length of stay: past midnight tomorrow  Certification:: I certify this patient will need inpatient services for at least 2 midnights  PT Class (Do Not Modify): Inpatient [101]  PT Acc Code (Do Not Modify): Private [1]       B Medical/Surgery History Past Medical History:  Diagnosis Date  . Diabetes mellitus without complication (HCC)    type 1 diabetes   Past Surgical History:  Procedure Laterality Date  . HAND SURGERY    . INCISION AND DRAINAGE ABSCESS N/A 10/02/2015   Procedure: INCISION AND DRAINAGE ABSCESS;  Surgeon: Jerilee FieldMatthew Eskridge, MD;  Location: [facility] ORS;  Service: Urology;  Laterality: N/A;  . INCISION AND DRAINAGE ABSCESS N/A 07/18/2019   Procedure: INCISION AND DRAINAGE SCROTAL ABSCESS;  Surgeon: Riki AltesStoioff, Scott C, MD;  Location: [facility] ORS;  Service: Urology;  Laterality: N/A;  . KNEE ARTHROSCOPY WITH MEDIAL MENISECTOMY Right 03/17/2019   Procedure: KNEE ARTHROSCOPY WITH MEDIAL MENISECTOMY, POSSIBLE CHONDROPLASTY RIGHT - DIABETIC;  Surgeon: Signa KellPatel, Sunny, MD;  Location: [facility] ORS;  Service:  Orthopedics;  Laterality: Right;  . TONSILLECTOMY    . TONSILLECTOMY AND ADENOIDECTOMY    . UMBILICAL HERNIA REPAIR       A IV Location/Drains/Wounds Patient Lines/Drains/Airways Status   Active Line/Drains/Airways    Name:   Placement date:   Placement time:   Site:   Days:   Peripheral IV 08/23/19 Right Hand   08/23/19    1658    Hand   less than 1   Incision (Closed) 07/18/19 Perineum Other (Comment)   07/18/19    1259     36          Intake/Output Last 24 hours No intake or output data in the 24 hours ending 08/23/19 2322  Labs/Imaging Results for orders placed or performed during the hospital encounter of 08/23/19 (from the past 48 hour(s))  CBC     Status: Abnormal   Collection Time: 08/23/19  4:48 PM  Result Value Ref Range   WBC 15.9 (H) 4.0 - 10.5 K/uL   RBC 5.59 4.22 - 5.81 MIL/uL   Hemoglobin 16.5 13.0 - 17.0 g/dL   HCT 27.250.2 53.639.0 - 64.452.0 %   MCV 89.8 80.0 - 100.0 fL   MCH 29.5 26.0 - 34.0 pg   MCHC 32.9 30.0 - 36.0 g/dL   RDW 03.413.5 74.211.5 - 59.515.5 %   Platelets 287 150 - 400 K/uL   nRBC 0.0 0.0 - 0.2 %    Comment: Performed at [facility], [address] [place], [place] [id number]  Urinalysis, Complete w Microscopic     Status: Abnormal  Collection Time: 08/23/19  4:48 PM  Result Value Ref Range   Color, Urine STRAW (A) YELLOW   APPearance CLEAR (A) CLEAR   Specific Gravity, Urine 1.025 1.005 - 1.030   pH 5.0 5.0 - 8.0   Glucose, UA >=500 (A) NEGATIVE mg/dL   Hgb urine dipstick NEGATIVE NEGATIVE   Bilirubin Urine NEGATIVE NEGATIVE   Ketones, ur 80 (A) NEGATIVE mg/dL   Protein, ur 30 (A) NEGATIVE mg/dL   Nitrite NEGATIVE NEGATIVE   Leukocytes,Ua NEGATIVE NEGATIVE   RBC / HPF 0-5 0 - 5 RBC/hpf   WBC, UA 0-5 0 - 5 WBC/hpf   Bacteria, UA NONE SEEN NONE SEEN   Squamous Epithelial / LPF 0-5 0 - 5   Mucus PRESENT    Hyaline Casts, UA PRESENT     Comment: Performed at [facility], [address] [place], [place] [address]  Glucose,  capillary     Status: Abnormal   Collection Time: 08/23/19  4:54 PM  Result Value Ref Range   Glucose-Capillary 291 (H) 70 - 99 mg/dL  Blood gas, venous     Status: Abnormal   Collection Time: 08/23/19  5:19 PM  Result Value Ref Range   pH, Ven 7.30 7.250 - 7.430   pCO2, Ven 26 (L) 44.0 - 60.0 mmHg   pO2, Ven 52.0 (H) 32.0 - 45.0 mmHg   Bicarbonate 12.8 (L) 20.0 - 28.0 mmol/L   Acid-base deficit 11.9 (H) 0.0 - 2.0 mmol/L   O2 Saturation 82.2 %   Patient temperature 37.0    Collection site VENOUS    Sample type VENOUS     Comment: Performed at [facility], [address] [place], [place] [address]  Beta-hydroxybutyric acid     Status: Abnormal   Collection Time: 08/23/19  5:19 PM  Result Value Ref Range   Beta-Hydroxybutyric Acid 7.03 (H) 0.05 - 0.27 mmol/L    Comment: RESULT CONFIRMED BY MANUAL DILUTION Performed at [facility], [address] [place], [place] [address]   Comprehensive metabolic panel     Status: Abnormal   Collection Time: 08/23/19  5:35 PM  Result Value Ref Range   Sodium 141 135 - 145 mmol/L   Potassium 5.2 (H) 3.5 - 5.1 mmol/L    Comment: HEMOLYSIS AT THIS LEVEL MAY AFFECT RESULT   Chloride 108 98 - 111 mmol/L   CO2 15 (L) 22 - 32 mmol/L   Glucose, Bld 257 (H) 70 - 99 mg/dL   BUN 20 6 - 20 mg/dL   Creatinine, Ser 3.29 (H) 0.61 - 1.24 mg/dL   Calcium 9.6 8.9 - 92.4 mg/dL   Total Protein 7.4 6.5 - 8.1 g/dL   Albumin 4.6 3.5 - 5.0 g/dL   AST 35 15 - 41 U/L   ALT 32 0 - 44 U/L   Alkaline Phosphatase 104 38 - 126 U/L   Total Bilirubin 1.4 (H) 0.3 - 1.2 mg/dL   GFR calc non Af Amer >60 >60 mL/min   GFR calc Af Amer >60 >60 mL/min   Anion gap 18 (H) 5 - 15    Comment: Performed at [facility], [address] [place], [place] [address]  Troponin I (High Sensitivity)     Status: None   Collection Time: 08/23/19  5:35 PM  Result Value Ref Range   Troponin I (High Sensitivity) 3 <18 ng/L    Comment: (NOTE) Elevated high  sensitivity troponin I (hsTnI) values and significant  changes across serial  measurements may suggest ACS but many other  chronic and acute conditions are known to elevate hsTnI results.  Refer to the "Links" section for chest pain algorithms and additional  guidance. Performed at [facility], [address] [place], [place] [address]   Glucose, capillary     Status: Abnormal   Collection Time: 08/23/19  9:15 PM  Result Value Ref Range   Glucose-Capillary 107 (H) 70 - 99 mg/dL  Glucose, capillary     Status: None   Collection Time: 08/23/19 10:55 PM  Result Value Ref Range   Glucose-Capillary 82 70 - 99 mg/dL   No results found.  Pending Labs Unresulted Labs (From admission, onward)    Start     Ordered   08/24/19 0500  CBC with Differential  Daily,   STAT     08/23/19 2049   08/23/19 8527  Basic metabolic panel  STAT Now then every 4 hours ,   STAT     08/23/19 2049   08/23/19 2037  SARS CORONAVIRUS 2 (TAT 6-24 HRS) Nasopharyngeal Nasopharyngeal Swab  (Asymptomatic/Tier 2 Patients Labs)  Once,   STAT    Question Answer Comment  Is this test for diagnosis or screening Screening   Symptomatic for COVID-19 as defined by CDC No   Hospitalized for COVID-19 No   Admitted to ICU for COVID-19 No   Previously tested for COVID-19 Yes   Resident in a congregate (group) care setting No   Employed in healthcare setting No      08/23/19 2036          Vitals/Pain Today's Vitals   08/23/19 2030 08/23/19 2100 08/23/19 2130 08/23/19 2200  BP: 110/66 104/69 100/67 (!) 150/92  Pulse: (!) 107 (!) 106 (!) 115   Resp:      SpO2: 100% 99% 98%   Weight:      PainSc:        Isolation Precautions No active isolations  Medications Medications  oxyCODONE-acetaminophen (PERCOCET/ROXICET) 5-325 MG per tablet 1 tablet (has no administration in time range)  dextrose 5 %-0.45 % sodium chloride infusion (1,000 mLs Intravenous New Bag/Given 08/23/19 2156)  enoxaparin (LOVENOX)  injection 40 mg (has no administration in time range)  acetaminophen (TYLENOL) tablet 650 mg (has no administration in time range)  ondansetron (ZOFRAN) injection 4 mg (has no administration in time range)  magnesium hydroxide (MILK OF MAGNESIA) suspension 30 mL (has no administration in time range)  alum & mag hydroxide-simeth (MAALOX/MYLANTA) 200-200-20 MG/5ML suspension 30 mL (has no administration in time range)  metoCLOPramide (REGLAN) injection 10 mg (has no administration in time range)  promethazine (PHENERGAN) injection 12.5 mg (has no administration in time range)  insulin regular, human (MYXREDLIN) 100 units/ 100 mL infusion (0 Units/hr Intravenous Stopped 08/23/19 2300)  ondansetron (ZOFRAN) injection 4 mg (4 mg Intravenous Given 08/23/19 1657)  0.9 %  sodium chloride infusion ( Intravenous Stopped 08/23/19 1900)  promethazine (PHENERGAN) injection 25 mg (25 mg Intravenous Given 08/23/19 1904)  sodium chloride 0.9 % bolus 1,000 mL (0 mLs Intravenous Stopped 08/23/19 2136)  sodium chloride 0.9 % bolus 1,000 mL (0 mLs Intravenous Stopped 08/23/19 2136)    Mobility Walks  Low fall risk   Focused Assessments DKA   R Recommendations: See Admitting Provider Note  Report given to:   Additional Notes:

## 2019-08-23 NOTE — ED Notes (Signed)
Pt resting quietly. Fluids infusing. VSS. No acute distress noted.

## 2019-08-23 NOTE — ED Triage Notes (Signed)
C/O vomiting since 0400.  Arrives stating "I think I'm in DKA".  AAOx3.  Skin warm and dry. NAD

## 2019-08-23 NOTE — ED Notes (Signed)
Spoke with admitting md. Pt needs stepdown

## 2019-08-23 NOTE — ED Provider Notes (Signed)
[facility] Emergency Department Provider Note  Time seen: 6:10 PM  I have reviewed the triage vital signs and the nursing notes.   HISTORY  Chief Complaint Emesis   HPI Wesley [name] is a 23 y.o. male with a past medical history of type 1 diabetes, presents to the emergency department for nausea vomiting.  According to the patient since 4 AM this morning he has been nauseated with vomiting has not been able to keep down any food or liquids today.  Feels somewhat short of breath.  Denies any fever cough.  States very minimal amount of diarrhea today as well.  States this feels somewhat similar to past episodes of DKA the patient has experienced.   Past Medical History:  Diagnosis Date  . Diabetes mellitus without complication ([place])    type 1 diabetes    Patient Active Problem List   Diagnosis Date Noted  . Cellulitis of scrotum 07/18/2019  . Acute renal failure ([place])   . Tobacco use disorder 02/10/2018  . DKA (diabetic ketoacidoses) ([place]) 09/01/2016  . Sepsis ([place]) 10/02/2015  . Scrotal abscess   . Diabetes mellitus with ketoacidosis ([place]) 08/13/2012  . Type 1 diabetes mellitus ([place]) 02/06/2012    Past Surgical History:  Procedure Laterality Date  . HAND SURGERY    . INCISION AND DRAINAGE ABSCESS N/A 10/02/2015   Procedure: INCISION AND DRAINAGE ABSCESS;  Surgeon: Festus Aloe, MD;  Location: [facility] ORS;  Service: Urology;  Laterality: N/A;  . INCISION AND DRAINAGE ABSCESS N/A 07/18/2019   Procedure: INCISION AND DRAINAGE SCROTAL ABSCESS;  Surgeon: Abbie Sons, MD;  Location: [facility] OR[facility];  Service: Urology;  Laterality: N/A;  . KNEE ARTHROSCOPY WITH MEDIAL MENISECTOMY Right 03/17/2019   Procedure: KNEE ARTHROSCOPY WITH MEDIAL MENISECTOMY, POSSIBLE CHONDROPLASTY RIGHT - DIABETIC;  Surgeon: Leim Fabry, MD;  Location: [facility] ORS;  Service: Orthopedics;  Laterality: Right;  . TONSILLECTOMY    . TONSILLECTOMY AND ADENOIDECTOMY    . UMBILICAL HERNIA  REPAIR      Prior to Admission medications   Medication Sig Start Date End Date Taking? Authorizing Provider  blood glucose meter kit and supplies KIT Dispense based on patient and insurance preference. Use up to four times daily as directed. (FOR ICD-9 250.00, 250.01). 03/21/17   Gladstone Lighter, MD  insulin NPH-regular Human (70-30) 100 UNIT/ML injection Inject 40 Units into the skin every morning.    [provider]  insulin regular (NOVOLIN R) 100 units/mL injection Inject 1-10 Units into the skin 3 (three) times daily before meals. Per sliding scale    [provider]  oxyCODONE-acetaminophen (PERCOCET/ROXICET) 5-325 MG tablet Take 1 tablet by mouth every 6 (six) hours as needed for moderate pain. 07/19/19   Mayo, Pete Pelt, MD  sulfamethoxazole-trimethoprim (BACTRIM DS) 800-160 MG tablet Take 1 tablet by mouth 2 (two) times daily. 07/19/19   Mayo, Pete Pelt, MD    No Known Allergies  Family History  Problem Relation Age of Onset  . Diabetes Mellitus II Paternal Grandfather   . Hypertension Mother     Social History Social History   Tobacco Use  . Smoking status: Former Smoker    Packs/day: 0.50    Years: 4.00    Pack years: 2.00    Types: Cigarettes  . Smokeless tobacco: Never Used  Substance Use Topics  . Alcohol use: Yes    Comment: occasional  . Drug use: Yes    Types: Marijuana    Comment: PT DENIES THIS DURING PREOP  INTERVIEW ON 6-1 BUT IN DR PATELS H&P IT STATES HE DOES MARIJUANA    Review of Systems Constitutional: Negative for fever. Cardiovascular: Negative for chest pain. Respiratory: Minimal shortness of breath without cough. Gastrointestinal: Negative for abdominal pain.  Positive for nausea vomiting. Musculoskeletal: Negative for musculoskeletal complaints Neurological: Negative for headache All other ROS negative  ____________________________________________   PHYSICAL EXAM:  VITAL SIGNS: ED Triage Vitals  Enc Vitals Group      BP 08/23/19 1649 129/85     Pulse Rate 08/23/19 1649 (!) 125     Resp 08/23/19 1649 18     Temp --      Temp src --      SpO2 08/23/19 1649 99 %     Weight 08/23/19 1646 166 lb 10.7 oz (75.6 kg)     Height --      Head Circumference --      Peak Flow --      Pain Score 08/23/19 1646 6     Pain Loc --      Pain Edu? --      Excl. in [place]? --     Constitutional: Alert and oriented. Well appearing and in no distress. Eyes: Normal exam ENT      Head: Normocephalic and atraumatic      Mouth/Throat: Mucous membranes are moist. Cardiovascular: Regular rhythm rate around 120.  No murmur. Respiratory: Mild tachypnea.  No wheeze rales or rhonchi. Gastrointestinal: Soft and nontender. No distention.   Musculoskeletal: Nontender with normal range of motion in all extremities.  Neurologic:  Normal speech and language. No gross focal neurologic deficits  Skin:  Skin is warm, dry and intact.  Psychiatric: Mood and affect are normal.   ____________________________________________  INITIAL IMPRESSION / ASSESSMENT AND PLAN / ED COURSE  Pertinent labs & imaging results that were available during my care of the patient were reviewed by me and considered in my medical decision making (see chart for details).   Patient presents to the emergency department for nausea vomiting since 4 AM this morning.  Differential would include gastroenteritis, gastritis, DKA.  We will check labs, dose nausea medication, IV fluids and continue to closely monitor.  Patient agreeable plan of care.  Patient's labs consistent borderline DKA.  We will start insulin infusion.  Patient received several liters of fluids to nausea medications and feels too nauseated to try to drink anything by mouth.  We will admit to the hospital service for further treatment.  Wesley [name] was evaluated in Emergency Department on 08/23/2019 for the symptoms described in the history of present illness. He was evaluated in the context of  the global COVID-19 pandemic, which necessitated consideration that the patient might be at risk for infection with the SARS-CoV-2 virus that causes COVID-19. Institutional protocols and algorithms that pertain to the evaluation of patients at risk for COVID-19 are in a state of rapid change based on information released by regulatory bodies including the CDC and federal and state organizations. These policies and algorithms were followed during the patient's care in the ED.  CRITICAL CARE Performed by: Harvest Dark   Total critical care time: 30 minutes  Critical care time was exclusive of separately billable procedures and treating other patients.  Critical care was necessary to treat or prevent imminent or life-threatening deterioration.  Critical care was time spent personally by me on the following activities: development of treatment plan with patient and/or surrogate as well as nursing, discussions with consultants, evaluation of patient's  response to treatment, examination of patient, obtaining history from patient or surrogate, ordering and performing treatments and interventions, ordering and review of laboratory studies, ordering and review of radiographic studies, pulse oximetry and re-evaluation of patient's condition.   ____________________________________________   FINAL CLINICAL IMPRESSION(S) / ED DIAGNOSES  Nausea vomiting Diabetic ketoacidosis   Harvest Dark, MD 08/23/19 2027

## 2019-08-23 NOTE — H&P (Signed)
[facility] NAME: Wesley [name]    MR#:  [id number]  DATE OF BIRTH:  [birth year]  DATE OF ADMISSION:  08/23/2019  PRIMARY CARE PHYSICIAN: [facility] [place]   REQUESTING/REFERRING PHYSICIAN: Harvest Dark, MD CHIEF COMPLAINT:   Chief Complaint  Patient presents with  . Emesis    HISTORY OF PRESENT ILLNESS:  Wesley [name]  is a 23 y.o. male with a known history of type 1 diabetes mellitus, presented to the emergency room with onset of intractable nausea and vomiting since 4 PM today.  He denied any bilious vomitus or hematemesis.  He denied any diarrhea.  He admitted to polyuria and polydipsia.  He has been having mild epigastric abdominal pain earlier that has currently resolved as well as palpitations without chest pain.  No recent cough or wheezing or shortness of breath.  No fever or chills.  No recent sick exposures to COVID-19.  Upon presentation to the emergency room, heart rate was 125 and blood pressure 129/85, and later 144/88 then 111/67 with otherwise normal vital signs.  Labs revealed venous blood gas with pH of 7.3 and bicarbonate of 12.8.  CMP revealed a potassium of 5.2 and a blood glucose 257 with CO2 of 15 anion gap of 18.  High-sensitivity troponin I was 3 and CBC showed leukocytosis of 15.9.  Beta hydroxybutyrate was 7.03.  Urinalysis showed 30 protein more than 500 glucose with 80 ketones.  The patient was given 2 L bolus of IV normal saline and 4 mg IV Zofran, 25 mg of IV Phenergan and was started on IV insulin drip.  He will be admitted to a stepdown unit for further evaluation and management. PAST MEDICAL HISTORY:   Past Medical History:  Diagnosis Date  . Diabetes mellitus without complication ([place])    type 1 diabetes    PAST SURGICAL HISTORY:   Past Surgical History:  Procedure Laterality Date  . HAND SURGERY    . INCISION AND DRAINAGE ABSCESS N/A 10/02/2015   Procedure: INCISION AND  DRAINAGE ABSCESS;  Surgeon: Festus Aloe, MD;  Location: [facility] ORS;  Service: Urology;  Laterality: N/A;  . INCISION AND DRAINAGE ABSCESS N/A 07/18/2019   Procedure: INCISION AND DRAINAGE SCROTAL ABSCESS;  Surgeon: Abbie Sons, MD;  Location: [facility] ORS;  Service: Urology;  Laterality: N/A;  . KNEE ARTHROSCOPY WITH MEDIAL MENISECTOMY Right 03/17/2019   Procedure: KNEE ARTHROSCOPY WITH MEDIAL MENISECTOMY, POSSIBLE CHONDROPLASTY RIGHT - DIABETIC;  Surgeon: Leim Fabry, MD;  Location: [facility] ORS;  Service: Orthopedics;  Laterality: Right;  . TONSILLECTOMY    . TONSILLECTOMY AND ADENOIDECTOMY    . UMBILICAL HERNIA REPAIR      SOCIAL HISTORY:   Social History   Tobacco Use  . Smoking status: Former Smoker    Packs/day: 0.50    Years: 4.00    Pack years: 2.00    Types: Cigarettes  . Smokeless tobacco: Never Used  Substance Use Topics  . Alcohol use: Yes    Comment: occasional    FAMILY HISTORY:   Family History  Problem Relation Age of Onset  . Diabetes Mellitus II Paternal Grandfather   . Hypertension Mother     DRUG ALLERGIES:  No Known Allergies  REVIEW OF SYSTEMS:   ROS As per history of present illness. All pertinent systems were reviewed above. Constitutional,  HEENT, cardiovascular, respiratory, GI, GU, musculoskeletal, neuro, psychiatric, endocrine,  integumentary and hematologic systems were reviewed and are otherwise  negative/unremarkable except for  positive findings mentioned above in the HPI.   MEDICATIONS AT HOME:   Prior to Admission medications   Medication Sig Start Date End Date Taking? Authorizing Provider  blood glucose meter kit and supplies KIT Dispense based on patient and insurance preference. Use up to four times daily as directed. (FOR ICD-9 250.00, 250.01). 03/21/17   Gladstone Lighter, MD  insulin NPH-regular Human (70-30) 100 UNIT/ML injection Inject 40 Units into the skin every morning.    [provider]  insulin regular (NOVOLIN  R) 100 units/mL injection Inject 1-10 Units into the skin 3 (three) times daily before meals. Per sliding scale    [provider]  oxyCODONE-acetaminophen (PERCOCET/ROXICET) 5-325 MG tablet Take 1 tablet by mouth every 6 (six) hours as needed for moderate pain. 07/19/19   Mayo, Pete Pelt, MD  sulfamethoxazole-trimethoprim (BACTRIM DS) 800-160 MG tablet Take 1 tablet by mouth 2 (two) times daily. 07/19/19   Mayo, Pete Pelt, MD      VITAL SIGNS:  Blood pressure (!) 144/88, pulse (!) 104, resp. rate 16, weight 75.6 kg, SpO2 99 %.  PHYSICAL EXAMINATION:  Physical Exam  GENERAL:  23 y.o.-year-old patient lying in the bed with no acute distress.  EYES: Pupils equal, round, reactive to light and accommodation. No scleral icterus. Extraocular muscles intact.  HEENT: Head atraumatic, normocephalic. Oropharynx with slightly dry mucous membrane and tongue and nasopharynx clear.  NECK:  Supple, no jugular venous distention. No thyroid enlargement, no tenderness.  LUNGS: Normal breath sounds bilaterally, no wheezing, rales,rhonchi or crepitation. No use of accessory muscles of respiration.  CARDIOVASCULAR: Regular rate and rhythm, S1, S2 normal. No murmurs, rubs, or gallops.  ABDOMEN: Soft, nondistended, nontender. Bowel sounds present. No organomegaly or mass.  EXTREMITIES: No pedal edema, cyanosis, or clubbing.  NEUROLOGIC: Cranial nerves II through XII are intact. Muscle strength 5/5 in all extremities. Sensation intact. Gait not checked.  PSYCHIATRIC: The patient is alert and oriented x 3.  Normal affect and good eye contact. SKIN: No obvious rash, lesion, or ulcer.   LABORATORY PANEL:   CBC Recent Labs  Lab 08/23/19 1648  WBC 15.9*  HGB 16.5  HCT 50.2  PLT 287   ------------------------------------------------------------------------------------------------------------------  Chemistries  Recent Labs  Lab 08/23/19 1735  NA 141  K 5.2*  CL 108  CO2 15*  GLUCOSE 257*  BUN  20  CREATININE 1.28*  CALCIUM 9.6  AST 35  ALT 32  ALKPHOS 104  BILITOT 1.4*   ------------------------------------------------------------------------------------------------------------------  Cardiac Enzymes No results for input(s): TROPONINI in the last 168 hours. ------------------------------------------------------------------------------------------------------------------  RADIOLOGY:  No results found.    IMPRESSION AND PLAN:   1.  DKA with uncontrolled type 1 diabetes mellitus. The patient will be admitted to an stepdown bed and will be placed on IV insulin drip per DKA protocol.  The patient will be aggressively hydrated with IV normal saline.  Will follow serial BMPs.  2.  Intractable nausea and vomiting.  Patient will be hydrated with IV normal saline and placed on as needed and scheduled antiemetics.  3.  Mild acute kidney injury.  Will follow renal functions with hydration.  4.  Leukocytosis.  This is likely secondary to stress demargination.  Will follow CBC with above management.  5.  DVT prophylaxis.  Subcutaneous Lovenox   All the records are reviewed and case discussed with ED provider. The plan of care was discussed in details with the patient (and family). I answered all questions. The patient agreed to proceed with  the above mentioned plan. Further management will depend upon hospital course.   CODE STATUS: Full code  TOTAL TIME TAKING CARE OF THIS PATIENT: 50 minutes.    Christel Mormon M.D on 08/23/2019 at 8:49 PM  [facility]   From 7 PM-7 AM, contact night-coverage [email or web address]  CC: Primary care physician; [facility] [place]   Note: This dictation was prepared with Diplomatic Services operational officer dictation along with smaller phrase technology. Any transcriptional errors that result from this process are unintentional.

## 2019-08-24 LAB — HEMOGLOBIN A1C
Hgb A1c MFr Bld: 11.3 % — ABNORMAL HIGH (ref 4.8–5.6)
Mean Plasma Glucose: 277.61 mg/dL

## 2019-08-24 LAB — GLUCOSE, CAPILLARY
Glucose-Capillary: 117 mg/dL — ABNORMAL HIGH (ref 70–99)
Glucose-Capillary: 119 mg/dL — ABNORMAL HIGH (ref 70–99)
Glucose-Capillary: 120 mg/dL — ABNORMAL HIGH (ref 70–99)
Glucose-Capillary: 121 mg/dL — ABNORMAL HIGH (ref 70–99)
Glucose-Capillary: 145 mg/dL — ABNORMAL HIGH (ref 70–99)
Glucose-Capillary: 145 mg/dL — ABNORMAL HIGH (ref 70–99)
Glucose-Capillary: 151 mg/dL — ABNORMAL HIGH (ref 70–99)
Glucose-Capillary: 152 mg/dL — ABNORMAL HIGH (ref 70–99)
Glucose-Capillary: 170 mg/dL — ABNORMAL HIGH (ref 70–99)
Glucose-Capillary: 93 mg/dL (ref 70–99)
Glucose-Capillary: 98 mg/dL (ref 70–99)

## 2019-08-24 LAB — BASIC METABOLIC PANEL
Anion gap: 12 (ref 5–15)
Anion gap: 10 (ref 5–15)
Anion gap: 9 (ref 5–15)
BUN: 15 mg/dL (ref 6–20)
BUN: 14 mg/dL (ref 6–20)
BUN: 11 mg/dL (ref 6–20)
CO2: 17 mmol/L — ABNORMAL LOW (ref 22–32)
CO2: 20 mmol/L — ABNORMAL LOW (ref 22–32)
CO2: 20 mmol/L — ABNORMAL LOW (ref 22–32)
Calcium: 9.1 mg/dL (ref 8.9–10.3)
Calcium: 9.2 mg/dL (ref 8.9–10.3)
Calcium: 8.5 mg/dL — ABNORMAL LOW (ref 8.9–10.3)
Chloride: 114 mmol/L — ABNORMAL HIGH (ref 98–111)
Chloride: 114 mmol/L — ABNORMAL HIGH (ref 98–111)
Chloride: 115 mmol/L — ABNORMAL HIGH (ref 98–111)
Creatinine, Ser: 1.04 mg/dL (ref 0.61–1.24)
Creatinine, Ser: 0.98 mg/dL (ref 0.61–1.24)
Creatinine, Ser: 0.79 mg/dL (ref 0.61–1.24)
GFR calc Af Amer: >60 mL/min (ref >60)
GFR calc Af Amer: >60 mL/min (ref >60)
GFR calc Af Amer: >60 mL/min (ref >60)
GFR calc non Af Amer: >60 mL/min (ref >60)
GFR calc non Af Amer: >60 mL/min (ref >60)
GFR calc non Af Amer: >60 mL/min (ref >60)
Glucose, Bld: 109 mg/dL — ABNORMAL HIGH (ref 70–99)
Glucose, Bld: 121 mg/dL — ABNORMAL HIGH (ref 70–99)
Glucose, Bld: 149 mg/dL — ABNORMAL HIGH (ref 70–99)
Potassium: 3.9 mmol/L (ref 3.5–5.1)
Potassium: 3.9 mmol/L (ref 3.5–5.1)
Potassium: 3.5 mmol/L (ref 3.5–5.1)
Sodium: 143 mmol/L (ref 135–145)
Sodium: 144 mmol/L (ref 135–145)
Sodium: 144 mmol/L (ref 135–145)

## 2019-08-24 LAB — CBC WITH DIFFERENTIAL/PLATELET
Abs Immature Granulocytes: 0.06 10*3/uL (ref 0.00–0.07)
Basophils Absolute: 0 10*3/uL (ref 0.0–0.1)
Basophils Relative: 0 %
Eosinophils Absolute: 0 10*3/uL (ref 0.0–0.5)
Eosinophils Relative: 0 %
HCT: 41.3 % (ref 39.0–52.0)
Hemoglobin: 13.6 g/dL (ref 13.0–17.0)
Immature Granulocytes: 0 %
Lymphocytes Relative: 11 %
Lymphs Abs: 1.7 10*3/uL (ref 0.7–4.0)
MCH: 29.2 pg (ref 26.0–34.0)
MCHC: 32.9 g/dL (ref 30.0–36.0)
MCV: 88.6 fL (ref 80.0–100.0)
Monocytes Absolute: 0.8 10*3/uL (ref 0.1–1.0)
Monocytes Relative: 5 %
Neutro Abs: 12.4 10*3/uL — ABNORMAL HIGH (ref 1.7–7.7)
Neutrophils Relative %: 84 %
Platelets: 253 10*3/uL (ref 150–400)
RBC: 4.66 MIL/uL (ref 4.22–5.81)
RDW: 13.7 % (ref 11.5–15.5)
WBC: 14.9 10*3/uL — ABNORMAL HIGH (ref 4.0–10.5)
nRBC: 0 % (ref 0.0–0.2)

## 2019-08-24 LAB — SARS CORONAVIRUS 2 (TAT 6-24 HRS): SARS Coronavirus 2: NEGATIVE

## 2019-08-24 MED ORDER — SODIUM BICARBONATE 650 MG PO TABS
1300.0000 mg | ORAL_TABLET | Freq: Every day | ORAL | Status: AC
  Administered 2019-08-24 – 2019-08-25 (×2): 1300 mg via ORAL
  Filled 2019-08-24 (×3): qty 2

## 2019-08-24 MED ORDER — INSULIN ASPART 100 UNIT/ML ~~LOC~~ SOLN: 0.0000 [IU] | Freq: Every day | SUBCUTANEOUS | Status: DC

## 2019-08-24 MED ORDER — INSULIN REGULAR HUMAN 100 UNIT/ML IJ SOLN: 1.0000 [IU] | Freq: Three times a day (TID) | INTRAMUSCULAR | 0 refills | Status: DC

## 2019-08-24 MED ORDER — ONETOUCH ULTRASOFT LANCETS MISC: 0 refills | Status: DC

## 2019-08-24 MED ORDER — INSULIN SYRINGES (DISPOSABLE) U-100 0.5 ML MISC: 40.0000 [IU] | Freq: Three times a day (TID) | 0 refills | Status: DC

## 2019-08-24 MED ORDER — INSULIN GLARGINE 100 UNIT/ML ~~LOC~~ SOLN
10.0000 [IU] | Freq: Every day | SUBCUTANEOUS | Status: DC
  Administered 2019-08-24 – 2019-08-26 (×3): 10 [IU] via SUBCUTANEOUS
  Filled 2019-08-24 (×3): qty 0.1

## 2019-08-24 MED ORDER — INSULIN NPH ISOPHANE & REGULAR (70-30) 100 UNIT/ML ~~LOC~~ SUSP: 40.0000 [IU] | SUBCUTANEOUS | 0 refills | Status: DC

## 2019-08-24 MED ORDER — INSULIN ASPART 100 UNIT/ML ~~LOC~~ SOLN
0.0000 [IU] | Freq: Three times a day (TID) | SUBCUTANEOUS | Status: DC
  Administered 2019-08-24: 2 [IU] via SUBCUTANEOUS
  Administered 2019-08-25: 13:00:00 3 [IU] via SUBCUTANEOUS
  Administered 2019-08-25: 17:00:00 1 [IU] via SUBCUTANEOUS
  Administered 2019-08-25 – 2019-08-26 (×2): 5 [IU] via SUBCUTANEOUS
  Filled 2019-08-24 (×5): qty 1

## 2019-08-24 MED ORDER — SODIUM CHLORIDE 0.9 % IV SOLN
INTRAVENOUS | Status: DC
  Administered 2019-08-24 – 2019-08-26 (×4): via INTRAVENOUS

## 2019-08-24 MED ORDER — GLUCOSE BLOOD VI STRP: ORAL_STRIP | 0 refills | Status: DC

## 2019-08-24 MED ORDER — POTASSIUM CHLORIDE CRYS ER 20 MEQ PO TBCR
40.0000 meq | EXTENDED_RELEASE_TABLET | Freq: Every day | ORAL | Status: AC
  Administered 2019-08-24 – 2019-08-25 (×2): 40 meq via ORAL
  Filled 2019-08-24 (×2): qty 2

## 2019-08-24 MED ORDER — BLOOD GLUCOSE MONITOR KIT: PACK | 0 refills | Status: DC

## 2019-08-24 NOTE — Progress Notes (Addendum)
Inpatient Diabetes Program Recommendations  AACE/ADA: New Consensus Statement on Inpatient Glycemic Control (2015)  Target Ranges:  Prepandial:   less than 140 mg/dL      Peak postprandial:   less than 180 mg/dL (1-2 hours)      Critically ill patients:  140 - 180 mg/dL   Lab Results  Component Value Date   GLUCAP 151 (H) 08/24/2019   HGBA1C 11.3 (H) 08/24/2019    Review of Glycemic Control  Diabetes history: DM type 1 Outpatient Diabetes medications: 70/30 Insulin 40 units AM Regular Insulin 1-10 units TID per SSI Current orders for Inpatient glycemic control:  Lantus 10 Novolog 0-9 units + hs  Inpatient Diabetes Program Recommendations:    Patient may need more basal insulin, Consider increasing Lantus to 25 units.  Followed up with patient from DM Coordinator conversation on 10/06.  Patient has not followed up with the [facility] since that hospitalization. Reiterated that 70/30 insulin is meant to be given bid instead of daily dose that is ordered currently for home. Patient to call clinic for follow up. Pt reports glucose trends have been about the same at home.   Pt reports needing one touch strips and lancets in addition to 50 units syringes at time of d/c. Strips order # [id number] Lancets order # [id number] Syringes order # [id number]  Thanks,  Tama Headings RN, MSN, BC-ADM Inpatient Diabetes Coordinator Team Pager [phone] (8a-5p)

## 2019-08-24 NOTE — ED Notes (Signed)
Pt nauseated and continues to vomit

## 2019-08-24 NOTE — Discharge Instructions (Signed)
Type 1 Diabetes Mellitus, Self Care, Adult When you have type 1 diabetes (type 1 diabetes mellitus), you must make sure your blood sugar (glucose) stays in a healthy range. You can do this with:  Insulin.  Nutrition.  Exercise.  Lifestyle changes.  Other medicines, if needed.  Support from your doctors and others. How to stay aware of blood sugar   Check your blood sugar every day, as often as told.  Have your A1c (hemoglobin A1c) level checked two or more times a year. Have it checked more often if your doctor tells you to. Your doctor will set personal treatment goals for you. Generally, you should have these blood sugar levels:  Before meals (preprandial): 80-130 mg/dL (4.4-7.2 mmol/L).  After meals (postprandial): below 180 mg/dL (10 mmol/L).  A1c level: less than 7%. How to manage high and low blood sugar Signs of high blood sugar High blood sugar is called hyperglycemia. Know the signs of high blood sugar. Signs may include:  Feeling: ? Thirsty. ? Hungry. ? Very tired.  Needing to pee (urinate) more than usual.  Blurry vision. Signs of low blood sugar Low blood sugar is called hypoglycemia. This is when blood sugar is at or below 70 mg/dL (3.9 mmol/L). Signs may include:  Feeling: ? Hungry. ? Worried or nervous (anxious). ? Sweaty and clammy. ? Confused. ? Dizzy. ? Sleepy. ? Sick to your stomach (nauseous).  Having: ? A fast heartbeat. ? A headache. ? A change in your vision. ? Tingling or no feeling (numbness) around your mouth, lips, or tongue. ? Jerky movements that you cannot control (seizure).  Having trouble with: ? Moving (coordination). ? Sleeping. ? Passing out (fainting). ? Getting upset easily (irritability). Treating low blood sugar To treat low blood sugar, eat or drink something sugary right away. If you can think clearly and swallow safely, follow the 15:15 rule:  Take 15 grams of a fast-acting carb (carbohydrate). Talk with your  doctor about how much you should take.  Some fast-acting carbs are: ? Sugar tablets (glucose pills). Take 3-4 pills. ? 6-8 pieces of hard candy. ? 4-6 oz (120-50 mL) of fruit juice. ? 4-6 oz (120-150 mL) of regular (not diet) soda. ? Honey or sugar (1 Tbsp).  Check your blood sugar 15 minutes after you take the carb.  If your blood sugar is still at or below 70 mg/dL (3.9 mmol/L), take 15 grams of a carb again.  If your blood sugar does not go above 70 mg/dL (3.9 mmol/L) after 3 tries, get help right away.  After your blood sugar goes back to normal, eat a meal or a snack within 1 hour. Treating very low blood sugar If your blood sugar is at or below 54 mg/dL (3 mmol/L), you have very low blood sugar (severe hypoglycemia). This is an emergency. Do not wait to see if the symptoms will go away. Get medical help right away. Call your local emergency services (911 in the [place] If you have very low blood sugar and you cannot eat or drink, you may need a glucagon shot (injection). A family member or friend should learn how to check your blood sugar and how to give you a glucagon shot. Ask your doctor if you need to have a glucagon shot kit at home. Follow these instructions at home: Medicine  Take insulin and diabetes medicines as told.  If your doctor says you should take more or less insulin and medicines, do this exactly as told.  Do  not run out of insulin or medicines. Having diabetes can put you at risk for other long-term conditions. These include heart disease and kidney disease. Your doctor may prescribe medicines to help you not have these problems. Food   Make healthy food choices. These include: ? Chicken, fish, egg whites, and beans. ? Oats, whole wheat, bulgur, brown rice, quinoa, and millet. ? Fresh fruits and vegetables. ? Low-fat dairy products. ? Nuts, avocado, olive oil, and canola oil.  Meet with a food specialist (registered dietitian). He or she can help you make  an eating plan that is right for you.  Follow instructions from your doctor about what you cannot eat or drink.  Drink enough fluid to keep your pee (urine) pale yellow.  Keep track of carbs that you eat. Do this by reading food labels and learning food serving sizes.  Follow your sick day plan when you cannot eat or drink normally. Make this plan with your doctor so it is ready to use. Activity  Exercise 3 or more times a week.  Do not go more than 2 days without exercising.  Talk with your doctor before you start a new exercise. Your doctor may need to tell you to change: ? How much insulin or medicines you take. ? How much food you eat. Lifestyle  Do not use any tobacco products. These include cigarettes, chewing tobacco, and e-cigarettes. If you need help quitting, ask your doctor.  Ask your doctor how much alcohol is safe for you.  Learn to deal with stress. If you need help with this, ask your doctor. Body care   Stay up to date with your shots (immunizations).  Have your eyes and feet checked by a doctor as often as told.  Check your skin and feet every day. Check for cuts, bruises, redness, blisters, or sores.  Brush your teeth and gums two times a day. Floss one or more times a day.  Go to the dentist one or more times every 6 months.  Stay at a healthy weight. General instructions  Take over-the-counter and prescription medicines only as told by your doctor.  Share your diabetes care plan with: ? Your work or school. ? People you live with.  Check your pee (urine) for ketones: ? When you are sick. ? As told by your doctor.  Carry a card or wear jewelry that says you have diabetes.  Keep all follow-up visits as told by your doctor. This is important. Questions to ask your doctor  Do I need to meet with a diabetes educator?  Where can I find a support group for people with diabetes? Where to find more information To learn more about diabetes,  visit:  American Diabetes Association: www.diabetes.org  American Association of Diabetes Educators: www.diabeteseducator.org Summary  When you have type 1 diabetes, you must make sure your blood sugar (glucose) stays in a healthy range.  Check your blood sugar every day, as often as told.  Take insulin and diabetes medicines as told.  Keep all follow-up visits as told by your doctor. This is important. This information is not intended to replace advice given to you by your health care provider. Make sure you discuss any questions you have with your health care provider. Document Released: 01/21/2016 Document Revised: 04/22/2018 Document Reviewed: 11/02/2015 Elsevier Patient Education  [place].   Diabetic Ketoacidosis Diabetic ketoacidosis is a serious complication of diabetes. This condition develops when there is not enough insulin in the body. Insulin is  an hormone that regulates blood sugar levels in the body. Normally, insulin allows glucose to enter the cells in the body. The cells break down glucose for energy. Without enough insulin, the body cannot break down glucose, so it breaks down fats instead. This leads to high blood glucose levels in the body and the production of acids that are called ketones. Ketones are poisonous at high levels. If diabetic ketoacidosis is not treated, it can cause severe dehydration and can lead to a coma or death. What are the causes? This condition develops when a lack of insulin causes the body to break down fats instead of glucose. This may be triggered by:  Stress on the body. This stress is brought on by an illness.  Infection.  Medicines that raise blood glucose levels.  Not taking diabetes medicine.  New onset of type 1 diabetes mellitus. What are the signs or symptoms? Symptoms of this condition include:  Fatigue.  Weight loss.  Excessive thirst.  Light-headedness.  Fruity or sweet-smelling breath.  Excessive  urination.  Vision changes.  Confusion or irritability.  Nausea.  Vomiting.  Rapid breathing.  Abdominal pain.  Feeling flushed. How is this diagnosed? This condition is diagnosed based on your medical history, a physical exam, and blood tests. You may also have a urine test to check for ketones. How is this treated? This condition may be treated with:  Fluid replacement. This may be done to correct dehydration.  Insulin injections. These may be given through the skin or through an IV tube.  Electrolyte replacement. Electrolytes are minerals in your blood. Electrolytes such as potassium and sodium may be given in pill form or through an IV tube.  Antibiotic medicines. These may be prescribed if your condition was caused by an infection. Diabetic ketoacidosis is a serious medical condition. You may need emergency treatment in the hospital to monitor your condition. Follow these instructions at home: Eating and drinking  Drink enough fluids to keep your urine clear or pale yellow.  If you are not able to eat, drink clear fluids in small amounts as you are able. Clear fluids include water, ice chips, fruit juice with water added (diluted), and low-calorie sports drinks. You may also have sugar-free jello or popsicles.  If you are able to eat, follow your usual diet and drink sugar-free liquids, such as water. Medicines  Take over-the-counter and prescription medicines only as told by your health care provider.  Continue to take insulin and other diabetes medicines as told by your health care provider.  If you were prescribed an antibiotic, take it as told by your health care provider. Do not stop taking the antibiotic even if you start to feel better. General instructions   Check your urine for ketones when you are ill and as told by your health care provider. ? If your blood glucose is 240 mg/dL (13.3 mmol/L) or higher, check your urine ketones every 4-6 hours.  Check  your blood glucose every day, as often as told by your health care provider. ? If your blood glucose is high, drink plenty of fluids. This helps to flush out ketones. ? If your blood glucose is above your target for 2 tests in a row, contact your health care provider.  Carry a medical alert card or wear medical alert jewelry that says that you have diabetes.  Rest and exercise only as told by your health care provider. Do not exercise when your blood glucose is high and you have ketones  in your urine.  If you get sick, call your health care provider and begin treatment quickly. Your body often needs extra insulin to fight an illness. Check your blood glucose every 4-6 hours when you are sick.  Keep all follow-up visits as told by your health care provider. This is important. Contact a health care provider if:  Your blood glucose level is higher than 240 mg/dL (13.3 mmol/L) for 2 days in a row.  You have moderate or large ketones in your urine.  You have a fever.  You cannot eat or drink without vomiting.  You have been vomiting for more than 2 hours.  You continue to have symptoms of diabetic ketoacidosis.  You develop new symptoms. Get help right away if:  Your blood glucose monitor reads high even when you are taking insulin.  You faint.  You have chest pain.  You have trouble breathing.  You have sudden trouble speaking or swallowing.  You have vomiting or diarrhea that gets worse after 3 hours.  You are unable to stay awake.  You have trouble thinking.  You are severely dehydrated. Symptoms of severe dehydration include: ? Extreme thirst. ? Dry mouth. ? Rapid breathing. These symptoms may represent a serious problem that is an emergency. Do not wait to see if the symptoms will go away. Get medical help right away. Call your local emergency services (911 in the [place] Do not drive yourself to the hospital. Summary  Diabetic ketoacidosis is a serious  complication of diabetes. This condition develops when there is not enough insulin in the body.  This condition is diagnosed based on your medical history, a physical exam, and blood tests. You may also have a urine test to check for ketones.  Diabetic ketoacidosis is a serious medical condition. You may need emergency treatment in the hospital to monitor your condition.  Contact your health care provider if your blood glucose is higher than 240 mg/dl for 2 days in a row or if you have moderate or large ketones in your urine. This information is not intended to replace advice given to you by your health care provider. Make sure you discuss any questions you have with your health care provider. Document Released: 09/26/2000 Document Revised: 11/14/2016 Document Reviewed: 11/03/2016 Elsevier Patient Education  2020 [employer].

## 2019-08-24 NOTE — ED Notes (Signed)
Orders reviewed by this RN, patient is NPO, will apologize and explain to patient at this time.

## 2019-08-24 NOTE — ED Notes (Signed)
This RN sent message to admitting MD regarding D5 1/2NS and insulin drip, according to chart patient was on insulin drip for 1hr, CBG prior to insulin drip 93, after insulin drip for 1hr at a rate of 0.5 with D5 1/2NS CBG 82, drip stopped. This RN spoke with Dr. Charna Archer, [place], per EDP stop D5 1/2NS due to CBG at 0717 145 until orders received from admitting. Per admitting MD will order 10units of Lantus.   This RN to bedside, introduced self to patient. Pt resting in bed, visualized in NAD at this time, requesting ice water to drink. Will verify in orders that patient is not NPO and give ice water to patient as requested. Will continue to monitor for further patient needs.

## 2019-08-24 NOTE — ED Notes (Signed)
Pt CBG 140

## 2019-08-24 NOTE — ED Notes (Signed)
Resting without N/V

## 2019-08-24 NOTE — ED Notes (Signed)
Patient given breakfast tray. Sitting up in bed using urinal.

## 2019-08-24 NOTE — Progress Notes (Signed)
PROGRESS NOTE  Wesley [name] GNF:[id number] DOB: [birth year] DOA: 08/23/2019 PCP: [facility] [place]  HPI/Recap of past 24 hours: Wesley [name]  is a 23 y.o. male with a known history of type 1 diabetes mellitus, presented to the emergency room with onset of intractable nausea and vomiting since 4 PM today.  He denied any bilious vomitus or hematemesis.  He denied any diarrhea.  He admitted to polyuria and polydipsia.  He has been having mild epigastric abdominal pain earlier that has currently resolved as well as palpitations without chest pain.  No recent cough or wheezing or shortness of breath.  No fever or chills.  No recent sick exposures to COVID-19.  Upon presentation to the emergency room, heart rate was 125 and blood pressure 129/85, and later 144/88 then 111/67 with otherwise normal vital signs.  Labs revealed venous blood gas with pH of 7.3 and bicarbonate of 12.8.  CMP revealed a potassium of 5.2 and a blood glucose 257 with CO2 of 15 anion gap of 18.  High-sensitivity troponin I was 3 and CBC showed leukocytosis of 15.9.  Beta hydroxybutyrate was 7.03.  Urinalysis showed 30 protein more than 500 glucose with 80 ketones.  The patient was given 2 L bolus of IV normal saline and 4 mg IV Zofran, 25 mg of IV Phenergan and was started on IV insulin drip.  He will be admitted to a stepdown unit for further evaluation and management.  08/24/19: Patient was seen and examined at his bedside in the ED.  Reports persistent nausea with no abdominal pain.  No diarrhea.  Last bowel movement was yesterday.  States symptoms came on suddenly after eating [employer] food.  Denies running out of insulin at home.  Was in his usual state of health prior to this.  Patient is currently off insulin drip/D5 half-normal saline.  He has been transitioned to long-acting insulin.  His diet has been placed.  Assessment/Plan: Active Problems:   DKA, type 1 ([place])  DKA type1 possibly triggered  by acute gastroenteritis Self-reported symptoms came on suddenly after eating [employer] food Was in his usual state of health prior to this. Denies running out of insulin; denies fevers or chills at home Persistent nausea but improving Presented with serum glucose of 250, serum Bicarb 15 and anion gap of 18 Started on insulin drip now transition to long-acting insulin Start insulin sliding scale, continue diet carb modified Continue IV fluid hydration normal saline at 75 cc/h. Increase IV fluids rate 100 cc/h since minimal oral intake. Monitor electrolytes Repeat BMP this afternoon  High anion gap metabolic acidosis likely secondary to DKA Presented with serum glucose of 250, serum Bicarb 15 and anion gap of 18 Improving Serum bicarb 20 and anion gap of 12 on 08/24/2019 Repeat BMP in the afternoon  Suspected viral gastroenteritis Leukocytosis improving Presented with WBC 15.9K Repeat CBC in the morning Continue to monitor symptomatology Continue IV antiemetics Continue IV fluid hydration  Intractable nausea Management as stated above   Code Status: Full code   Family Communication: None at bedside   Disposition Plan: Likely dc in 24 to 48 h   Consultants:  None  Procedures:  None   Antimicrobials:  None   DVT prophylaxis:  SQ lovenox daily   Objective: Vitals:   08/24/19 0430 08/24/19 0700 08/24/19 0800 08/24/19 0927  BP: 116/70 (!) 131/92 108/71 129/76  Pulse: 100 68 69 84  Resp:    15  Temp:    98.5 F (  36.9 C)  TempSrc:    Oral  SpO2: 100% 100% 100% 99%  Weight:       No intake or output data in the 24 hours ending 08/24/19 1007 Filed Weights   08/23/19 1646  Weight: 75.6 kg    Exam:  . General: 23 y.o. year-old male well developed well nourished in no acute distress.  Alert and oriented x4. . Cardiovascular: Regular rate and rhythm with no rubs or gallops.  No thyromegaly or JVD noted.   Marland Kitchen Respiratory: Clear to auscultation with no wheezes  or rales. Good inspiratory effort. . Abdomen: Soft nontender nondistended with normal bowel sounds x4 quadrants. . Musculoskeletal: No lower extremity edema. 2/4 pulses in all 4 extremities. Marland Kitchen Psychiatry: Mood is appropriate for condition and setting   Data Reviewed: CBC: Recent Labs  Lab 08/23/19 1648 08/24/19 0454  WBC 15.9* 14.9*  NEUTROABS  --  12.4*  HGB 16.5 13.6  HCT 50.2 41.3  MCV 89.8 88.6  PLT 287 253   Basic Metabolic Panel: Recent Labs  Lab 08/23/19 1735 08/24/19 0040 08/24/19 0454  NA 141 143 144  K 5.2* 3.9 3.9  CL 108 114* 114*  CO2 15* 17* 20*  GLUCOSE 257* 109* 121*  BUN 20 15 14   CREATININE 1.28* 1.04 0.98  CALCIUM 9.6 9.1 9.2   GFR: Estimated Creatinine Clearance: 118.2 mL/min (by C-G formula based on SCr of 0.98 mg/dL). Liver Function Tests: Recent Labs  Lab 08/23/19 1735  AST 35  ALT 32  ALKPHOS 104  BILITOT 1.4*  PROT 7.4  ALBUMIN 4.6   No results for input(s): LIPASE, AMYLASE in the last 168 hours. No results for input(s): AMMONIA in the last 168 hours. Coagulation Profile: No results for input(s): INR, PROTIME in the last 168 hours. Cardiac Enzymes: No results for input(s): CKTOTAL, CKMB, CKMBINDEX, TROPONINI in the last 168 hours. BNP (last 3 results) No results for input(s): PROBNP in the last 8760 hours. HbA1C: No results for input(s): HGBA1C in the last 72 hours. CBG: Recent Labs  Lab 08/24/19 0346 08/24/19 0454 08/24/19 0550 08/24/19 0717 08/24/19 0748  GLUCAP 119* 117* 121* 145* 170*   Lipid Profile: No results for input(s): CHOL, HDL, LDLCALC, TRIG, CHOLHDL, LDLDIRECT in the last 72 hours. Thyroid Function Tests: No results for input(s): TSH, T4TOTAL, FREET4, T3FREE, THYROIDAB in the last 72 hours. Anemia Panel: No results for input(s): VITAMINB12, FOLATE, FERRITIN, TIBC, IRON, RETICCTPCT in the last 72 hours. Urine analysis:    Component Value Date/Time   COLORURINE STRAW (A) 08/23/2019 1648   APPEARANCEUR  CLEAR (A) 08/23/2019 1648   APPEARANCEUR Clear 12/24/2011 1505   LABSPEC 1.025 08/23/2019 1648   LABSPEC 1.030 12/24/2011 1505   PHURINE 5.0 08/23/2019 1648   GLUCOSEU >=500 (A) 08/23/2019 1648   GLUCOSEU >=500 12/24/2011 1505   HGBUR NEGATIVE 08/23/2019 1648   BILIRUBINUR NEGATIVE 08/23/2019 1648   BILIRUBINUR Negative 12/24/2011 1505   KETONESUR 80 (A) 08/23/2019 1648   PROTEINUR 30 (A) 08/23/2019 1648   NITRITE NEGATIVE 08/23/2019 1648   LEUKOCYTESUR NEGATIVE 08/23/2019 1648   LEUKOCYTESUR Negative 12/24/2011 1505   Sepsis Labs: @LABRCNTIP (procalcitonin:4,lacticidven:4)  ) Recent Results (from the past 240 hour(s))  SARS CORONAVIRUS 2 (TAT 6-24 HRS) Nasopharyngeal Nasopharyngeal Swab     Status: None   Collection Time: 08/23/19  9:41 PM   Specimen: Nasopharyngeal Swab  Result Value Ref Range Status   SARS Coronavirus 2 NEGATIVE NEGATIVE Final    Comment: (NOTE) SARS-CoV-2 target nucleic acids are NOT  DETECTED. The SARS-CoV-2 RNA is generally detectable in upper and lower respiratory specimens during the acute phase of infection. Negative results do not preclude SARS-CoV-2 infection, do not rule out co-infections with other pathogens, and should not be used as the sole basis for treatment or other patient management decisions. Negative results must be combined with clinical observations, patient history, and epidemiological information. The expected result is Negative. Fact Sheet for Patients: HairSlick.nohttps://www.fda.gov/media/138098/download Fact Sheet for Healthcare Providers: quierodirigir.comhttps://www.fda.gov/media/138095/download This test is not yet approved or cleared by the [place] FDA and  has been authorized for detection and/or diagnosis of SARS-CoV-2 by FDA under an Emergency Use Authorization (EUA). This EUA will remain  in effect (meaning this test can be used) for the duration of the COVID-19 declaration under Section 56 4(b)(1) of the Act, 21 U.S.C. section  360bbb-3(b)(1), unless the authorization is terminated or revoked sooner. Performed at [facility], [address] [place], [place] [address]       Studies: No results found.  Scheduled Meds: . enoxaparin (LOVENOX) injection  40 mg Subcutaneous Q24H  . insulin glargine  10 Units Subcutaneous Daily  . metoCLOPramide (REGLAN) injection  10 mg Intravenous Q6H    Continuous Infusions: . sodium chloride 75 mL/hr at 08/24/19 0913  . insulin Stopped (08/23/19 2300)     LOS: 1 day     Darlin Droparole N Elwyn Klosinski, MD [facility] Pager [phone]  If 7PM-7AM, please contact night-coverage [email or web address] Password [email or web address] 08/24/2019, 10:07 AM

## 2019-08-25 LAB — BASIC METABOLIC PANEL
Anion gap: 11 (ref 5–15)
BUN: 11 mg/dL (ref 6–20)
CO2: 20 mmol/L — ABNORMAL LOW (ref 22–32)
Calcium: 8.8 mg/dL — ABNORMAL LOW (ref 8.9–10.3)
Chloride: 109 mmol/L (ref 98–111)
Creatinine, Ser: 0.95 mg/dL (ref 0.61–1.24)
GFR calc Af Amer: >60 mL/min (ref >60)
GFR calc non Af Amer: >60 mL/min (ref >60)
Glucose, Bld: 301 mg/dL — ABNORMAL HIGH (ref 70–99)
Potassium: 4.2 mmol/L (ref 3.5–5.1)
Sodium: 140 mmol/L (ref 135–145)

## 2019-08-25 LAB — GLUCOSE, CAPILLARY
Glucose-Capillary: 127 mg/dL — ABNORMAL HIGH (ref 70–99)
Glucose-Capillary: 297 mg/dL — ABNORMAL HIGH (ref 70–99)
Glucose-Capillary: 233 mg/dL — ABNORMAL HIGH (ref 70–99)
Glucose-Capillary: 136 mg/dL — ABNORMAL HIGH (ref 70–99)
Glucose-Capillary: 111 mg/dL — ABNORMAL HIGH (ref 70–99)

## 2019-08-25 LAB — CBC WITH DIFFERENTIAL/PLATELET
Abs Immature Granulocytes: 0.02 10*3/uL (ref 0.00–0.07)
Basophils Absolute: 0 10*3/uL (ref 0.0–0.1)
Basophils Relative: 0 %
Eosinophils Absolute: 0 10*3/uL (ref 0.0–0.5)
Eosinophils Relative: 0 %
HCT: 36.9 % — ABNORMAL LOW (ref 39.0–52.0)
Hemoglobin: 12.9 g/dL — ABNORMAL LOW (ref 13.0–17.0)
Immature Granulocytes: 0 %
Lymphocytes Relative: 24 %
Lymphs Abs: 2.1 10*3/uL (ref 0.7–4.0)
MCH: 29.5 pg (ref 26.0–34.0)
MCHC: 35 g/dL (ref 30.0–36.0)
MCV: 84.4 fL (ref 80.0–100.0)
Monocytes Absolute: 0.5 10*3/uL (ref 0.1–1.0)
Monocytes Relative: 6 %
Neutro Abs: 6.3 10*3/uL (ref 1.7–7.7)
Neutrophils Relative %: 70 %
Platelets: 203 10*3/uL (ref 150–400)
RBC: 4.37 MIL/uL (ref 4.22–5.81)
RDW: 13.6 % (ref 11.5–15.5)
WBC: 9 10*3/uL (ref 4.0–10.5)
nRBC: 0 % (ref 0.0–0.2)

## 2019-08-25 LAB — MAGNESIUM: Magnesium: 1.6 mg/dL — ABNORMAL LOW (ref 1.7–2.4)

## 2019-08-25 NOTE — TOC Initial Note (Signed)
Transition of Care [facility]) - Initial/Assessment Note    Patient Details  Name: Wesley [name] MRN: [id number] Date of Birth: 25-Feb-1996  [facility]) CM/SW Contact:    Shelbie Hutching, RN Phone Number: 08/25/2019, 3:28 PM  Clinical Narrative:                 Patient admitted to the hospital for DKA.  Patient reports that his diabetes is very well controlled usually.  Patient gets his 70/30 insulin from [place], he checks his blood sugar 4 times per day.  Patient reports that his blood sugars typically run in the 100's to 200's.  PCP is Princella Ion and he has an upcoming appointment.  Patient lives with his mother in [place] and works as a tow Administrator.  Patient is insured and has no trouble getting his insulin.  No discharge needs at this time.  RNCM signing off.  Please reconsult if needs arise.   Expected Discharge Plan: Home/Self Care Barriers to Discharge: Continued Medical Work up   Patient Goals and CMS Choice        Expected Discharge Plan and Services Expected Discharge Plan: Home/Self Care   Discharge Planning Services: CM Consult   Living arrangements for the past 2 months: Single Family Home                                      Prior Living Arrangements/Services Living arrangements for the past 2 months: Single Family Home Lives with:: Parents Patient language and need for interpreter reviewed:: Yes Do you feel safe going back to the place where you live?: Yes      Need for Family Participation in Patient Care: No (Comment) Care giver support system in place?: Yes (comment)(mother)   Criminal Activity/Legal Involvement Pertinent to Current Situation/Hospitalization: No - Comment as needed  Activities of Daily Living Home Assistive Devices/Equipment: CBG Meter ADL Screening (condition at time of admission) Patient's cognitive ability adequate to safely complete daily activities?: Yes Is the patient deaf or have difficulty hearing?:  No Does the patient have difficulty seeing, even when wearing glasses/contacts?: No Does the patient have difficulty concentrating, remembering, or making decisions?: No Patient able to express need for assistance with ADLs?: Yes Does the patient have difficulty dressing or bathing?: No Independently performs ADLs?: Yes (appropriate for developmental age) Does the patient have difficulty walking or climbing stairs?: No Weakness of Legs: None Weakness of Arms/Hands: None  Permission Sought/Granted Permission sought to share information with : Case Manager Permission granted to share information with : Yes, Verbal Permission Granted              Emotional Assessment Appearance:: Appears stated age Attitude/Demeanor/Rapport: Engaged Affect (typically observed): Accepting Orientation: : Oriented to Self, Oriented to Place, Oriented to  Time, Oriented to Situation Alcohol / Substance Use: Not Applicable Psych Involvement: No (comment)  Admission diagnosis:  Diabetic ketoacidosis without coma associated with type 1 diabetes mellitus ([place]) [E10.10] DKA, type 1 ([place]) [E10.10] Patient Active Problem List   Diagnosis Date Noted  . DKA, type 1 ([place]) 08/23/2019  . Cellulitis of scrotum 07/18/2019  . Acute renal failure ([place])   . Tobacco use disorder 02/10/2018  . DKA (diabetic ketoacidoses) ([place]) 09/01/2016  . Sepsis ([place]) 10/02/2015  . Scrotal abscess   . Diabetes mellitus with ketoacidosis ([place]) 08/13/2012  . Type 1 diabetes mellitus ([place]) 02/06/2012   [facility]P:  [facility]:   [facility] [address]), [place] - [address] [place]) [place] [address] Phone: [phone] Fax: [phone]     Social Determinants of Health (SDOH) Interventions    Readmission Risk Interventions No flowsheet data found.

## 2019-08-25 NOTE — Progress Notes (Signed)
PROGRESS NOTE  Wesley [name] MWN:[id number] DOB: 04/29/1996 DOA: 08/23/2019 [facility]  HPI/Recap of past 24 hours: Wesley [name]  is a 23 y.o. male with a known history of type 1 diabetes mellitus, presented to the emergency room with onset of intractable nausea and vomiting since 4 PM today.  He denied any bilious vomitus or hematemesis.  He denied any diarrhea.  He admitted to polyuria and polydipsia.  He has been having mild epigastric abdominal pain earlier that has currently resolved as well as palpitations without chest pain.  No recent cough or wheezing or shortness of breath.  No fever or chills.  No recent sick exposures to COVID-19.  Upon presentation to the emergency room, heart rate was 125 and blood pressure 129/85, and later 144/88 then 111/67 with otherwise normal vital signs.  Labs revealed venous blood gas with pH of 7.3 and bicarbonate of 12.8.  CMP revealed a potassium of 5.2 and a blood glucose 257 with CO2 of 15 anion gap of 18.  High-sensitivity troponin I was 3 and CBC showed leukocytosis of 15.9.  Beta hydroxybutyrate was 7.03.  Urinalysis showed 30 protein more than 500 glucose with 80 ketones.  The patient was given 2 L bolus of IV normal saline and 4 mg IV Zofran, 25 mg of IV Phenergan and was started on IV insulin drip.  He will be admitted to a stepdown unit for further evaluation and management.  08/25/19: Seen and examined at his bedside. Reports persistent nausea and vomiting last night. Has not been able to tolerate oral intake. States has not eaten and kept food down since admitted. On IV antiemetics and IV fluid.   Assessment/Plan: Active Problems:   DKA, type 1 ([facility])  DKA type1 possibly triggered by acute gastroenteritis Self-reported symptoms came on suddenly after eating [employer] food Was in his usual state of health prior to this. Denies running out of insulin; denies fevers or chills at home Persistent nausea with  vomiting overnight Presented with serum glucose of 250, serum Bicarb 15 and anion gap of 18 Completed insulin drip and transitioned to long-acting insulin Continue insulin sliding scale, continue diet carb modified Increase IV fluids rate 100 cc/h since minimal oral intake. Continue to Monitor electrolytes Daily BMP  High anion gap metabolic acidosis likely secondary to DKA Presented with serum glucose of 250, serum Bicarb 15 and anion gap of 18 Improving Serum bicarb 20 and anion gap of 12 on 08/24/2019  Suspected viral gastroenteritis Leukocytosis resolving Presented with WBC 15.9K>>leukocytosis has resolved 9.0 on 08/25/19 C/w antiemetics as needed continue IV fluid hydration  Intractable nausea Management as stated above   Code Status: Full code   Family Communication: None at bedside   Disposition Plan: Likely dc in next 24 to 48 h when can tolerate oral intake   Consultants:  None  Procedures:  None   Antimicrobials:  None   DVT prophylaxis:  SQ lovenox daily   Objective: Vitals:   08/24/19 1450 08/24/19 1519 08/24/19 2029 08/25/19 0344  BP: 112/70 (!) 140/97 127/88 132/87  Pulse: 60 (!) 57 73 (!) 54  Resp: 16 17 13 13   Temp:  98.5 F (36.9 C) 98.9 F (37.2 C) 98.2 F (36.8 C)  TempSrc:  Oral Oral Oral  SpO2: 100% 100% 100% 100%  Weight:  73.7 kg    Height:  5\' 9"  (1.753 m)      Intake/Output Summary (Last 24 hours) at 08/25/2019 1402 Last data filed at  08/25/2019 0957 Gross per 24 hour  Intake 1915.17 ml  Output 325 ml  Net 1590.17 ml   Filed Weights   08/23/19 1646 08/24/19 1519  Weight: 75.6 kg 73.7 kg    Exam:  . General: 23 y.o. year-old male WD WN Appears uncomfortable due to weakness and intractable nasuea. . Cardiovascular: RRR no rubs or gallops . Respiratory: CTA no wheezes or rales . Abdomen:Soft NT ND BS present. . Musculoskeletal: Trace LE edema, 2/4 pulses in all 4 extremities . Psychiatry: Mood is appropriate for  condition and setting.   Data Reviewed: CBC: Recent Labs  Lab 08/23/19 1648 08/24/19 0454 08/25/19 0559  WBC 15.9* 14.9* 9.0  NEUTROABS  --  12.4* 6.3  HGB 16.5 13.6 12.9*  HCT 50.2 41.3 36.9*  MCV 89.8 88.6 84.4  PLT 287 253 203   Basic Metabolic Panel: Recent Labs  Lab 08/23/19 1735 08/24/19 0040 08/24/19 0454 08/24/19 1251 08/25/19 0559  NA 141 143 144 144 140  K 5.2* 3.9 3.9 3.5 4.2  CL 108 114* 114* 115* 109  CO2 15* 17* 20* 20* 20*  GLUCOSE 257* 109* 121* 149* 301*  BUN 20 15 14 11 11   CREATININE 1.28* 1.04 0.98 0.79 0.95  CALCIUM 9.6 9.1 9.2 8.5* 8.8*  MG  --   --   --   --  1.6*   GFR: Estimated Creatinine Clearance: 122 mL/min (by C-G formula based on SCr of 0.95 mg/dL). Liver Function Tests: Recent Labs  Lab 08/23/19 1735  AST 35  ALT 32  ALKPHOS 104  BILITOT 1.4*  PROT 7.4  ALBUMIN 4.6   No results for input(s): LIPASE, AMYLASE in the last 168 hours. No results for input(s): AMMONIA in the last 168 hours. Coagulation Profile: No results for input(s): INR, PROTIME in the last 168 hours. Cardiac Enzymes: No results for input(s): CKTOTAL, CKMB, CKMBINDEX, TROPONINI in the last 168 hours. BNP (last 3 results) No results for input(s): PROBNP in the last 8760 hours. HbA1C: Recent Labs    08/24/19 0454  HGBA1C 11.3*   CBG: Recent Labs  Lab 08/24/19 1250 08/24/19 1702 08/24/19 2126 08/25/19 0750 08/25/19 1229  GLUCAP 151* 120* 127* 297* 233*   Lipid Profile: No results for input(s): CHOL, HDL, LDLCALC, TRIG, CHOLHDL, LDLDIRECT in the last 72 hours. Thyroid Function Tests: No results for input(s): TSH, T4TOTAL, FREET4, T3FREE, THYROIDAB in the last 72 hours. Anemia Panel: No results for input(s): VITAMINB12, FOLATE, FERRITIN, TIBC, IRON, RETICCTPCT in the last 72 hours. Urine analysis:    Component Value Date/Time   COLORURINE STRAW (A) 08/23/2019 1648   APPEARANCEUR CLEAR (A) 08/23/2019 1648   APPEARANCEUR Clear 12/24/2011 1505    LABSPEC 1.025 08/23/2019 1648   LABSPEC 1.030 12/24/2011 1505   PHURINE 5.0 08/23/2019 1648   GLUCOSEU >=500 (A) 08/23/2019 1648   GLUCOSEU >=500 12/24/2011 1505   HGBUR NEGATIVE 08/23/2019 1648   BILIRUBINUR NEGATIVE 08/23/2019 1648   BILIRUBINUR Negative 12/24/2011 1505   KETONESUR 80 (A) 08/23/2019 1648   PROTEINUR 30 (A) 08/23/2019 1648   NITRITE NEGATIVE 08/23/2019 1648   LEUKOCYTESUR NEGATIVE 08/23/2019 1648   LEUKOCYTESUR Negative 12/24/2011 1505   Sepsis Labs: @LABRCNTIP (procalcitonin:4,lacticidven:4)  ) Recent Results (from the past 240 hour(s))  SARS CORONAVIRUS 2 (TAT 6-24 HRS) Nasopharyngeal Nasopharyngeal Swab     Status: None   Collection Time: 08/23/19  9:41 PM   Specimen: Nasopharyngeal Swab  Result Value Ref Range Status   SARS Coronavirus 2 NEGATIVE NEGATIVE Final  Comment: (NOTE) SARS-CoV-2 target nucleic acids are NOT DETECTED. The SARS-CoV-2 RNA is generally detectable in upper and lower respiratory specimens during the acute phase of infection. Negative results do not preclude SARS-CoV-2 infection, do not rule out co-infections with other pathogens, and should not be used as the sole basis for treatment or other patient management decisions. Negative results must be combined with clinical observations, patient history, and epidemiological information. The expected result is Negative. Fact Sheet for Patients: SugarRoll.be Fact Sheet for Healthcare Providers: https://www.woods-mathews.com/ This test is not yet approved or cleared by the [place] FDA and  has been authorized for detection and/or diagnosis of SARS-CoV-2 by FDA under an Emergency Use Authorization (EUA). This EUA will remain  in effect (meaning this test can be used) for the duration of the COVID-19 declaration under Section 56 4(b)(1) of the Act, 21 U.S.C. section 360bbb-3(b)(1), unless the authorization is terminated or revoked sooner.  Performed at [facility], [place] [address], [place], [place] [address]       Studies: No results found.  Scheduled Meds: . enoxaparin (LOVENOX) injection  40 mg Subcutaneous Q24H  . insulin aspart  0-5 Units Subcutaneous QHS  . insulin aspart  0-9 Units Subcutaneous TID WC  . insulin glargine  10 Units Subcutaneous Daily  . metoCLOPramide (REGLAN) injection  10 mg Intravenous Q6H    Continuous Infusions: . sodium chloride 100 mL/hr at 08/24/19 1745     LOS: 2 days     Kayleen Memos, MD [facility] Pager [phone]  If 7PM-7AM, please contact night-coverage [email or web address] Password [email or web address] 08/25/2019, 2:02 PM

## 2019-08-26 LAB — CBC WITH DIFFERENTIAL/PLATELET
Abs Immature Granulocytes: 0.01 10*3/uL (ref 0.00–0.07)
Basophils Absolute: 0 10*3/uL (ref 0.0–0.1)
Basophils Relative: 1 %
Eosinophils Absolute: 0 10*3/uL (ref 0.0–0.5)
Eosinophils Relative: 0 %
HCT: 36.2 % — ABNORMAL LOW (ref 39.0–52.0)
Hemoglobin: 12.3 g/dL — ABNORMAL LOW (ref 13.0–17.0)
Immature Granulocytes: 0 %
Lymphocytes Relative: 42 %
Lymphs Abs: 2.4 10*3/uL (ref 0.7–4.0)
MCH: 29.5 pg (ref 26.0–34.0)
MCHC: 34 g/dL (ref 30.0–36.0)
MCV: 86.8 fL (ref 80.0–100.0)
Monocytes Absolute: 0.4 10*3/uL (ref 0.1–1.0)
Monocytes Relative: 7 %
Neutro Abs: 2.9 10*3/uL (ref 1.7–7.7)
Neutrophils Relative %: 50 %
Platelets: 177 10*3/uL (ref 150–400)
RBC: 4.17 MIL/uL — ABNORMAL LOW (ref 4.22–5.81)
RDW: 13 % (ref 11.5–15.5)
WBC: 5.8 10*3/uL (ref 4.0–10.5)
nRBC: 0 % (ref 0.0–0.2)

## 2019-08-26 LAB — BASIC METABOLIC PANEL
Anion gap: 9 (ref 5–15)
BUN: 9 mg/dL (ref 6–20)
CO2: 21 mmol/L — ABNORMAL LOW (ref 22–32)
Calcium: 8.5 mg/dL — ABNORMAL LOW (ref 8.9–10.3)
Chloride: 108 mmol/L (ref 98–111)
Creatinine, Ser: 0.79 mg/dL (ref 0.61–1.24)
GFR calc Af Amer: >60 mL/min (ref >60)
GFR calc non Af Amer: >60 mL/min (ref >60)
Glucose, Bld: 263 mg/dL — ABNORMAL HIGH (ref 70–99)
Potassium: 3.9 mmol/L (ref 3.5–5.1)
Sodium: 138 mmol/L (ref 135–145)

## 2019-08-26 LAB — GLUCOSE, CAPILLARY
Glucose-Capillary: 261 mg/dL — ABNORMAL HIGH (ref 70–99)
Glucose-Capillary: 111 mg/dL — ABNORMAL HIGH (ref 70–99)

## 2019-08-26 LAB — MAGNESIUM: Magnesium: 1.6 mg/dL — ABNORMAL LOW (ref 1.7–2.4)

## 2019-08-26 MED ORDER — MAGNESIUM SULFATE 4 GM/100ML IV SOLN
4.0000 g | Freq: Once | INTRAVENOUS | Status: AC
  Administered 2019-08-26: 10:00:00 4 g via INTRAVENOUS
  Filled 2019-08-26: qty 100

## 2019-08-26 NOTE — Discharge Summary (Signed)
Discharge Summary  Wesley [name] ERX:[id number] DOB: April 28, 1996  PCP: [facility], [place] date: 08/23/2019 Discharge date: 08/26/2019  Time spent: 35 minutes  Recommendations for Outpatient Follow-up:  1. Follow-up with your PCP 2. Take your medications as prescribed.  Discharge Diagnoses:  Active Hospital Problems   Diagnosis Date Noted  . DKA, type 1 ([place]) 08/23/2019    Resolved Hospital Problems  No resolved problems to display.    Discharge Condition: Stable  Diet recommendation: Resume current diet, carb modified diabetic diet.  Vitals:   08/25/19 1941 08/26/19 0500  BP: 120/72 129/86  Pulse: 64 61  Resp: 13 14  Temp: 99.6 F (37.6 C) 98.8 F (37.1 C)  SpO2: 100% 98%    History of present illness:  [place] a23 y.o.malewith a known history of type 1 diabetes mellitus, presented to the emergency room with onset of intractable nausea and vomitingsince 4 PM today. He denied any bilious vomitus or hematemesis. He denied any diarrhea. He admitted to polyuria and polydipsia. He has been having mild epigastric abdominal pain earlier that has currently resolved as well as palpitations without chest pain. No recent cough or wheezing or shortness of breath. No fever or chills. No recent sick exposures to COVID-19.  Upon presentation to the emergency room, heart rate was 125 and blood pressure 129/85, and later 144/88 then 111/67 with otherwise normal vital signs. Labs revealed venous blood gas with pH of 7.3 and bicarbonate of 12.8. CMP revealed a potassium of 5.2 and a blood glucose 257 with CO2 of 15 anion gap of 18. High-sensitivity troponin I was 3 and CBC showed leukocytosis of 15.9. Beta hydroxybutyrate was 7.03. Urinalysis showed 30 protein more than 500 glucose with 80 ketones.  The patient was given 2 L bolus of IV normal saline and 4 mg IV Zofran, 25 mg of IV Phenergan and was started on IV insulin drip. He will  be admitted to a stepdown unit for further evaluation and management.  Hospital course complicated by persistent nausea and vomiting which are now resolved.  Was able to tolerate a regular consistency diet yesterday.  He feels better today and wants to go home.    Vital signs and labs reviewed and are stable.  On the day of discharge, the patient was hemodynamically stable.  He will need to follow-up with his primary care provider posthospitalization and take his medications as prescribed.   Hospital Course:  Active Problems:   DKA, type 1 ([place])  DKA type1 possibly triggered by acute gastroenteritis Self-reported symptoms came on suddenly after eating [employer] food Was in his usual state of health prior to this. Denies running out of insulin; denies fevers or chills at home Presented with serum glucose of 250, serum Bicarb 15 and anion gap of 18 Completed insulin drip and transitioned to long-acting insulin Continue carb modified diabetic diet Received IV fluids  Hypomagnesemia Magnesium 1.6 on 08/26/2019 Repleted with IV magnesium 4 g once.  Resolved high anion gap metabolic acidosis likely secondary to DKA post isotonic bicarb drip. Presented with serum glucose of 250, serum Bicarb 15 and anion gap of 18 Chemistry bicarb 21 and anion gap of 9 on 08/26/2019.  Resolving viral gastroenteritis Leukocytosis  resolved, WBC 5.8 on 08/26/2019. Presented with WBC 15.9K  Resolved intractable nausea   Code Status: Full code   Consultants:  None  Procedures:  None   Antimicrobials:  None      Discharge Exam: BP 129/86 (BP Location: Right Arm)  Pulse 61   Temp 98.8 F (37.1 C) (Oral)   Resp 14   Ht '5\' 9"'  (1.753 m)   Wt 73.7 kg   SpO2 98%   BMI 23.98 kg/m  . General: 23 y.o. year-old male well developed well nourished in no acute distress.  Alert and oriented x4. . Cardiovascular: Regular rate and rhythm with no rubs or gallops.  No thyromegaly or JVD  noted.   Marland Kitchen Respiratory: Clear to auscultation with no wheezes or rales. Good inspiratory effort. . Abdomen: Soft nontender nondistended with normal bowel sounds x4 quadrants. . Musculoskeletal: No lower extremity edema. 2/4 pulses in all 4 extremities. Marland Kitchen Psychiatry: Mood is appropriate for condition and setting  Discharge Instructions You were cared for by a hospitalist during your hospital stay. If you have any questions about your discharge medications or the care you received while you were in the hospital after you are discharged, you can call the unit and asked to speak with the hospitalist on call if the hospitalist that took care of you is not available. Once you are discharged, your primary care physician will handle any further medical issues. Please note that NO REFILLS for any discharge medications will be authorized once you are discharged, as it is imperative that you return to your primary care physician (or establish a relationship with a primary care physician if you do not have one) for your aftercare needs so that they can reassess your need for medications and monitor your lab values.   Allergies as of 08/26/2019   No Known Allergies     Medication List    STOP taking these medications   oxyCODONE-acetaminophen 5-325 MG tablet Commonly known as: PERCOCET/ROXICET   sulfamethoxazole-trimethoprim 800-160 MG tablet Commonly known as: BACTRIM DS     TAKE these medications   blood glucose meter kit and supplies Kit Dispense based on patient and insurance preference. Use up to four times daily as directed. (FOR ICD-9 250.00, 250.01).   glucose blood test strip Use as instructed   insulin NPH-regular Human (70-30) 100 UNIT/ML injection Inject 40 Units into the skin every morning.   insulin regular 100 units/mL injection Commonly known as: NOVOLIN R Inject 0.01-0.1 mLs (1-10 Units total) into the skin 3 (three) times daily before meals. Per sliding scale   Insulin  Syringes (Disposable) U-100 0.5 ML Misc 40 Units by Does not apply route 3 (three) times daily.   onetouch ultrasoft lancets Please use as prescribed      No Known Allergies Follow-up [place], [facility]. Call in 1 day(s).   Specialty: General Practice Why: Please call for a post hospital follow-up appointment Contact information: [address]. [place] [address] [phone]            The results of significant diagnostics from this hospitalization (including imaging, microbiology, ancillary and laboratory) are listed below for reference.    Significant Diagnostic Studies: No results found.  Microbiology: Recent Results (from the past 240 hour(s))  SARS CORONAVIRUS 2 (TAT 6-24 HRS) Nasopharyngeal Nasopharyngeal Swab     Status: None   Collection Time: 08/23/19  9:41 PM   Specimen: Nasopharyngeal Swab  Result Value Ref Range Status   SARS Coronavirus 2 NEGATIVE NEGATIVE Final    Comment: (NOTE) SARS-CoV-2 target nucleic acids are NOT DETECTED. The SARS-CoV-2 RNA is generally detectable in upper and lower respiratory specimens during the acute phase of infection. Negative results do not preclude SARS-CoV-2 infection, do  not rule out co-infections with other pathogens, and should not be used as the sole basis for treatment or other patient management decisions. Negative results must be combined with clinical observations, patient history, and epidemiological information. The expected result is Negative. Fact Sheet for Patients: SugarRoll.be Fact Sheet for Healthcare Providers: https://www.woods-mathews.com/ This test is not yet approved or cleared by the [place] FDA and  has been authorized for detection and/or diagnosis of SARS-CoV-2 by FDA under an Emergency Use Authorization (EUA). This EUA will remain  in effect (meaning this test can be used) for the duration of  the COVID-19 declaration under Section 56 4(b)(1) of the Act, 21 U.S.C. section 360bbb-3(b)(1), unless the authorization is terminated or revoked sooner. Performed at [facility], [place] [address] [place], [place] [address]      Labs: Basic Metabolic Panel: Recent Labs  Lab 08/24/19 0040 08/24/19 0454 08/24/19 1251 08/25/19 0559 08/26/19 0543  NA 143 144 144 140 138  K 3.9 3.9 3.5 4.2 3.9  CL 114* 114* 115* 109 108  CO2 17* 20* 20* 20* 21*  GLUCOSE 109* 121* 149* 301* 263*  BUN '15 14 11 11 9  ' CREATININE 1.04 0.98 0.79 0.95 0.79  CALCIUM 9.1 9.2 8.5* 8.8* 8.5*  MG  --   --   --  1.6* 1.6*   Liver Function Tests: Recent Labs  Lab 08/23/19 1735  AST 35  ALT 32  ALKPHOS 104  BILITOT 1.4*  PROT 7.4  ALBUMIN 4.6   No results for input(s): LIPASE, AMYLASE in the last 168 hours. No results for input(s): AMMONIA in the last 168 hours. CBC: Recent Labs  Lab 08/23/19 1648 08/24/19 0454 08/25/19 0559 08/26/19 0543  WBC 15.9* 14.9* 9.0 5.8  NEUTROABS  --  12.4* 6.3 2.9  HGB 16.5 13.6 12.9* 12.3*  HCT 50.2 41.3 36.9* 36.2*  MCV 89.8 88.6 84.4 86.8  PLT 287 253 203 177   Cardiac Enzymes: No results for input(s): CKTOTAL, CKMB, CKMBINDEX, TROPONINI in the last 168 hours. BNP: BNP (last 3 results) No results for input(s): BNP in the last 8760 hours.  ProBNP (last 3 results) No results for input(s): PROBNP in the last 8760 hours.  CBG: Recent Labs  Lab 08/24/19 2126 08/25/19 0750 08/25/19 1229 08/25/19 1633 08/25/19 2057  GLUCAP 127* 297* 233* 136* 111*       Signed:  Kayleen Memos, MD [facility] 08/26/2019, 10:03 AM

## 2019-08-26 NOTE — Progress Notes (Signed)
Patient discharged home per MD order. All discharge instructions given and all questions answered. 

## 2019-09-05 NOTE — Progress Notes (Deleted)
09/06/2019 3:46 PM   Wesley [name] 1996/05/20 [id number]  Referring provider: [facility], [place],  [place] [address]  No chief complaint on file.   HPI: Mr. Wesley [name] is a 23 year old male with a history of scrotal abscess who presents today for recheck.  He underwent I & D of a left scrotal abscess with Dr. Bernardo Heater on 07/15/2019.  Wound culture preliminary results with abundant WBCs, abundant Gram-positive cocci, and few Gram-negative rods.  PMH: Past Medical History:  Diagnosis Date  . Diabetes mellitus without complication ([place])    type 1 diabetes    Surgical History: Past Surgical History:  Procedure Laterality Date  . HAND SURGERY    . INCISION AND DRAINAGE ABSCESS N/A 10/02/2015   Procedure: INCISION AND DRAINAGE ABSCESS;  Surgeon: Festus Aloe, MD;  Location: [facility] ORS;  Service: Urology;  Laterality: N/A;  . INCISION AND DRAINAGE ABSCESS N/A 07/18/2019   Procedure: INCISION AND DRAINAGE SCROTAL ABSCESS;  Surgeon: Abbie Sons, MD;  Location: [facility] ORS;  Service: Urology;  Laterality: N/A;  . KNEE ARTHROSCOPY WITH MEDIAL MENISECTOMY Right 03/17/2019   Procedure: KNEE ARTHROSCOPY WITH MEDIAL MENISECTOMY, POSSIBLE CHONDROPLASTY RIGHT - DIABETIC;  Surgeon: Leim Fabry, MD;  Location: [facility] ORS;  Service: Orthopedics;  Laterality: Right;  . TONSILLECTOMY    . TONSILLECTOMY AND ADENOIDECTOMY    . UMBILICAL HERNIA REPAIR      Home Medications:  Allergies as of 09/06/2019   No Known Allergies     Medication List       Accurate as of September 05, 2019  3:46 PM. If you have any questions, ask your nurse or doctor.        blood glucose meter kit and supplies Kit Dispense based on patient and insurance preference. Use up to four times daily as directed. (FOR ICD-9 250.00, 250.01).   glucose blood test strip Use as instructed   insulin NPH-regular Human (70-30) 100 UNIT/ML injection Inject 40 Units into  the skin every morning.   insulin regular 100 units/mL injection Commonly known as: NOVOLIN R Inject 0.01-0.1 mLs (1-10 Units total) into the skin 3 (three) times daily before meals. Per sliding scale   Insulin Syringes (Disposable) U-100 0.5 ML Misc 40 Units by Does not apply route 3 (three) times daily.   onetouch ultrasoft lancets Please use as prescribed       Allergies: No Known Allergies  Family History: Family History  Problem Relation Age of Onset  . Diabetes Mellitus II Paternal Grandfather   . Hypertension Mother     Social History:  reports that he has quit smoking. His smoking use included cigarettes. He has a 2.00 pack-year smoking history. He has never used smokeless tobacco. He reports current alcohol use. He reports current drug use. Drug: Marijuana.  ROS:                                        Physical Exam: There were no vitals taken for this visit.  Constitutional:  Well nourished. Alert and oriented, No acute distress. HEENT: [place] AT, moist mucus membranes.  Trachea midline, no masses. Cardiovascular: No clubbing, cyanosis, or edema. Respiratory: Normal respiratory effort, no increased work of breathing. GI: Abdomen is soft, non tender, non distended, no abdominal masses. Liver and spleen not palpable.  No hernias appreciated.  Stool sample for occult testing is  not indicated.   GU: No CVA tenderness.  No bladder fullness or masses.  Patient with circumcised/uncircumcised phallus. ***Foreskin easily retracted***  Urethral meatus is patent.  No penile discharge. No penile lesions or rashes. Scrotum without lesions, cysts, rashes and/or edema.  Testicles are located scrotally bilaterally. No masses are appreciated in the testicles. Left and right epididymis are normal. Rectal: Patient with  normal sphincter tone. Anus and perineum without scarring or rashes. No rectal masses are appreciated. Prostate is approximately *** grams, *** nodules  are appreciated. Seminal vesicles are normal. Skin: No rashes, bruises or suspicious lesions. Lymph: No cervical or inguinal adenopathy. Neurologic: Grossly intact, no focal deficits, moving all 4 extremities. Psychiatric: Normal mood and affect.  Laboratory Data: Lab Results  Component Value Date   WBC 5.8 08/26/2019   HGB 12.3 (L) 08/26/2019   HCT 36.2 (L) 08/26/2019   MCV 86.8 08/26/2019   PLT 177 08/26/2019    Lab Results  Component Value Date   CREATININE 0.79 08/26/2019    No results found for: PSA  No results found for: TESTOSTERONE  Lab Results  Component Value Date   HGBA1C 11.3 (H) 08/24/2019    Lab Results  Component Value Date   TSH 3.655 07/18/2019    No results found for: CHOL, HDL, CHOLHDL, VLDL, LDLCALC  Lab Results  Component Value Date   AST 35 08/23/2019   Lab Results  Component Value Date   ALT 32 08/23/2019   No components found for: ALKALINEPHOPHATASE No components found for: BILIRUBINTOTAL  No results found for: ESTRADIOL  Urinalysis    Component Value Date/Time   COLORURINE STRAW (A) 08/23/2019 1648   APPEARANCEUR CLEAR (A) 08/23/2019 1648   APPEARANCEUR Clear 12/24/2011 1505   LABSPEC 1.025 08/23/2019 1648   LABSPEC 1.030 12/24/2011 1505   PHURINE 5.0 08/23/2019 1648   GLUCOSEU >=500 (A) 08/23/2019 1648   GLUCOSEU >=500 12/24/2011 1505   HGBUR NEGATIVE 08/23/2019 1648   BILIRUBINUR NEGATIVE 08/23/2019 1648   BILIRUBINUR Negative 12/24/2011 1505   KETONESUR 80 (A) 08/23/2019 1648   PROTEINUR 30 (A) 08/23/2019 1648   NITRITE NEGATIVE 08/23/2019 1648   LEUKOCYTESUR NEGATIVE 08/23/2019 1648   LEUKOCYTESUR Negative 12/24/2011 1505    I have reviewed the labs.   Pertinent Imaging: *** I have independently reviewed the films.    Assessment & Plan:  ***  1. Scrotal abscess ***  No follow-ups on file.  These notes generated with voice recognition software. I apologize for typographical errors.  Zara Council,  PA-C  [facility] [address]  [place], [place] [address] [phone]

## 2019-09-06 ENCOUNTER — Encounter: Payer: Self-pay | Admitting: Urology

## 2019-09-06 ENCOUNTER — Ambulatory Visit: Payer: BC Managed Care – PPO | Admitting: Urology

## 2019-09-27 ENCOUNTER — Ambulatory Visit: Payer: Self-pay

## 2019-10-05 ENCOUNTER — Emergency Department
Admission: EM | Admit: 2019-10-05 | Discharge: 2019-10-05 | Disposition: A | Discharge status: Home / Self Care | Payer: BC Managed Care – PPO | Attending: Emergency Medicine | Admitting: Emergency Medicine

## 2019-10-05 ENCOUNTER — Other Ambulatory Visit: Payer: Self-pay

## 2019-10-05 ENCOUNTER — Encounter: Payer: Self-pay | Admitting: Emergency Medicine

## 2019-10-05 DIAGNOSIS — Z7689 Persons encountering health services in other specified circumstances: Secondary | ICD-10-CM

## 2019-10-05 DIAGNOSIS — N342 Other urethritis: Secondary | ICD-10-CM

## 2019-10-05 DIAGNOSIS — Z202 Contact with and (suspected) exposure to infections with a predominantly sexual mode of transmission: Secondary | ICD-10-CM | POA: Insufficient documentation

## 2019-10-05 DIAGNOSIS — Z794 Long term (current) use of insulin: Secondary | ICD-10-CM | POA: Insufficient documentation

## 2019-10-05 DIAGNOSIS — E109 Type 1 diabetes mellitus without complications: Secondary | ICD-10-CM | POA: Diagnosis not present

## 2019-10-05 DIAGNOSIS — Z87891 Personal history of nicotine dependence: Secondary | ICD-10-CM | POA: Diagnosis not present

## 2019-10-05 DIAGNOSIS — N341 Nonspecific urethritis: Secondary | ICD-10-CM | POA: Diagnosis not present

## 2019-10-05 LAB — URINALYSIS, COMPLETE (UACMP) WITH MICROSCOPIC
Bacteria, UA: NONE SEEN
Bilirubin Urine: NEGATIVE
Glucose, UA: NEGATIVE mg/dL
Hgb urine dipstick: NEGATIVE
Ketones, ur: 5 mg/dL — AB
Nitrite: NEGATIVE
Protein, ur: 30 mg/dL — AB
Specific Gravity, Urine: 1.031 — ABNORMAL HIGH (ref 1.005–1.030)
Squamous Epithelial / HPF: NONE SEEN (ref 0–5)
WBC, UA: >50 WBC/hpf — ABNORMAL HIGH (ref 0–5)
pH: 6 (ref 5.0–8.0)

## 2019-10-05 MED ORDER — METRONIDAZOLE 500 MG PO TABS: 2000.0000 mg | ORAL_TABLET | Freq: Once | ORAL | 0 refills | Status: AC

## 2019-10-05 MED ORDER — AZITHROMYCIN 500 MG PO TABS
1000.0000 mg | ORAL_TABLET | Freq: Once | ORAL | Status: AC
  Administered 2019-10-05: 1000 mg via ORAL
  Filled 2019-10-05: qty 2

## 2019-10-05 MED ORDER — CEFTRIAXONE SODIUM 250 MG IJ SOLR
250.0000 mg | Freq: Once | INTRAMUSCULAR | Status: AC
  Administered 2019-10-05: 250 mg via INTRAMUSCULAR
  Filled 2019-10-05: qty 250

## 2019-10-05 NOTE — ED Provider Notes (Signed)
[facility] Emergency Department Provider Note ____________________________________________  Time seen: 1818  I have reviewed the triage vital signs and the nursing notes.  HISTORY  Chief Complaint  Penile Discharge and Dysuria   HPI Wesley [name] is a 23 y.o. male presents to the ED for evaluation of penile discharge and dysuria.   He reports nonproductive encounter with his girlfriend, but notes that she was tested and has been reported as negative.  He denies any fevers, flank pain, or penile lesions.  Past Medical History:  Diagnosis Date  . Diabetes mellitus without complication ([place])    type 1 diabetes    Patient Active Problem List   Diagnosis Date Noted  . DKA, type 1 ([place]) 08/23/2019  . Cellulitis of scrotum 07/18/2019  . Acute renal failure ([place])   . Tobacco use disorder 02/10/2018  . DKA (diabetic ketoacidoses) ([place]) 09/01/2016  . Sepsis ([place]) 10/02/2015  . Scrotal abscess   . Diabetes mellitus with ketoacidosis ([place]) 08/13/2012  . Type 1 diabetes mellitus ([place]) 02/06/2012    Past Surgical History:  Procedure Laterality Date  . HAND SURGERY    . INCISION AND DRAINAGE ABSCESS N/A 10/02/2015   Procedure: INCISION AND DRAINAGE ABSCESS;  Surgeon: Festus Aloe, MD;  Location: [facility] ORS;  Service: Urology;  Laterality: N/A;  . INCISION AND DRAINAGE ABSCESS N/A 07/18/2019   Procedure: INCISION AND DRAINAGE SCROTAL ABSCESS;  Surgeon: Abbie Sons, MD;  Location: [facility] ORS;  Service: Urology;  Laterality: N/A;  . KNEE ARTHROSCOPY WITH MEDIAL MENISECTOMY Right 03/17/2019   Procedure: KNEE ARTHROSCOPY WITH MEDIAL MENISECTOMY, POSSIBLE CHONDROPLASTY RIGHT - DIABETIC;  Surgeon: Leim Fabry, MD;  Location: [facility] ORS;  Service: Orthopedics;  Laterality: Right;  . TONSILLECTOMY    . TONSILLECTOMY AND ADENOIDECTOMY    . UMBILICAL HERNIA REPAIR      Prior to Admission medications   Medication Sig Start Date End Date Taking? Authorizing Provider   blood glucose meter kit and supplies KIT Dispense based on patient and insurance preference. Use up to four times daily as directed. (FOR ICD-9 250.00, 250.01). 08/24/19   Irene Pap N, DO  glucose blood test strip Use as instructed 08/24/19   Kayleen Memos, DO  insulin NPH-regular Human (70-30) 100 UNIT/ML injection Inject 40 Units into the skin every morning. 08/24/19   Kayleen Memos, DO  insulin regular (NOVOLIN R) 100 units/mL injection Inject 0.01-0.1 mLs (1-10 Units total) into the skin 3 (three) times daily before meals. Per sliding scale 08/24/19   Kayleen Memos, DO  Insulin Syringes, Disposable, U-100 0.5 ML MISC 40 Units by Does not apply route 3 (three) times daily. 08/24/19   Kayleen Memos, DO  Lancets [facility] ULTRASOFT) lancets Please use as prescribed 08/24/19   Kayleen Memos, DO  metroNIDAZOLE (FLAGYL) 500 MG tablet Take 4 tablets (2,000 mg total) by mouth once for 1 dose. 10/05/19 10/05/19  Kahliya Fraleigh, Dannielle Karvonen, PA-C    Allergies Patient has no known allergies.  Family History  Problem Relation Age of Onset  . Diabetes Mellitus II Paternal Grandfather   . Hypertension Mother     Social History Social History   Tobacco Use  . Smoking status: Former Smoker    Packs/day: 0.50    Years: 4.00    Pack years: 2.00    Types: Cigarettes  . Smokeless tobacco: Never Used  Substance Use Topics  . Alcohol use: Yes    Comment: occasional  . Drug use: Yes  Types: Marijuana    Comment: PT DENIES THIS DURING PREOP INTERVIEW ON 6-1 BUT IN DR PATELS H&P IT STATES HE DOES MARIJUANA    Review of Systems  Constitutional: Negative for fever. Cardiovascular: Negative for chest pain. Respiratory: Negative for shortness of breath. Gastrointestinal: Negative for abdominal pain, vomiting and diarrhea. Genitourinary: Positive for dysuria and penile discharge. Musculoskeletal: Negative for back pain. Skin: Negative for rash. Neurological: Negative for headaches, focal  weakness or numbness. ____________________________________________  PHYSICAL EXAM:  VITAL SIGNS: ED Triage Vitals  Enc Vitals Group     BP 10/05/19 1750 (!) 149/82     Pulse Rate 10/05/19 1750 (!) 104     Resp --      Temp 10/05/19 1750 98.4 F (36.9 C)     Temp Source 10/05/19 1750 Oral     SpO2 10/05/19 1750 94 %     Weight --      Height --      Head Circumference --      Peak Flow --      Pain Score 10/05/19 1728 3     Pain Loc --      Pain Edu? --      Excl. in [place]? --     Constitutional: Alert and oriented. Well appearing and in no distress. Head: Normocephalic and atraumatic. Eyes: Conjunctivae are normal. Normal extraocular movements Cardiovascular: Normal rate, regular rhythm. Normal distal pulses. Respiratory: Normal respiratory effort. No wheezes/rales/rhonchi. Gastrointestinal: Soft and nontender. No distention. GU: deferred Musculoskeletal: Nontender with normal range of motion in all extremities.  Neurologic:  Normal gait without ataxia. Normal speech and language. No gross focal neurologic deficits are appreciated. Skin:  Skin is warm, dry and intact. No rash noted. Psychiatric: Mood and affect are normal. Patient exhibits appropriate insight and judgment. ____________________________________________   LABS (pertinent positives/negatives) Labs Reviewed  URINALYSIS, COMPLETE (UACMP) WITH MICROSCOPIC - Abnormal; Notable for the following components:      Result Value   Color, Urine YELLOW (*)    APPearance HAZY (*)    Specific Gravity, Urine 1.031 (*)    Ketones, ur 5 (*)    Protein, ur 30 (*)    Leukocytes,Ua MODERATE (*)    WBC, UA >50 (*)    All other components within normal limits  GC/CHLAMYDIA PROBE AMP  ____________________________________________  PROCEDURES  Azithromycin 500 mg PO Rocephin 250 mg IM Procedures ____________________________________________  INITIAL IMPRESSION / ASSESSMENT AND PLAN / ED COURSE  Patient with ED evaluation  of symptoms of dysuria and penile discharge.  Patient will be treated empirically for GC urethritis.  He is also being discharged with a prescription for Flagyl for empiric treatment of trichomoniasis.  Patient is advised to avoid any sexual contact until he with his partners have been treated and are symptom-free.  He will follow-up with primary provider or department for further testing.  Wesley [name] was evaluated in Emergency Department on 10/05/2019 for the symptoms described in the history of present illness. He was evaluated in the context of the global COVID-19 pandemic, which necessitated consideration that the patient might be at risk for infection with the SARS-CoV-2 virus that causes COVID-19. Institutional protocols and algorithms that pertain to the evaluation of patients at risk for COVID-19 are in a state of rapid change based on information released by regulatory bodies including the CDC and federal and state organizations. These policies and algorithms were followed during the patient's care in the ED. ____________________________________________  FINAL CLINICAL IMPRESSION(S) / ED DIAGNOSES  Final diagnoses:  Urethritis  Encounter for assessment of STD exposure      Verdun Rackley, Dannielle Karvonen, PA-C 10/05/19 1856    Blake Divine, MD 10/07/19 [phone]

## 2019-10-05 NOTE — ED Triage Notes (Signed)
Pt reports white discharge from penis and dysuria that started today. Pt concerned he has a UTI.

## 2019-10-05 NOTE — ED Notes (Signed)
See triage note. Pt alert and resting calmly in bed. Pt on his phone.

## 2019-10-05 NOTE — Discharge Instructions (Signed)
You are being tested and treated for 3 common STDs. You have been given a single dose of 2 antibiotics in the ED. You will be given a prescription for the 3 antibiotic, take it as directed. Avoid any sexual contact until you and your partner(s) have been treated and are symptom-free. Follow-up with the Health Department for further testing.

## 2019-10-12 ENCOUNTER — Telehealth: Payer: Self-pay | Admitting: Emergency Medicine

## 2019-10-12 NOTE — Telephone Encounter (Signed)
Called patient to inform of std results and need for partner treatment.  Patient was treated in the ED.  I left message on home and mobile numbers.

## 2019-10-13 NOTE — Telephone Encounter (Signed)
Called patient and informed of positive gonorrhea and need for partner treatment.

## 2019-11-15 ENCOUNTER — Other Ambulatory Visit: Payer: Self-pay

## 2019-11-15 DIAGNOSIS — E109 Type 1 diabetes mellitus without complications: Secondary | ICD-10-CM | POA: Insufficient documentation

## 2019-11-15 DIAGNOSIS — R1013 Epigastric pain: Secondary | ICD-10-CM | POA: Insufficient documentation

## 2019-11-15 DIAGNOSIS — Z87891 Personal history of nicotine dependence: Secondary | ICD-10-CM | POA: Insufficient documentation

## 2019-11-15 DIAGNOSIS — R112 Nausea with vomiting, unspecified: Secondary | ICD-10-CM | POA: Insufficient documentation

## 2019-11-15 DIAGNOSIS — Z794 Long term (current) use of insulin: Secondary | ICD-10-CM | POA: Insufficient documentation

## 2019-11-15 LAB — COMPREHENSIVE METABOLIC PANEL WITH GFR
ALT: 25 U/L (ref 0–44)
AST: 26 U/L (ref 15–41)
Albumin: 3.9 g/dL (ref 3.5–5.0)
Alkaline Phosphatase: 131 U/L — ABNORMAL HIGH (ref 38–126)
Anion gap: 13 (ref 5–15)
BUN: 9 mg/dL (ref 6–20)
CO2: 25 mmol/L (ref 22–32)
Calcium: 10.2 mg/dL (ref 8.9–10.3)
Chloride: 102 mmol/L (ref 98–111)
Creatinine, Ser: 0.99 mg/dL (ref 0.61–1.24)
GFR calc Af Amer: >60 mL/min
GFR calc non Af Amer: >60 mL/min
Glucose, Bld: 129 mg/dL — ABNORMAL HIGH (ref 70–99)
Potassium: 3.4 mmol/L — ABNORMAL LOW (ref 3.5–5.1)
Sodium: 140 mmol/L (ref 135–145)
Total Bilirubin: 1 mg/dL (ref 0.3–1.2)
Total Protein: 7.7 g/dL (ref 6.5–8.1)

## 2019-11-15 LAB — CBC
HCT: 45.1 % (ref 39.0–52.0)
Hemoglobin: 15.8 g/dL (ref 13.0–17.0)
MCH: 30.2 pg (ref 26.0–34.0)
MCHC: 35 g/dL (ref 30.0–36.0)
MCV: 86.2 fL (ref 80.0–100.0)
Platelets: 337 K/uL (ref 150–400)
RBC: 5.23 MIL/uL (ref 4.22–5.81)
RDW: 13.1 % (ref 11.5–15.5)
WBC: 8.9 K/uL (ref 4.0–10.5)
nRBC: 0 % (ref 0.0–0.2)

## 2019-11-15 LAB — LIPASE, BLOOD: Lipase: 15 U/L (ref 11–51)

## 2019-11-15 LAB — GLUCOSE, CAPILLARY: Glucose-Capillary: 122 mg/dL — ABNORMAL HIGH (ref 70–99)

## 2019-11-15 MED ORDER — ONDANSETRON 4 MG PO TBDP
ORAL_TABLET | ORAL | Status: AC
  Filled 2019-11-15: qty 1

## 2019-11-15 MED ORDER — ONDANSETRON 4 MG PO TBDP
4.0000 mg | ORAL_TABLET | Freq: Once | ORAL | Status: AC
  Administered 2019-11-15: 4 mg via ORAL

## 2019-11-15 NOTE — ED Triage Notes (Signed)
Pt in with co vomiting since last night, states has kept small of fluids down. No diarrhea or dysuria, co mid abd pain. States is a diabetic and cbg at home have been normal.

## 2019-11-16 ENCOUNTER — Emergency Department
Admission: EM | Admit: 2019-11-16 | Discharge: 2019-11-16 | Disposition: A | Discharge status: Home / Self Care | Payer: BC Managed Care – PPO | Attending: Emergency Medicine | Admitting: Emergency Medicine

## 2019-11-16 DIAGNOSIS — R112 Nausea with vomiting, unspecified: Secondary | ICD-10-CM

## 2019-11-16 LAB — URINALYSIS, COMPLETE (UACMP) WITH MICROSCOPIC
Bacteria, UA: NONE SEEN
Bilirubin Urine: NEGATIVE
Glucose, UA: 50 mg/dL — AB
Hgb urine dipstick: NEGATIVE
Ketones, ur: 20 mg/dL — AB
Nitrite: NEGATIVE
Protein, ur: 100 mg/dL — AB
Specific Gravity, Urine: 1.033 — ABNORMAL HIGH (ref 1.005–1.030)
pH: 6 (ref 5.0–8.0)

## 2019-11-16 MED ORDER — SODIUM CHLORIDE 0.9 % IV BOLUS: 1000.0000 mL | Freq: Once | INTRAVENOUS | Status: DC

## 2019-11-16 MED ORDER — ONDANSETRON 4 MG PO TBDP
4.0000 mg | ORAL_TABLET | Freq: Once | ORAL | Status: AC
  Administered 2019-11-16: 4 mg via ORAL

## 2019-11-16 MED ORDER — ONDANSETRON 4 MG PO TBDP: 4.0000 mg | ORAL_TABLET | Freq: Three times a day (TID) | ORAL | 0 refills | Status: DC | PRN

## 2019-11-16 MED ORDER — ONDANSETRON 4 MG PO TBDP
ORAL_TABLET | ORAL | Status: AC
  Filled 2019-11-16: qty 1

## 2019-11-16 NOTE — ED Notes (Signed)
Pt provided with gingerale for PO challenge  

## 2019-11-16 NOTE — ED Provider Notes (Signed)
[facility] Emergency Department Provider Note  ____________________________________________  Time seen: Approximately 3:20 AM  I have reviewed the triage vital signs and the nursing notes.   HISTORY  Chief Complaint Emesis   HPI Wesley [name] is a 24 y.o. male with a history of type 1 diabetes who presents for evaluation of nausea and vomiting.  Patient reports the symptoms have been ongoing for 24 hours.  Has had several episodes of nonbloody nonbilious emesis.  Has been unable to keep anything down.  Patient has had episodes like this in the past due to his diabetes.   He reports that he has been able to keep small amount of fluids down.  No diarrhea, no constipation, no fever, no cough, no congestion, no dysuria or hematuria.  Patient reports initially had some epigastric discomfort associated with the vomiting but that has resolved.  He denies any abdominal pain at this time.  Past Medical History:  Diagnosis Date  . Diabetes mellitus without complication ([place])    type 1 diabetes    Patient Active Problem List   Diagnosis Date Noted  . DKA, type 1 ([place]) 08/23/2019  . Cellulitis of scrotum 07/18/2019  . Acute renal failure ([place])   . Tobacco use disorder 02/10/2018  . DKA (diabetic ketoacidoses) ([place]) 09/01/2016  . Sepsis ([place]) 10/02/2015  . Scrotal abscess   . Diabetes mellitus with ketoacidosis ([place]) 08/13/2012  . Type 1 diabetes mellitus ([place]) 02/06/2012    Past Surgical History:  Procedure Laterality Date  . HAND SURGERY    . INCISION AND DRAINAGE ABSCESS N/A 10/02/2015   Procedure: INCISION AND DRAINAGE ABSCESS;  Surgeon: Festus Aloe, MD;  Location: [facility] ORS;  Service: Urology;  Laterality: N/A;  . INCISION AND DRAINAGE ABSCESS N/A 07/18/2019   Procedure: INCISION AND DRAINAGE SCROTAL ABSCESS;  Surgeon: Abbie Sons, MD;  Location: [facility] ORS;  Service: Urology;  Laterality: N/A;  . KNEE ARTHROSCOPY WITH MEDIAL MENISECTOMY Right  03/17/2019   Procedure: KNEE ARTHROSCOPY WITH MEDIAL MENISECTOMY, POSSIBLE CHONDROPLASTY RIGHT - DIABETIC;  Surgeon: Leim Fabry, MD;  Location: [facility] ORS;  Service: Orthopedics;  Laterality: Right;  . TONSILLECTOMY    . TONSILLECTOMY AND ADENOIDECTOMY    . UMBILICAL HERNIA REPAIR      Prior to Admission medications   Medication Sig Start Date End Date Taking? Authorizing Provider  blood glucose meter kit and supplies KIT Dispense based on patient and insurance preference. Use up to four times daily as directed. (FOR ICD-9 250.00, 250.01). 08/24/19   Irene Pap N, DO  glucose blood test strip Use as instructed 08/24/19   Kayleen Memos, DO  insulin NPH-regular Human (70-30) 100 UNIT/ML injection Inject 40 Units into the skin every morning. 08/24/19   Kayleen Memos, DO  insulin regular (NOVOLIN R) 100 units/mL injection Inject 0.01-0.1 mLs (1-10 Units total) into the skin 3 (three) times daily before meals. Per sliding scale 08/24/19   Kayleen Memos, DO  Insulin Syringes, Disposable, U-100 0.5 ML MISC 40 Units by Does not apply route 3 (three) times daily. 08/24/19   Kayleen Memos, DO  Lancets [facility] ULTRASOFT) lancets Please use as prescribed 08/24/19   Irene Pap N, DO  ondansetron (ZOFRAN ODT) 4 MG disintegrating tablet Take 1 tablet (4 mg total) by mouth every 8 (eight) hours as needed. 11/16/19   Rudene Re, MD    Allergies Patient has no known allergies.  Family History  Problem Relation Age of Onset  . Diabetes Mellitus  II Paternal Grandfather   . Hypertension Mother     Social History Social History   Tobacco Use  . Smoking status: Former Smoker    Packs/day: 0.50    Years: 4.00    Pack years: 2.00    Types: Cigarettes  . Smokeless tobacco: Never Used  Substance Use Topics  . Alcohol use: Yes    Comment: occasional  . Drug use: Yes    Types: Marijuana    Comment: PT DENIES THIS DURING PREOP INTERVIEW ON 6-1 BUT IN DR PATELS H&P IT STATES HE DOES  MARIJUANA    Review of Systems  Constitutional: Negative for fever. Eyes: Negative for visual changes. ENT: Negative for sore throat. Neck: No neck pain  Cardiovascular: Negative for chest pain. Respiratory: Negative for shortness of breath. Gastrointestinal: Negative for abdominal pain,  Diarrhea. + N/V Genitourinary: Negative for dysuria. Musculoskeletal: Negative for back pain. Skin: Negative for rash. Neurological: Negative for headaches, weakness or numbness. Psych: No SI or HI  ____________________________________________   PHYSICAL EXAM:  VITAL SIGNS: ED Triage Vitals  Enc Vitals Group     BP 11/15/19 2321 140/87     Pulse Rate 11/15/19 2321 (!) 133     Resp 11/15/19 2321 18     Temp 11/15/19 2321 98.3 F (36.8 C)     Temp Source 11/15/19 2321 Oral     SpO2 11/15/19 2321 100 %     Weight 11/15/19 2321 170 lb (77.1 kg)     Height 11/15/19 2321 '5\' 9"'  (1.753 m)     Head Circumference --      Peak Flow --      Pain Score 11/15/19 2325 8     Pain Loc --      Pain Edu? --      Excl. in [place]? --     Constitutional: Alert and oriented. Well appearing and in no apparent distress. HEENT:      Head: Normocephalic and atraumatic.         Eyes: Conjunctivae are normal. Sclera is non-icteric.       Mouth/Throat: Mucous membranes are moist.       Neck: Supple with no signs of meningismus. Cardiovascular: Regular rate and rhythm. No murmurs, gallops, or rubs. 2+ symmetrical distal pulses are present in all extremities. No JVD. Respiratory: Normal respiratory effort. Lungs are clear to auscultation bilaterally. No wheezes, crackles, or rhonchi.  Gastrointestinal: Soft, non tender, and non distended with positive bowel sounds. No rebound or guarding. Genitourinary: No CVA tenderness. Musculoskeletal: Nontender with normal range of motion in all extremities. No edema, cyanosis, or erythema of extremities. Neurologic: Normal speech and language. Face is symmetric. Moving all  extremities. No gross focal neurologic deficits are appreciated. Skin: Skin is warm, dry and intact. No rash noted. Psychiatric: Mood and affect are normal. Speech and behavior are normal.  ____________________________________________   LABS (all labs ordered are listed, but only abnormal results are displayed)  Labs Reviewed  GLUCOSE, CAPILLARY - Abnormal; Notable for the following components:      Result Value   Glucose-Capillary 122 (*)    All other components within normal limits  COMPREHENSIVE METABOLIC PANEL - Abnormal; Notable for the following components:   Potassium 3.4 (*)    Glucose, Bld 129 (*)    Alkaline Phosphatase 131 (*)    All other components within normal limits  URINALYSIS, COMPLETE (UACMP) WITH MICROSCOPIC - Abnormal; Notable for the following components:   Color, Urine AMBER (*)  APPearance HAZY (*)    Specific Gravity, Urine 1.033 (*)    Glucose, UA 50 (*)    Ketones, ur 20 (*)    Protein, ur 100 (*)    Leukocytes,Ua SMALL (*)    All other components within normal limits  URINE CULTURE  CBC  LIPASE, BLOOD  CBG MONITORING, ED   ____________________________________________  EKG  none  ____________________________________________  RADIOLOGY  none  ____________________________________________   PROCEDURES  Procedure(s) performed: None Procedures Critical Care performed:  None ____________________________________________   INITIAL IMPRESSION / ASSESSMENT AND PLAN / ED COURSE   24 y.o. male with a history of type 1 diabetes who presents for evaluation of nausea and vomiting.  Patient with several episodes of nausea and vomiting for the last 24 hours.  No associated symptoms.  Patient received Zofran in triage and upon arrival to the room reports that his nausea had resolved.  He denies any abdominal pain.  Initially was tachycardic in the waiting room with heart rate in the 130s.  That improved upon arrival to the room.  Recommended IV  fluids but patient preferred p.o. hydration.  He is afebrile otherwise with no symptoms of Covid.  Labs showing no evidence of DKA, acute kidney injury, or any electrolyte derangements.  Patient was challenged orally with no recurrence of his nausea vomiting.  Most likely gastroparesis from diabetes versus gastritis versus GERD.   Clinical Course as of Nov 15 346  Wed Nov 16, 2019  0347 UA with some bacteria. Patient denies any dysuria or penile discharge. Treated for chlamydia 6 weeks ago with full resolution of symptoms. Remains with no abdominal pain and tolerated PO. Will dc home on zofran with increased PO hydration and follow up with his PCP.   [CV]    Clinical Course User Index [CV] Alfred Levins [place], MD      As part of my medical decision making, I reviewed the following data within the [place] notes reviewed and incorporated, Labs reviewed , Old chart reviewed, Notes from prior ED visits and [place] Controlled Substance Database   Please note:  Patient was evaluated in Emergency Department today for the symptoms described in the history of present illness. Patient was evaluated in the context of the global COVID-19 pandemic, which necessitated consideration that the patient might be at risk for infection with the SARS-CoV-2 virus that causes COVID-19. Institutional protocols and algorithms that pertain to the evaluation of patients at risk for COVID-19 are in a state of rapid change based on information released by regulatory bodies including the CDC and federal and state organizations. These policies and algorithms were followed during the patient's care in the ED.  Some ED evaluations and interventions may be delayed as a result of limited staffing during the pandemic.   ____________________________________________   FINAL CLINICAL IMPRESSION(S) / ED DIAGNOSES   Final diagnoses:  Non-intractable vomiting with nausea, unspecified vomiting type       NEW MEDICATIONS STARTED DURING THIS VISIT:  ED Discharge Orders         Ordered    ondansetron (ZOFRAN ODT) 4 MG disintegrating tablet  Every 8 hours PRN     11/16/19 0325           Note:  This document was prepared using Dragon voice recognition software and may include unintentional dictation errors.    Alfred Levins, [place], MD 11/16/19 [phone]

## 2019-11-17 LAB — URINE CULTURE: Culture: <10000 — AB

## 2020-01-03 ENCOUNTER — Other Ambulatory Visit: Payer: Self-pay

## 2020-01-03 ENCOUNTER — Encounter: Payer: Self-pay | Admitting: Emergency Medicine

## 2020-01-03 ENCOUNTER — Inpatient Hospital Stay
Admission: EM | Admit: 2020-01-03 | Discharge: 2020-01-05 | DRG: 988 | Disposition: A | Discharge status: Home / Self Care | Payer: BC Managed Care – PPO | Attending: Internal Medicine | Admitting: Internal Medicine

## 2020-01-03 ENCOUNTER — Emergency Department: Payer: BC Managed Care – PPO

## 2020-01-03 DIAGNOSIS — Z8249 Family history of ischemic heart disease and other diseases of the circulatory system: Secondary | ICD-10-CM | POA: Diagnosis not present

## 2020-01-03 DIAGNOSIS — Z87891 Personal history of nicotine dependence: Secondary | ICD-10-CM | POA: Diagnosis not present

## 2020-01-03 DIAGNOSIS — L02215 Cutaneous abscess of perineum: Secondary | ICD-10-CM

## 2020-01-03 DIAGNOSIS — Z20822 Contact with and (suspected) exposure to covid-19: Secondary | ICD-10-CM | POA: Diagnosis present

## 2020-01-03 DIAGNOSIS — Z833 Family history of diabetes mellitus: Secondary | ICD-10-CM | POA: Diagnosis not present

## 2020-01-03 DIAGNOSIS — E10628 Type 1 diabetes mellitus with other skin complications: Principal | ICD-10-CM | POA: Diagnosis present

## 2020-01-03 DIAGNOSIS — Z79899 Other long term (current) drug therapy: Secondary | ICD-10-CM

## 2020-01-03 DIAGNOSIS — E1065 Type 1 diabetes mellitus with hyperglycemia: Secondary | ICD-10-CM | POA: Diagnosis present

## 2020-01-03 DIAGNOSIS — Z794 Long term (current) use of insulin: Secondary | ICD-10-CM

## 2020-01-03 DIAGNOSIS — E872 Acidosis, unspecified: Secondary | ICD-10-CM

## 2020-01-03 LAB — COMPREHENSIVE METABOLIC PANEL
ALT: 23 U/L (ref 0–44)
AST: 42 U/L — ABNORMAL HIGH (ref 15–41)
Albumin: 4.1 g/dL (ref 3.5–5.0)
Alkaline Phosphatase: 126 U/L (ref 38–126)
Anion gap: 14 (ref 5–15)
BUN: 16 mg/dL (ref 6–20)
CO2: 22 mmol/L (ref 22–32)
Calcium: 9.4 mg/dL (ref 8.9–10.3)
Chloride: 99 mmol/L (ref 98–111)
Creatinine, Ser: 1.12 mg/dL (ref 0.61–1.24)
GFR calc Af Amer: >60 mL/min (ref >60)
GFR calc non Af Amer: >60 mL/min (ref >60)
Glucose, Bld: 357 mg/dL — ABNORMAL HIGH (ref 70–99)
Potassium: 5.3 mmol/L — ABNORMAL HIGH (ref 3.5–5.1)
Sodium: 135 mmol/L (ref 135–145)
Total Bilirubin: 1.5 mg/dL — ABNORMAL HIGH (ref 0.3–1.2)
Total Protein: 7.7 g/dL (ref 6.5–8.1)

## 2020-01-03 LAB — CBC WITH DIFFERENTIAL/PLATELET
Abs Immature Granulocytes: 0.02 10*3/uL (ref 0.00–0.07)
Basophils Absolute: 0 10*3/uL (ref 0.0–0.1)
Basophils Relative: 1 %
Eosinophils Absolute: 0.2 10*3/uL (ref 0.0–0.5)
Eosinophils Relative: 2 %
HCT: 43.9 % (ref 39.0–52.0)
Hemoglobin: 14.8 g/dL (ref 13.0–17.0)
Immature Granulocytes: 0 %
Lymphocytes Relative: 19 %
Lymphs Abs: 1.7 10*3/uL (ref 0.7–4.0)
MCH: 29.9 pg (ref 26.0–34.0)
MCHC: 33.7 g/dL (ref 30.0–36.0)
MCV: 88.7 fL (ref 80.0–100.0)
Monocytes Absolute: 0.5 10*3/uL (ref 0.1–1.0)
Monocytes Relative: 6 %
Neutro Abs: 6.4 10*3/uL (ref 1.7–7.7)
Neutrophils Relative %: 72 %
Platelets: 248 10*3/uL (ref 150–400)
RBC: 4.95 MIL/uL (ref 4.22–5.81)
RDW: 12.4 % (ref 11.5–15.5)
WBC: 8.8 10*3/uL (ref 4.0–10.5)
nRBC: 0 % (ref 0.0–0.2)

## 2020-01-03 LAB — LACTIC ACID, PLASMA: Lactic Acid, Venous: 4.6 mmol/L (ref 0.5–1.9)

## 2020-01-03 MED ORDER — SODIUM CHLORIDE 0.9 % IV BOLUS
1000.0000 mL | Freq: Once | INTRAVENOUS | Status: AC
  Administered 2020-01-03: 1000 mL via INTRAVENOUS

## 2020-01-03 MED ORDER — MORPHINE SULFATE (PF) 4 MG/ML IV SOLN
4.0000 mg | Freq: Once | INTRAVENOUS | Status: AC
  Administered 2020-01-03: 4 mg via INTRAVENOUS
  Filled 2020-01-03: qty 1

## 2020-01-03 MED ORDER — PIPERACILLIN-TAZOBACTAM 3.375 G IVPB 30 MIN: 3.3750 g | Freq: Once | INTRAVENOUS | Status: DC

## 2020-01-03 MED ORDER — SODIUM CHLORIDE 0.9 % IV SOLN: INTRAVENOUS | Status: DC

## 2020-01-03 MED ORDER — PIPERACILLIN-TAZOBACTAM 3.375 G IVPB
3.3750 g | Freq: Three times a day (TID) | INTRAVENOUS | Status: DC
  Administered 2020-01-04 – 2020-01-05 (×3): 3.375 g via INTRAVENOUS
  Filled 2020-01-03 (×7): qty 50

## 2020-01-03 MED ORDER — MORPHINE SULFATE (PF) 4 MG/ML IV SOLN
4.0000 mg | INTRAVENOUS | Status: DC | PRN
  Administered 2020-01-04: 2 mg via INTRAVENOUS
  Administered 2020-01-04 – 2020-01-05 (×2): 4 mg via INTRAVENOUS
  Filled 2020-01-03 (×3): qty 1

## 2020-01-03 MED ORDER — VANCOMYCIN HCL IN DEXTROSE 1-5 GM/200ML-% IV SOLN: 1000.0000 mg | Freq: Once | INTRAVENOUS | Status: DC

## 2020-01-03 MED ORDER — VANCOMYCIN HCL 2000 MG/400ML IV SOLN
2000.0000 mg | Freq: Once | INTRAVENOUS | Status: AC
  Administered 2020-01-03: 2000 mg via INTRAVENOUS
  Filled 2020-01-03: qty 400

## 2020-01-03 MED ORDER — ONDANSETRON HCL 4 MG PO TABS: 4.0000 mg | ORAL_TABLET | Freq: Four times a day (QID) | ORAL | Status: DC | PRN

## 2020-01-03 MED ORDER — ONDANSETRON HCL 4 MG/2ML IJ SOLN: 4.0000 mg | Freq: Four times a day (QID) | INTRAMUSCULAR | Status: DC | PRN

## 2020-01-03 MED ORDER — ONDANSETRON HCL 4 MG/2ML IJ SOLN
4.0000 mg | Freq: Once | INTRAMUSCULAR | Status: AC
  Administered 2020-01-03: 4 mg via INTRAVENOUS
  Filled 2020-01-03: qty 2

## 2020-01-03 MED ORDER — IOHEXOL 300 MG/ML  SOLN
100.0000 mL | Freq: Once | INTRAMUSCULAR | Status: AC | PRN
  Administered 2020-01-03: 100 mL via INTRAVENOUS

## 2020-01-03 MED ORDER — PIPERACILLIN-TAZOBACTAM 3.375 G IVPB 30 MIN
3.3750 g | Freq: Once | INTRAVENOUS | Status: AC
  Administered 2020-01-03: 3.375 g via INTRAVENOUS
  Filled 2020-01-03: qty 50

## 2020-01-03 MED ORDER — ENOXAPARIN SODIUM 40 MG/0.4ML ~~LOC~~ SOLN
40.0000 mg | SUBCUTANEOUS | Status: DC
  Administered 2020-01-04 (×2): 40 mg via SUBCUTANEOUS
  Filled 2020-01-03 (×2): qty 0.4

## 2020-01-03 MED ORDER — INSULIN ASPART 100 UNIT/ML ~~LOC~~ SOLN
0.0000 [IU] | SUBCUTANEOUS | Status: DC
  Filled 2020-01-03: qty 1

## 2020-01-03 NOTE — Progress Notes (Signed)
PHARMACY -  BRIEF ANTIBIOTIC NOTE   Pharmacy has received consult(s) for vancomycin from an ED provider.  The patient's profile has been reviewed for ht/wt/allergies/indication/available labs.    One time order(s) placed for vancomycin  Further antibiotics/pharmacy consults should be ordered by admitting physician if indicated.                       Thank you,  Thomasene Ripple, PharmD, BCPS Clinical Pharmacist 01/03/2020  10:59 PM

## 2020-01-03 NOTE — ED Triage Notes (Signed)
Patient ambulatory to triage with steady gait, without difficulty or distress noted, mask in place; pt reports abscess since yesterday; st hx of same for last 79mos "and they coming moving further and further back"; area of hardness noted just to left of rectum

## 2020-01-03 NOTE — ED Provider Notes (Signed)
[facility] Emergency Department Provider Note  ____________________________________________   First MD Initiated Contact with Patient 01/03/20 2209     (approximate)  I have reviewed the triage vital signs and the nursing notes.  History  Chief Complaint Abscess    HPI Wesley [name] is a 24 y.o. male with hx of DM1, recurrent perineal/scrotal abscesses who presents for possible recurrent abscess in the left perineal region. Symptoms started yesterday. Complains of pain, swelling, redness to the region. 10/10 in severity. No radiation. No alleviating symptoms. Worsened with palpation and laying flat. Has required multiple I&D previously. No GI hx - no history of Crohn's or UC. Negative HIV testing in October. No fevers, nausea, vomiting.    Past Medical Hx Past Medical History:  Diagnosis Date  . Diabetes mellitus without complication ([place])    type 1 diabetes    Problem List Patient Active Problem List   Diagnosis Date Noted  . DKA, type 1 ([place]) 08/23/2019  . Cellulitis of scrotum 07/18/2019  . Acute renal failure ([place])   . Tobacco use disorder 02/10/2018  . DKA (diabetic ketoacidoses) ([place]) 09/01/2016  . Sepsis ([place]) 10/02/2015  . Scrotal abscess   . Diabetes mellitus with ketoacidosis ([place]) 08/13/2012  . Type 1 diabetes mellitus ([place]) 02/06/2012    Past Surgical Hx Past Surgical History:  Procedure Laterality Date  . HAND SURGERY    . INCISION AND DRAINAGE ABSCESS N/A 10/02/2015   Procedure: INCISION AND DRAINAGE ABSCESS;  Surgeon: Festus Aloe, MD;  Location: [facility] ORS;  Service: Urology;  Laterality: N/A;  . INCISION AND DRAINAGE ABSCESS N/A 07/18/2019   Procedure: INCISION AND DRAINAGE SCROTAL ABSCESS;  Surgeon: Abbie Sons, MD;  Location: [facility] ORS;  Service: Urology;  Laterality: N/A;  . KNEE ARTHROSCOPY WITH MEDIAL MENISECTOMY Right 03/17/2019   Procedure: KNEE ARTHROSCOPY WITH MEDIAL MENISECTOMY, POSSIBLE CHONDROPLASTY RIGHT -  DIABETIC;  Surgeon: Leim Fabry, MD;  Location: [facility] ORS;  Service: Orthopedics;  Laterality: Right;  . TONSILLECTOMY    . TONSILLECTOMY AND ADENOIDECTOMY    . UMBILICAL HERNIA REPAIR      Medications Prior to Admission medications   Medication Sig Start Date End Date Taking? Authorizing Provider  blood glucose meter kit and supplies KIT Dispense based on patient and insurance preference. Use up to four times daily as directed. (FOR ICD-9 250.00, 250.01). 08/24/19   Irene Pap N, DO  glucose blood test strip Use as instructed 08/24/19   Kayleen Memos, DO  insulin NPH-regular Human (70-30) 100 UNIT/ML injection Inject 40 Units into the skin every morning. 08/24/19   Kayleen Memos, DO  insulin regular (NOVOLIN R) 100 units/mL injection Inject 0.01-0.1 mLs (1-10 Units total) into the skin 3 (three) times daily before meals. Per sliding scale 08/24/19   Kayleen Memos, DO  Insulin Syringes, Disposable, U-100 0.5 ML MISC 40 Units by Does not apply route 3 (three) times daily. 08/24/19   Kayleen Memos, DO  Lancets [facility] ULTRASOFT) lancets Please use as prescribed 08/24/19   Irene Pap N, DO  ondansetron (ZOFRAN ODT) 4 MG disintegrating tablet Take 1 tablet (4 mg total) by mouth every 8 (eight) hours as needed. 11/16/19   Rudene Re, MD    Allergies Patient has no known allergies.  Family Hx Family History  Problem Relation Age of Onset  . Diabetes Mellitus II Paternal Grandfather   . Hypertension Mother     Social Hx Social History   Tobacco Use  . Smoking status:  Former Smoker    Packs/day: 0.50    Years: 4.00    Pack years: 2.00    Types: Cigarettes  . Smokeless tobacco: Never Used  Substance Use Topics  . Alcohol use: Yes    Comment: occasional  . Drug use: Yes    Types: Marijuana    Comment: PT DENIES THIS DURING PREOP INTERVIEW ON 6-1 BUT IN DR PATELS H&P IT STATES HE DOES MARIJUANA     Review of Systems  Constitutional: Negative for fever,  chills. Eyes: Negative for visual changes. ENT: Negative for sore throat. Cardiovascular: Negative for chest pain. Respiratory: Negative for shortness of breath. Gastrointestinal: Negative for nausea, vomiting.  Genitourinary: Negative for dysuria. Musculoskeletal: Negative for leg swelling. Skin: + perineal abscess Neurological: Negative for headaches.   Physical Exam  Vital Signs: ED Triage Vitals [01/03/20 1959]  Enc Vitals Group     BP 133/88     Pulse Rate (!) 114     Resp 18     Temp 98.4 F (36.9 C)     Temp Source Oral     SpO2 98 %     Weight 175 lb (79.4 kg)     Height '5\' 9"'$  (1.753 m)     Head Circumference      Peak Flow      Pain Score 9     Pain Loc      Pain Edu?      Excl. in [place]?     Constitutional: Alert and oriented. Well appearing.  Head: Normocephalic. Atraumatic. Eyes: Conjunctivae clear, sclera anicteric. Pupils equal and symmetric. Nose: No masses or lesions. No congestion or rhinorrhea. Mouth/Throat: Wearing mask.  Neck: No stridor. Trachea midline.  Cardiovascular: Tachycardic, regular rhythm. Extremities well perfused. Respiratory: Normal respiratory effort.  Gastrointestinal: Non-distended.  Genitourinary: Chaperone present.~3 cm area of induration and tenderness to the L perineum area. No significant appreciable fluctuance. Musculoskeletal: No lower extremity edema. No deformities. Neurologic:  Normal speech and language. No gross focal or lateralizing neurologic deficits are appreciated.  Skin: See above for findings in GU. Psychiatric: Mood and affect are appropriate for situation.   EKG  N/A   Radiology  CT A/P  IMPRESSION:  1. Inflammatory changes of the subcutaneous soft tissues of the left  perineum with a phlegmon or developing abscess. Clinical correlation  is recommended. No soft tissue air.  2. No bowel obstruction. Normal appendix.     Procedures  Procedure(s) performed (including critical  care):  Procedures   Initial Impression / Assessment and Plan / MDM / ED Course  24 y.o. male who presents to the ED for recurrent perineal abscess.  Exam as above, suspect likely recurrent abscess. No evidence of Fournier's by exam.   Labs and imaging initiated in triage.  Imaging consistent with phlegmon/developing abscess of the left perineum. No white count and afebrile, however, lactic acid 4.6. Receiving IV antibiotics, fluids. Will plan to admit for IV abx, pain control, trending lactic acid, and re-evaluation tomorrow by general surgery to assess if collection is amenable to drainage. Dr. Christian Mate of surgery aware. Discussed with hospitalist for admission.     _______________________________  As part of my medical decision making I have reviewed available labs, radiology tests, reviewed old records.  Final Clinical Impression(s) / ED Diagnosis  Final diagnoses:  Perineal abscess       Note:  This document was prepared using Dragon voice recognition software and may include unintentional dictation errors.   Lilia Pro., MD  01/03/20 2357

## 2020-01-03 NOTE — H&P (Signed)
History and Physical    Wesley [name] FOY:[id number] DOB: Nov 22, 1995 DOA: 01/03/2020  PCP: [facility] [place]   Patient coming from: home  I have personally briefly reviewed patient's old medical records in [place]  Chief Complaint: pain and swelling near scrotum  HPI: Wesley [name] is a 24 y.o. male with medical history significant for type 1 diabetes since age 31, recurrent perineal/scrotal abscesses requiring I&D who presents today for evaluation of recurrent pain and swelling in the left perineal region.  Pain is severe and nonradiating.  Not resolving with over-the-counter's.  He denies fever or chills, has no nausea or vomiting ED Course: In the ER he was tachycardic at 114 but afebrile with otherwise normal vitals.  WBC normal at 8.8.  Lactic acid 4.6.  Glucose 357 with normal anion gap of 14.  T CT abdomen and pelvis that shows developing left perineum with phlegmon or developing abscess, no soft tissue air.  ER provider spoke with Dr. Christian Mate who will see patient for possible I&D either tonight or in the a.m.  Review of Systems: As per HPI otherwise 10 point review of systems negative.    Past Medical History:  Diagnosis Date  . Diabetes mellitus without complication ([place])    type 1 diabetes    Past Surgical History:  Procedure Laterality Date  . HAND SURGERY    . INCISION AND DRAINAGE ABSCESS N/A 10/02/2015   Procedure: INCISION AND DRAINAGE ABSCESS;  Surgeon: Festus Aloe, MD;  Location: [facility] ORS;  Service: Urology;  Laterality: N/A;  . INCISION AND DRAINAGE ABSCESS N/A 07/18/2019   Procedure: INCISION AND DRAINAGE SCROTAL ABSCESS;  Surgeon: Abbie Sons, MD;  Location: [facility] ORS;  Service: Urology;  Laterality: N/A;  . KNEE ARTHROSCOPY WITH MEDIAL MENISECTOMY Right 03/17/2019   Procedure: KNEE ARTHROSCOPY WITH MEDIAL MENISECTOMY, POSSIBLE CHONDROPLASTY RIGHT - DIABETIC;  Surgeon: Leim Fabry, MD;  Location: [facility] ORS;  Service:  Orthopedics;  Laterality: Right;  . TONSILLECTOMY    . TONSILLECTOMY AND ADENOIDECTOMY    . UMBILICAL HERNIA REPAIR       reports that he has quit smoking. His smoking use included cigarettes. He has a 2.00 pack-year smoking history. He has never used smokeless tobacco. He reports current alcohol use. He reports current drug use. Drug: Marijuana.  No Known Allergies  Family History  Problem Relation Age of Onset  . Diabetes Mellitus II Paternal Grandfather   . Hypertension Mother      Prior to Admission medications   Medication Sig Start Date End Date Taking? Authorizing Provider  blood glucose meter kit and supplies KIT Dispense based on patient and insurance preference. Use up to four times daily as directed. (FOR ICD-9 250.00, 250.01). 08/24/19   Irene Pap N, DO  glucose blood test strip Use as instructed 08/24/19   Kayleen Memos, DO  insulin NPH-regular Human (70-30) 100 UNIT/ML injection Inject 40 Units into the skin every morning. 08/24/19   Kayleen Memos, DO  insulin regular (NOVOLIN R) 100 units/mL injection Inject 0.01-0.1 mLs (1-10 Units total) into the skin 3 (three) times daily before meals. Per sliding scale 08/24/19   Kayleen Memos, DO  Insulin Syringes, Disposable, U-100 0.5 ML MISC 40 Units by Does not apply route 3 (three) times daily. 08/24/19   Kayleen Memos, DO  Lancets [facility] ULTRASOFT) lancets Please use as prescribed 08/24/19   Irene Pap N, DO  ondansetron (ZOFRAN ODT) 4 MG disintegrating tablet Take 1 tablet (4 mg  total) by mouth every 8 (eight) hours as needed. 11/16/19   Rudene Re, MD    Physical Exam: Vitals:   01/03/20 1959  BP: 133/88  Pulse: (!) 114  Resp: 18  Temp: 98.4 F (36.9 C)  TempSrc: Oral  SpO2: 98%  Weight: 79.4 kg  Height: 5' 9" (1.753 m)     Vitals:   01/03/20 1959  BP: 133/88  Pulse: (!) 114  Resp: 18  Temp: 98.4 F (36.9 C)  TempSrc: Oral  SpO2: 98%  Weight: 79.4 kg  Height: 5' 9" (1.753 m)     Constitutional: Alert and awake, oriented x3, not in any acute distress. Eyes: PERLA, EOMI, irises appear normal, anicteric sclera,  ENMT: external ears and nose appear normal, normal hearing             Lips appears normal, oropharynx mucosa, tongue, posterior pharynx appear normal  Neck: neck appears normal, no masses, normal ROM, no thyromegaly, no JVD  CVS: S1-S2 clear, no murmur rubs or gallops,  , no carotid bruits, pedal pulses palpable, No LE edema Respiratory:  clear to auscultation bilaterally, no wheezing, rales or rhonchi. Respiratory effort normal. No accessory muscle use.  Abdomen: soft nontender, nondistended, normal bowel sounds, no hepatosplenomegaly, no hernias GU: Tender swelling left perineum just posterior to left scrotum.  About 3 x 2 cm Musculoskeletal: : no cyanosis, clubbing , no contractures or atrophy Neuro: Cranial nerves II-XII intact, sensation, reflexes normal, strength Psych: judgement and insight appear normal, stable mood and affect,  Skin: no rashes or lesions or ulcers, no induration or nodules   Labs on Admission: I have personally reviewed following labs and imaging studies  CBC: Recent Labs  Lab 01/03/20 2016  WBC 8.8  NEUTROABS 6.4  HGB 14.8  HCT 43.9  MCV 88.7  PLT 294   Basic Metabolic Panel: Recent Labs  Lab 01/03/20 2016  NA 135  K 5.3*  CL 99  CO2 22  GLUCOSE 357*  BUN 16  CREATININE 1.12  CALCIUM 9.4   GFR: Estimated Creatinine Clearance: 102.6 mL/min (by C-G formula based on SCr of 1.12 mg/dL). Liver Function Tests: Recent Labs  Lab 01/03/20 2016  AST 42*  ALT 23  ALKPHOS 126  BILITOT 1.5*  PROT 7.7  ALBUMIN 4.1   No results for input(s): LIPASE, AMYLASE in the last 168 hours. No results for input(s): AMMONIA in the last 168 hours. Coagulation Profile: No results for input(s): INR, PROTIME in the last 168 hours. Cardiac Enzymes: No results for input(s): CKTOTAL, CKMB, CKMBINDEX, TROPONINI in the last 168  hours. BNP (last 3 results) No results for input(s): PROBNP in the last 8760 hours. HbA1C: No results for input(s): HGBA1C in the last 72 hours. CBG: No results for input(s): GLUCAP in the last 168 hours. Lipid Profile: No results for input(s): CHOL, HDL, LDLCALC, TRIG, CHOLHDL, LDLDIRECT in the last 72 hours. Thyroid Function Tests: No results for input(s): TSH, T4TOTAL, FREET4, T3FREE, THYROIDAB in the last 72 hours. Anemia Panel: No results for input(s): VITAMINB12, FOLATE, FERRITIN, TIBC, IRON, RETICCTPCT in the last 72 hours. Urine analysis:    Component Value Date/Time   COLORURINE AMBER (A) 11/15/2019 2328   APPEARANCEUR HAZY (A) 11/15/2019 2328   APPEARANCEUR Clear 12/24/2011 1505   LABSPEC 1.033 (H) 11/15/2019 2328   LABSPEC 1.030 12/24/2011 1505   PHURINE 6.0 11/15/2019 2328   GLUCOSEU 50 (A) 11/15/2019 2328   GLUCOSEU >=500 12/24/2011 1505   HGBUR NEGATIVE 11/15/2019 2328   BILIRUBINUR  NEGATIVE 11/15/2019 2328   BILIRUBINUR Negative 12/24/2011 1505   KETONESUR 20 (A) 11/15/2019 2328   PROTEINUR 100 (A) 11/15/2019 2328   NITRITE NEGATIVE 11/15/2019 2328   LEUKOCYTESUR SMALL (A) 11/15/2019 2328   LEUKOCYTESUR Negative 12/24/2011 1505    Radiological Exams on Admission: CT ABDOMEN PELVIS W CONTRAST  Result Date: 01/03/2020 CLINICAL DATA:  24 year old male with possible perineal abscess. EXAM: CT ABDOMEN AND PELVIS WITH CONTRAST TECHNIQUE: Multidetector CT imaging of the abdomen and pelvis was performed using the standard protocol following bolus administration of intravenous contrast. CONTRAST:  169m OMNIPAQUE IOHEXOL 300 MG/ML  SOLN COMPARISON:  CT abdomen pelvis dated 03/19/2017. FINDINGS: Lower chest: The visualized lung bases are clear. No intra-abdominal free air or free fluid. Hepatobiliary: No focal liver abnormality is seen. No gallstones, gallbladder wall thickening, or biliary dilatation. Pancreas: Unremarkable. No pancreatic ductal dilatation or surrounding  inflammatory changes. Spleen: Normal in size without focal abnormality. Adrenals/Urinary Tract: Adrenal glands are unremarkable. Kidneys are normal, without renal calculi, focal lesion, or hydronephrosis. Bladder is unremarkable. Stomach/Bowel: There is moderate stool throughout the colon. No bowel obstruction or active inflammation. The appendix is normal. Vascular/Lymphatic: The abdominal aorta IVC are unremarkable. No portal venous gas. There is no adenopathy. Reproductive: The prostate and seminal vesicles are grossly unremarkable. Other: There is induration the subcutaneous soft tissues of the left perineum with a 2.8 x 2.5 cm more focal inflammatory area which may represent phlegmon or developing abscess. Clinical correlation is recommended. No soft tissue air. Musculoskeletal: No acute or significant osseous findings. IMPRESSION: 1. Inflammatory changes of the subcutaneous soft tissues of the left perineum with a phlegmon or developing abscess. Clinical correlation is recommended. No soft tissue air. 2. No bowel obstruction. Normal appendix. Electronically Signed   By: AAnner CreteM.D.   On: 01/03/2020 21:25    EKG: Independently reviewed.   Assessment/Plan Principal Problem:   Perineal abscess -IV Zosyn and vancomycin -Dr. RChristian Mateconsulted who will assess patient for need for I&D -IV hydration    Hyperglycemia due to type 1 diabetes mellitus ([place] -Patient will be n.p.o. for possible procedures so sticks every 4 with sliding scale coverage -A1c test as baseline control    Lactic acidosis -Patient was tachycardic but otherwise afebrile with normal white cell count. -Given high lactic acid of 4.6, will monitor closely for sepsis criteria.    DVT prophylaxis: Lovenox  Code Status: full cod  Family Communication:  none  Disposition Plan: Back to previous home environment Consults called: rodenberg  Status:inp    HAthena MasseMD [facility]     01/03/2020, 11:32  PM

## 2020-01-03 NOTE — ED Notes (Signed)
Notified Katie F RN pt placed in room

## 2020-01-03 NOTE — ED Notes (Signed)
J Cuthreill, PA to triage for initial exam, orders obtained

## 2020-01-03 NOTE — ED Provider Notes (Signed)
[facility] Emergency Department Provider Note  ____________________________________________   None    (approximate)   I have reviewed the triage vital signs and the nursing notes.   Patient has been triaged with a MSE exam performed by myself at a minimum. Based on symptoms and screening exam, patient may receive a more in-depth exam, labs, imaging as detailed below. Patients have been advised of this setting and exam type at the time of patient interview.    HISTORY  Chief Complaint Abscess  Given patient's presenting symptoms, I was asked to evaluate the patient in triage to place orders to facilitate patient's care.  HPI Wesley [name] is a 24 y.o. male presents to the emergency department with a complaint of possible abscess in the perineal region.  Patient presented to the emergency department complaining of pain, swelling, redness to the perineal region.  Patient states that this is the fourth abscess in approximately 6 years to develop in this region.  Patient states that they have been drained 3 other times.  Last occurrence was approximately a year ago.  Patient states that the erythema, edema, pain is worse than the previous times.  No dysuria, polyuria, hematuria.  No rectal bleeding.  Patient with no GI history.  Patient is a diabetic.  Patient denies any fevers or chills, URI symptoms, abdominal pain, nausea vomiting, diarrhea or constipation..   Patient will receive a medical screening exam as detailed below.  Based off of this exam, more in depth exam, labs, imaging will be performed as needed for complaint.  Patient care will be eventually transferred to another provider in the emergency department for final exam, diagnosis and disposition.    Past Medical History:  Diagnosis Date  . Diabetes mellitus without complication ([place])    type 1 diabetes    Patient Active Problem List   Diagnosis Date Noted  . DKA, type 1 ([place]) 08/23/2019  .  Cellulitis of scrotum 07/18/2019  . Acute renal failure ([place])   . Tobacco use disorder 02/10/2018  . DKA (diabetic ketoacidoses) ([place]) 09/01/2016  . Sepsis ([place]) 10/02/2015  . Scrotal abscess   . Diabetes mellitus with ketoacidosis ([place]) 08/13/2012  . Type 1 diabetes mellitus ([place]) 02/06/2012    Past Surgical History:  Procedure Laterality Date  . HAND SURGERY    . INCISION AND DRAINAGE ABSCESS N/A 10/02/2015   Procedure: INCISION AND DRAINAGE ABSCESS;  Surgeon: Festus Aloe, MD;  Location: [facility] ORS;  Service: Urology;  Laterality: N/A;  . INCISION AND DRAINAGE ABSCESS N/A 07/18/2019   Procedure: INCISION AND DRAINAGE SCROTAL ABSCESS;  Surgeon: Abbie Sons, MD;  Location: [facility] ORS;  Service: Urology;  Laterality: N/A;  . KNEE ARTHROSCOPY WITH MEDIAL MENISECTOMY Right 03/17/2019   Procedure: KNEE ARTHROSCOPY WITH MEDIAL MENISECTOMY, POSSIBLE CHONDROPLASTY RIGHT - DIABETIC;  Surgeon: Leim Fabry, MD;  Location: [facility] ORS;  Service: Orthopedics;  Laterality: Right;  . TONSILLECTOMY    . TONSILLECTOMY AND ADENOIDECTOMY    . UMBILICAL HERNIA REPAIR      Prior to Admission medications   Medication Sig Start Date End Date Taking? Authorizing Provider  blood glucose meter kit and supplies KIT Dispense based on patient and insurance preference. Use up to four times daily as directed. (FOR ICD-9 250.00, 250.01). 08/24/19   Irene Pap N, DO  glucose blood test strip Use as instructed 08/24/19   Kayleen Memos, DO  insulin NPH-regular Human (70-30) 100 UNIT/ML injection Inject 40 Units into the skin every morning. 08/24/19  Irene Pap N, DO  insulin regular (NOVOLIN R) 100 units/mL injection Inject 0.01-0.1 mLs (1-10 Units total) into the skin 3 (three) times daily before meals. Per sliding scale 08/24/19   Kayleen Memos, DO  Insulin Syringes, Disposable, U-100 0.5 ML MISC 40 Units by Does not apply route 3 (three) times daily. 08/24/19   Kayleen Memos, DO  Lancets [facility]  ULTRASOFT) lancets Please use as prescribed 08/24/19   Irene Pap N, DO  ondansetron (ZOFRAN ODT) 4 MG disintegrating tablet Take 1 tablet (4 mg total) by mouth every 8 (eight) hours as needed. 11/16/19   Rudene Re, MD    Allergies Patient has no known allergies.  Family History  Problem Relation Age of Onset  . Diabetes Mellitus II Paternal Grandfather   . Hypertension Mother     Social History Social History   Tobacco Use  . Smoking status: Former Smoker    Packs/day: 0.50    Years: 4.00    Pack years: 2.00    Types: Cigarettes  . Smokeless tobacco: Never Used  Substance Use Topics  . Alcohol use: Yes    Comment: occasional  . Drug use: Yes    Types: Marijuana    Comment: PT DENIES THIS DURING PREOP INTERVIEW ON 6-1 BUT IN DR PATELS H&P IT STATES HE DOES MARIJUANA    Review of Systems Constitutional: Denies fever ENT: Denies nasal congestion/rhinorhea.  Denies sore throat Cardiovascular: Denies chest pain. Respiratory: Denies cough.  Denies shortness of breath/difficulty breathing Gastroenterology: Denies abdominal pain Musculoskeletal: Denies for musculoskeletal pain Integumentary: Positive for possible abscess to the perineal region Neurological: No focal weakness nor numbness.   ____________________________________________   PHYSICAL EXAM:  VITAL SIGNS: ED Triage Vitals [01/03/20 1959]  Enc Vitals Group     BP 133/88     Pulse Rate (!) 114     Resp 18     Temp 98.4 F (36.9 C)     Temp Source Oral     SpO2 98 %     Weight 175 lb (79.4 kg)     Height 5' 9" (1.753 m)     Head Circumference      Peak Flow      Pain Score 9     Pain Loc      Pain Edu?      Excl. in [place]?     Constitutional: Alert and oriented. Generally well appearing and in no acute distress. Eyes: Conjunctivae are normal.  Nose: No significant congestion/rhinnorhea. Mouth: No gross oropharyngeal edema. no erythema/edema Neck: No stridor.  No meningeal signs.     Cardiovascular: Grossly normal heart sounds. Respiratory: Normal respiratory effort without significant tachypnea and no observed retractions. Lungs clear to auscultation bilaterally Gastrointestinal: No significant visible abdominal wall findings.  Bowel sounds x4 quadrants.  No tenderness to palpation.  Visualization of the anus reveals no erythema, edema. Musculoskeletal: No gross deformities of extremities. Neurologic:  Normal speech and language. No gross focal neurologic deficits are appreciated.  Skin:  Skin is warm, dry and intact. No rash noted.  Visualization of the perineal region reveals gross erythema, edema to the perineal region.  Significant tenderness, fluctuance and induration is appreciated in this region.  This extends from the proximal inferior scrotal wall into the perineal region.  Does not encompass the anal/rectal region.    ____________________________________________   LABS (all labs ordered are listed, but only abnormal results are displayed)  Labs Reviewed  CULTURE, BLOOD (ROUTINE X 2)  CULTURE,  BLOOD (ROUTINE X 2)  CBC WITH DIFFERENTIAL/PLATELET  COMPREHENSIVE METABOLIC PANEL  LACTIC ACID, PLASMA  LACTIC ACID, PLASMA    ____________________________________________   RADIOLOGY I, Charline Bills Cuthriell, personally viewed and evaluated these images (plain radiographs) as part of my medical decision making, as well as reviewing the written report by the radiologist.  Official radiology report(s): No results found.  ____________________________________________    INITIAL IMPRESSION / MDM / ASSESSMENT AND PLAN / ED COURSE  As part of my medical decision making, I reviewed the following data within the electronic MEDICAL RECORD NUMBER Notes from prior ED visits and [place] Controlled Substance Database      Clinical Impression: Perineal abscess  Plan: Patient presented to the emergency department complaining of recurrent lesion in the perineal region.  Exam  was concerning for infection.  Given the location, the fact that it is recurrent, concern for Fournier's gangrene, fistula, perirectal involvement, or large abscess existed.  Given these findings and concerns, patient will be evaluated with labs, imaging.  The patient has been medically screened at this time, is awaiting work-up and will be reevaluated, likely by another provider.  Patient has been screened based based on their arrival complaint, evaluated for an emergent condition, and at a minimum has received a medical screening exam.  At this time, patient will receive the further work-up listed above that was determined by medical screening exam.  Patient care will be transferred to another provider in the emergency department once patient is roomed for final diagnosis and disposition.    ____________________________________________  Note:  This document was prepared using Systems analyst and may include unintentional dictation errors.    Brynda Peon 01/03/20 2034    Lilia Pro., MD 01/04/20 Shelah Lewandowsky

## 2020-01-04 ENCOUNTER — Other Ambulatory Visit: Payer: Self-pay

## 2020-01-04 ENCOUNTER — Encounter: Payer: Self-pay | Admitting: Internal Medicine

## 2020-01-04 DIAGNOSIS — L02215 Cutaneous abscess of perineum: Secondary | ICD-10-CM

## 2020-01-04 LAB — BASIC METABOLIC PANEL
Anion gap: 9 (ref 5–15)
BUN: 8 mg/dL (ref 6–20)
CO2: 23 mmol/L (ref 22–32)
Calcium: 8.3 mg/dL — ABNORMAL LOW (ref 8.9–10.3)
Chloride: 108 mmol/L (ref 98–111)
Creatinine, Ser: 0.83 mg/dL (ref 0.61–1.24)
GFR calc Af Amer: >60 mL/min (ref >60)
GFR calc non Af Amer: >60 mL/min (ref >60)
Glucose, Bld: 72 mg/dL (ref 70–99)
Potassium: 3.7 mmol/L (ref 3.5–5.1)
Sodium: 140 mmol/L (ref 135–145)

## 2020-01-04 LAB — LACTIC ACID, PLASMA: Lactic Acid, Venous: 2.5 mmol/L (ref 0.5–1.9)

## 2020-01-04 LAB — CBC
HCT: 35.7 % — ABNORMAL LOW (ref 39.0–52.0)
Hemoglobin: 12 g/dL — ABNORMAL LOW (ref 13.0–17.0)
MCH: 29.9 pg (ref 26.0–34.0)
MCHC: 33.6 g/dL (ref 30.0–36.0)
MCV: 88.8 fL (ref 80.0–100.0)
Platelets: 193 10*3/uL (ref 150–400)
RBC: 4.02 MIL/uL — ABNORMAL LOW (ref 4.22–5.81)
RDW: 12.5 % (ref 11.5–15.5)
WBC: 9 10*3/uL (ref 4.0–10.5)
nRBC: 0 % (ref 0.0–0.2)

## 2020-01-04 LAB — GLUCOSE, CAPILLARY
Glucose-Capillary: 66 mg/dL — ABNORMAL LOW (ref 70–99)
Glucose-Capillary: 105 mg/dL — ABNORMAL HIGH (ref 70–99)
Glucose-Capillary: 69 mg/dL — ABNORMAL LOW (ref 70–99)
Glucose-Capillary: 177 mg/dL — ABNORMAL HIGH (ref 70–99)
Glucose-Capillary: 205 mg/dL — ABNORMAL HIGH (ref 70–99)
Glucose-Capillary: 225 mg/dL — ABNORMAL HIGH (ref 70–99)

## 2020-01-04 LAB — SARS CORONAVIRUS 2 (TAT 6-24 HRS): SARS Coronavirus 2: NEGATIVE

## 2020-01-04 LAB — HEMOGLOBIN A1C
Hgb A1c MFr Bld: 11.1 % — ABNORMAL HIGH (ref 4.8–5.6)
Mean Plasma Glucose: 271.87 mg/dL

## 2020-01-04 LAB — HIV ANTIBODY (ROUTINE TESTING W REFLEX): HIV Screen 4th Generation wRfx: NONREACTIVE

## 2020-01-04 MED ORDER — INSULIN ASPART 100 UNIT/ML ~~LOC~~ SOLN
3.0000 [IU] | Freq: Three times a day (TID) | SUBCUTANEOUS | Status: DC
  Administered 2020-01-04 – 2020-01-05 (×3): 3 [IU] via SUBCUTANEOUS
  Filled 2020-01-04 (×3): qty 1

## 2020-01-04 MED ORDER — INSULIN ASPART 100 UNIT/ML ~~LOC~~ SOLN
0.0000 [IU] | Freq: Three times a day (TID) | SUBCUTANEOUS | Status: DC
  Administered 2020-01-04 – 2020-01-05 (×2): 3 [IU] via SUBCUTANEOUS
  Administered 2020-01-05: 10:00:00 5 [IU] via SUBCUTANEOUS
  Filled 2020-01-04 (×2): qty 1

## 2020-01-04 MED ORDER — INSULIN GLARGINE 100 UNIT/ML ~~LOC~~ SOLN
12.0000 [IU] | Freq: Every day | SUBCUTANEOUS | Status: DC
  Administered 2020-01-04 – 2020-01-05 (×2): 12 [IU] via SUBCUTANEOUS
  Filled 2020-01-04 (×2): qty 0.12

## 2020-01-04 MED ORDER — INSULIN ASPART 100 UNIT/ML ~~LOC~~ SOLN
0.0000 [IU] | Freq: Every day | SUBCUTANEOUS | Status: DC
  Administered 2020-01-04: 2 [IU] via SUBCUTANEOUS
  Filled 2020-01-04: qty 1

## 2020-01-04 MED ORDER — VANCOMYCIN HCL 1250 MG/250ML IV SOLN
1250.0000 mg | Freq: Two times a day (BID) | INTRAVENOUS | Status: DC
  Administered 2020-01-04 (×2): 1250 mg via INTRAVENOUS
  Filled 2020-01-04 (×4): qty 250

## 2020-01-04 NOTE — ED Notes (Signed)
Date and time results received: 01/04/20   Test: lactic acid Critical Value: 2.5  Name of Provider Notified: Anna Genre, NP

## 2020-01-04 NOTE — ED Notes (Addendum)
Report off to ashley rn  

## 2020-01-04 NOTE — Progress Notes (Signed)
PROGRESS NOTE    Wesley [name]  OTL:[id number] DOB: 12-Jun-1996 DOA: 01/03/2020 PCP: [facility]  Brief Narrative:  HPI: Wesley [name] is a 24 y.o. male with medical history significant for type 1 diabetes since age 15, recurrent perineal/scrotal abscesses requiring I&D who presents today for evaluation of recurrent pain and swelling in the left perineal region.  Pain is severe and nonradiating.  Not resolving with over-the-counter's.  He denies fever or chills, has no nausea or vomiting  3/24: Patient seen and examined.  Status post bedside I&D with general surgery.  Tolerated procedure well.  Pain well controlled.  Remains on broad-spectrum antibiotics.  All cultures negative to date.    Assessment & Plan:   Principal Problem:   Perineal abscess Active Problems:   Hyperglycemia due to type 1 diabetes mellitus (HCC)   Lactic acidosis   Perineal abscess Patient has had recurrent issues with perineal abscess formation Likely secondary to type 1 diabetes Status post bedside I&D with general surgery 01/04/2020 Plan: Pain control Routine postoperative care Continue Vanco Zosyn for now, de-escalate quickly starting tomorrow Follow cultures     Hyperglycemia due to type 1 diabetes mellitus (HCC) Advance to carb controlled diet Basal bolus insulin regimen Diabetes coordinator following, recommendations appreciated   DVT prophylaxis: Lovenox Code Status: Full Family Communication: Family at bedside Disposition Plan: Anticipate discharge home back to previous home environment.  Anticipate discharge on 01/05/2020.  Will ensure adequate pain control.  No anticipated barriers to discharge.  Patient is postop day 0 at this time.  Continuing broad-spectrum antibiotics.  Anticipate titration to oral regimen antibiotics on 3/25 in preparation for discharge   Consultants:   General surgery  Procedures:   Bedside I&D  01/04/2020  Antimicrobials:  Vancomycin  Zosyn   Subjective: Patient seen and examined Status post bedside I&D Pain well controlled  Objective: Vitals:   01/03/20 1959 01/03/20 2330 01/04/20 0000  BP: 133/88 120/87 119/81  Pulse: (!) 114 100 (!) 104  Resp: 18    Temp: 98.4 F (36.9 C)    TempSrc: Oral    SpO2: 98% 99% 100%  Weight: 79.4 kg    Height: 5\' 9"  (1.753 m)      Intake/Output Summary (Last 24 hours) at 01/04/2020 1519 Last data filed at 01/03/2020 2359 Gross per 24 hour  Intake 1000 ml  Output --  Net 1000 ml   Filed Weights   01/03/20 1959  Weight: 79.4 kg    Examination:  General exam: Appears calm and comfortable  Respiratory system: Clear to auscultation. Respiratory effort normal. Cardiovascular system: S1 & S2 heard, RRR. No JVD, murmurs, rubs, gallops or clicks. No pedal edema. Gastrointestinal system: Abdomen is nondistended, soft and nontender. No organomegaly or masses felt. Normal bowel sounds heard. GU: Tender swelling left perineum posterior to the scrotum Central nervous system: Alert and oriented. No focal neurological deficits. Extremities: Symmetric 5 x 5 power. Skin: No rashes, lesions or ulcers Psychiatry: Judgement and insight appear normal. Mood & affect appropriate.     Data Reviewed: I have personally reviewed following labs and imaging studies  CBC: Recent Labs  Lab 01/03/20 2016 01/04/20 0844  WBC 8.8 9.0  NEUTROABS 6.4  --   HGB 14.8 12.0*  HCT 43.9 35.7*  MCV 88.7 88.8  PLT 248 193   Basic Metabolic Panel: Recent Labs  Lab 01/03/20 2016 01/04/20 0844  NA 135 140  K 5.3* 3.7  CL 99 108  CO2 22 23  GLUCOSE 357*  72  BUN 16 8  CREATININE 1.12 0.83  CALCIUM 9.4 8.3*   GFR: Estimated Creatinine Clearance: 138.4 mL/min (by C-G formula based on SCr of 0.83 mg/dL). Liver Function Tests: Recent Labs  Lab 01/03/20 2016  AST 42*  ALT 23  ALKPHOS 126  BILITOT 1.5*  PROT 7.7  ALBUMIN 4.1   No results for  input(s): LIPASE, AMYLASE in the last 168 hours. No results for input(s): AMMONIA in the last 168 hours. Coagulation Profile: No results for input(s): INR, PROTIME in the last 168 hours. Cardiac Enzymes: No results for input(s): CKTOTAL, CKMB, CKMBINDEX, TROPONINI in the last 168 hours. BNP (last 3 results) No results for input(s): PROBNP in the last 8760 hours. HbA1C: Recent Labs    01/03/20 2016  HGBA1C 11.1*   CBG: Recent Labs  Lab 01/04/20 0100 01/04/20 0443 01/04/20 0842 01/04/20 1333  GLUCAP 66* 105* 69* 177*   Lipid Profile: No results for input(s): CHOL, HDL, LDLCALC, TRIG, CHOLHDL, LDLDIRECT in the last 72 hours. Thyroid Function Tests: No results for input(s): TSH, T4TOTAL, FREET4, T3FREE, THYROIDAB in the last 72 hours. Anemia Panel: No results for input(s): VITAMINB12, FOLATE, FERRITIN, TIBC, IRON, RETICCTPCT in the last 72 hours. Sepsis Labs: Recent Labs  Lab 01/03/20 2016 01/03/20 2318  LATICACIDVEN 4.6* 2.5*    Recent Results (from the past 240 hour(s))  Blood culture (routine x 2)     Status: None (Preliminary result)   Collection Time: 01/03/20  8:17 PM   Specimen: BLOOD  Result Value Ref Range Status   Specimen Description BLOOD RIGHT ANTECUBITAL  Final   Special Requests   Final    BOTTLES DRAWN AEROBIC AND ANAEROBIC Blood Culture adequate volume   Culture   Final    NO GROWTH < 12 HOURS Performed at [facility], [address], [place], [place] [address]    Report Status PENDING  Incomplete  SARS CORONAVIRUS 2 (TAT 6-24 HRS) Nasopharyngeal Nasopharyngeal Swab     Status: None   Collection Time: 01/03/20 11:18 PM   Specimen: Nasopharyngeal Swab  Result Value Ref Range Status   SARS Coronavirus 2 NEGATIVE NEGATIVE Final    Comment: (NOTE) SARS-CoV-2 target nucleic acids are NOT DETECTED. The SARS-CoV-2 RNA is generally detectable in upper and lower respiratory specimens during the acute phase of infection. Negative results do not  preclude SARS-CoV-2 infection, do not rule out co-infections with other pathogens, and should not be used as the sole basis for treatment or other patient management decisions. Negative results must be combined with clinical observations, patient history, and epidemiological information. The expected result is Negative. Fact Sheet for Patients: HairSlick.no Fact Sheet for Healthcare Providers: quierodirigir.com This test is not yet approved or cleared by the [place] FDA and  has been authorized for detection and/or diagnosis of SARS-CoV-2 by FDA under an Emergency Use Authorization (EUA). This EUA will remain  in effect (meaning this test can be used) for the duration of the COVID-19 declaration under Section 56 4(b)(1) of the Act, 21 U.S.C. section 360bbb-3(b)(1), unless the authorization is terminated or revoked sooner. Performed at [facility], [address] [place], [place] [address]          Radiology Studies: CT ABDOMEN PELVIS W CONTRAST  Result Date: 01/03/2020 CLINICAL DATA:  24 year old male with possible perineal abscess. EXAM: CT ABDOMEN AND PELVIS WITH CONTRAST TECHNIQUE: Multidetector CT imaging of the abdomen and pelvis was performed using the standard protocol following bolus administration of intravenous contrast.  CONTRAST:  152mL OMNIPAQUE IOHEXOL 300 MG/ML  SOLN COMPARISON:  CT abdomen pelvis dated 03/19/2017. FINDINGS: Lower chest: The visualized lung bases are clear. No intra-abdominal free air or free fluid. Hepatobiliary: No focal liver abnormality is seen. No gallstones, gallbladder wall thickening, or biliary dilatation. Pancreas: Unremarkable. No pancreatic ductal dilatation or surrounding inflammatory changes. Spleen: Normal in size without focal abnormality. Adrenals/Urinary Tract: Adrenal glands are unremarkable. Kidneys are normal, without renal calculi, focal lesion, or hydronephrosis.  Bladder is unremarkable. Stomach/Bowel: There is moderate stool throughout the colon. No bowel obstruction or active inflammation. The appendix is normal. Vascular/Lymphatic: The abdominal aorta IVC are unremarkable. No portal venous gas. There is no adenopathy. Reproductive: The prostate and seminal vesicles are grossly unremarkable. Other: There is induration the subcutaneous soft tissues of the left perineum with a 2.8 x 2.5 cm more focal inflammatory area which may represent phlegmon or developing abscess. Clinical correlation is recommended. No soft tissue air. Musculoskeletal: No acute or significant osseous findings. IMPRESSION: 1. Inflammatory changes of the subcutaneous soft tissues of the left perineum with a phlegmon or developing abscess. Clinical correlation is recommended. No soft tissue air. 2. No bowel obstruction. Normal appendix. Electronically Signed   By: Anner Crete M.D.   On: 01/03/2020 21:25        Scheduled Meds:  enoxaparin (LOVENOX) injection  40 mg Subcutaneous Q24H   insulin aspart  0-15 Units Subcutaneous TID WC   insulin aspart  0-5 Units Subcutaneous QHS   insulin aspart  3 Units Subcutaneous TID WC   insulin glargine  12 Units Subcutaneous Daily   Continuous Infusions:  sodium chloride 100 mL/hr at 01/04/20 0103   piperacillin-tazobactam (ZOSYN)  IV Stopped (01/04/20 1210)   vancomycin       LOS: 1 day    Time spent: 35 minutes    Sidney Ace, MD [facility] Pager [phone]  If 7PM-7AM, please contact night-coverage 01/04/2020, 3:19 PM

## 2020-01-04 NOTE — Consult Note (Signed)
[facility] SURGICAL CONSULTATION NOTE (initial) - cpt: 27253   HISTORY OF PRESENT ILLNESS (HPI):  24 y.o. male presented to [facility] ED yesterday for evaluation of scrotal pain. Patient reports about a 24 hour history of increasing pain, swelling, and erythema in his left scrotum and perineal region. The pain at the worst was a 10/10. Nothing made the pain better. Positional changes and palpation made the pain worse. No fever, chills at home. He does have a history of similar in the past and has required multiple I&Ds. The last was around 1 year ago. Work up in the ED was concerning for tachycardia to 114 however he was without fever. His WBC was normal at 8.8 but he did have a lactic acidosis to 4.6. CT of the Abdomen/Pelvis concerning for left perineal abscess/phelgmon without subq air. He was admitted to the medicine service for IV Abx and management.   Surgery is consulted by emergency medicine physician Dr. Ronny Bacon, MD in this context for evaluation and management of left perineal abscess.   PAST MEDICAL HISTORY (PMH):  Past Medical History:  Diagnosis Date  . Diabetes mellitus without complication ([place])    type 1 diabetes     PAST SURGICAL HISTORY ([place]):  Past Surgical History:  Procedure Laterality Date  . HAND SURGERY    . INCISION AND DRAINAGE ABSCESS N/A 10/02/2015   Procedure: INCISION AND DRAINAGE ABSCESS;  Surgeon: Festus Aloe, MD;  Location: [facility] ORS;  Service: Urology;  Laterality: N/A;  . INCISION AND DRAINAGE ABSCESS N/A 07/18/2019   Procedure: INCISION AND DRAINAGE SCROTAL ABSCESS;  Surgeon: Abbie Sons, MD;  Location: [facility] ORS;  Service: Urology;  Laterality: N/A;  . KNEE ARTHROSCOPY WITH MEDIAL MENISECTOMY Right 03/17/2019   Procedure: KNEE ARTHROSCOPY WITH MEDIAL MENISECTOMY, POSSIBLE CHONDROPLASTY RIGHT - DIABETIC;  Surgeon: Leim Fabry, MD;  Location: [facility] ORS;  Service: Orthopedics;  Laterality: Right;  . TONSILLECTOMY    .  TONSILLECTOMY AND ADENOIDECTOMY    . UMBILICAL HERNIA REPAIR       MEDICATIONS:  Prior to Admission medications   Medication Sig Start Date End Date Taking? Authorizing Provider  insulin NPH-regular Human (70-30) 100 UNIT/ML injection Inject 40 Units into the skin every morning. Patient taking differently: Inject 20 Units into the skin 2 (two) times daily with a meal.  08/24/19  Yes Hall, Carole N, DO  insulin regular (NOVOLIN R) 100 units/mL injection Inject 0.01-0.1 mLs (1-10 Units total) into the skin 3 (three) times daily before meals. Per sliding scale 08/24/19  Yes Irene Pap N, DO  blood glucose meter kit and supplies KIT Dispense based on patient and insurance preference. Use up to four times daily as directed. (FOR ICD-9 250.00, 250.01). 08/24/19   Irene Pap N, DO  glucose blood test strip Use as instructed 08/24/19   Kayleen Memos, DO  Insulin Syringes, Disposable, U-100 0.5 ML MISC 40 Units by Does not apply route 3 (three) times daily. 08/24/19   Kayleen Memos, DO  Lancets [facility] ULTRASOFT) lancets Please use as prescribed 08/24/19   Kayleen Memos, DO     ALLERGIES:  No Known Allergies   SOCIAL HISTORY:  Social History   Socioeconomic History  . Marital status: Single    Spouse name: Not on file  . Number of children: Not on file  . Years of education: Not on file  . Highest education level: Not on file  Occupational History  . Not on file  Tobacco Use  .  Smoking status: Former Smoker    Packs/day: 0.50    Years: 4.00    Pack years: 2.00    Types: Cigarettes  . Smokeless tobacco: Never Used  Substance and Sexual Activity  . Alcohol use: Yes    Comment: occasional  . Drug use: Yes    Types: Marijuana    Comment: PT DENIES THIS DURING PREOP INTERVIEW ON 6-1 BUT IN DR PATELS H&P IT STATES HE DOES MARIJUANA  . Sexual activity: Not on file  Other Topics Concern  . Not on file  Social History Narrative   Lives at home with his mother   Independent at  baseline   Social Determinants of Health   Financial Resource Strain:   . Difficulty of Paying Living Expenses:   Food Insecurity:   . Worried About Charity fundraiser in the Last Year:   . Arboriculturist in the Last Year:   Transportation Needs:   . Film/video editor (Medical):   Marland Kitchen Lack of Transportation (Non-Medical):   Physical Activity:   . Days of Exercise per Week:   . Minutes of Exercise per Session:   Stress:   . Feeling of Stress :   Social Connections:   . Frequency of Communication with Friends and Family:   . Frequency of Social Gatherings with Friends and Family:   . Attends Religious Services:   . Active Member of Clubs or Organizations:   . Attends Archivist Meetings:   Marland Kitchen Marital Status:   Intimate Partner Violence:   . Fear of Current or Ex-Partner:   . Emotionally Abused:   Marland Kitchen Physically Abused:   . Sexually Abused:      FAMILY HISTORY:  Family History  Problem Relation Age of Onset  . Diabetes Mellitus II Paternal Grandfather   . Hypertension Mother       REVIEW OF SYSTEMS:  Review of Systems  Constitutional: Negative for chills and fever.  HENT: Negative for congestion and sore throat.   Respiratory: Negative for cough and shortness of breath.   Cardiovascular: Negative for chest pain and palpitations.  Genitourinary: Negative for dysuria and urgency.       + Pain, Erythema Left Perineum   All other systems reviewed and are negative.   VITAL SIGNS:  Temp:  [98.4 F (36.9 C)] 98.4 F (36.9 C) (03/23 1959) Pulse Rate:  [100-114] 104 (03/24 0000) Resp:  [18] 18 (03/23 1959) BP: (119-133)/(81-88) 119/81 (03/24 0000) SpO2:  [98 %-100 %] 100 % (03/24 0000) Weight:  [79.4 kg] 79.4 kg (03/23 1959)     Height: _0  (175.3 cm) Weight: 79.4 kg BMI (Calculated): 25.83   INTAKE/OUTPUT:  03/23 0701 - 03/24 0700 In: 1000 [IV Piggyback:1000] Out: -   PHYSICAL EXAM:  Physical Exam Vitals and nursing note reviewed. Exam conducted  with a chaperone present.  Constitutional:      General: He is not in acute distress.    Appearance: Normal appearance. He is normal weight. He is not ill-appearing.  HENT:     Head: Normocephalic and atraumatic.  Eyes:     General: No scleral icterus.    Conjunctiva/sclera: Conjunctivae normal.  Cardiovascular:     Rate and Rhythm: Normal rate and regular rhythm.     Pulses: Normal pulses.  Pulmonary:     Effort: Pulmonary effort is normal. No respiratory distress.  Genitourinary:   Musculoskeletal:     Right lower leg: No edema.     Left lower  leg: No edema.  Skin:    General: Skin is warm and dry.     Findings: Erythema present.  Neurological:     General: No focal deficit present.     Mental Status: He is alert and oriented to person, place, and time.  Psychiatric:        Mood and Affect: Mood normal.        Behavior: Behavior normal.      Labs:  CBC Latest Ref Rng & Units 01/03/2020 11/15/2019 08/26/2019  WBC 4.0 - 10.5 K/uL 8.8 8.9 5.8  Hemoglobin 13.0 - 17.0 g/dL 14.8 15.8 12.3(L)  Hematocrit 39.0 - 52.0 % 43.9 45.1 36.2(L)  Platelets 150 - 400 K/uL 248 337 177   CMP Latest Ref Rng & Units 01/03/2020 11/15/2019 08/26/2019  Glucose 70 - 99 mg/dL 357(H) 129(H) 263(H)  BUN 6 - 20 mg/dL _0 Creatinine 0.61 - 1.24 mg/dL 1.12 0.99 0.79  Sodium 135 - 145 mmol/L 135 140 138  Potassium 3.5 - 5.1 mmol/L 5.3(H) 3.4(L) 3.9  Chloride 98 - 111 mmol/L 99 102 108  CO2 22 - 32 mmol/L 22 25 21(L)  Calcium 8.9 - 10.3 mg/dL 9.4 10.2 8.5(L)  Total Protein 6.5 - 8.1 g/dL 7.7 7.7 -  Total Bilirubin 0.3 - 1.2 mg/dL 1.5(H) 1.0 -  Alkaline Phos 38 - 126 U/L 126 131(H) -  AST 15 - 41 U/L 42(H) 26 -  ALT 0 - 44 U/L 23 25 -     Imaging studies:   CT Abdomen/Pelvis (01/03/2020) personally reviewed showing superficial abscess of the left perineal region, and radiologist report reviewed below:  IMPRESSION: 1. Inflammatory changes of the subcutaneous soft tissues of the  left perineum with a phlegmon or developing abscess. Clinical correlation is recommended. No soft tissue air. 2. No bowel obstruction. Normal appendix.    PROCEDURES:  Procedure(s):  1.) Incision and drainage of simple left perineal abscess  Preforming Provider:  Dr Ronny Bacon, MD; Edison Simon, PA-C  Assistnat: Mat Carne, PA-S  Pre-procedure Diagnosis: Left Perineal Abscess   Post Procedure Diagnosis: Same  Details of Procedure: All risks, benefits, and alternatives to above procedure(s) were discussed with the patient, all of his questions were answered to his expressed satisfaction, patient expresses he wishes to proceed, and informed consent was obtained.  Patient was positioned in the left lateral decubitus position and prepped and draped in sterile fashion. Using an 11 blade scalpel a cruciate over an area of fluctuance in the left perineum. Purulence was expressible. Kelly forceps were using to break up loculations. The area was then irrigated with normal saline and packed with a portion of a 4x4 gauze and covered.   Patient tolerated this well without complication.  Complications: None apparent    Assessment/Plan: (ICD-10's: L55.215) 24 y.o. male with simple left perineal abscess.   - Preformed I&D at bedside this morning; details above  - Continue IV ABx (Vancomycin & Zosyn); narrow as appropriate  - Okay for diet  - pain control prn  - further management per primary service   All of the above findings and recommendations were discussed with the patient, and all of patient's questions were answered to his expressed satisfaction.  Thank you for the opportunity to participate in this patient's care.   -- Edison Simon, PA-C [facility] 01/04/2020, 7:41 AM [phone] M-F: 7am - 4pm

## 2020-01-04 NOTE — Progress Notes (Signed)
Inpatient Diabetes Program Recommendations  AACE/ADA: New Consensus Statement on Inpatient Glycemic Control (2015)  Target Ranges:  Prepandial:   less than 140 mg/dL      Peak postprandial:   less than 180 mg/dL (1-2 hours)      Critically ill patients:  140 - 180 mg/dL   Lab Results  Component Value Date   GLUCAP 69 (L) 01/04/2020   HGBA1C 11.1 (H) 01/03/2020    Review of Glycemic Control Results for AXLE, PARFAIT (MRN [id number]) as of 01/04/2020 10:24  Ref. Range 01/04/2020 01:00 01/04/2020 04:43 01/04/2020 08:42  Glucose-Capillary Latest Ref Range: 70 - 99 mg/dL 66 (L) 317 (H) 69 (L)   Diabetes history: Type 1 DM  Outpatient Diabetes medications: Novolin 70/30 20 units bid, Regular SSI Current orders for Inpatient glycemic control:  Novolog moderate q 4 hours  Inpatient Diabetes Program Recommendations:    Please consider reducing Novolog correction to sensitive q 4 hours.  Also note that patient has history of Type 1 DM.  He will need basal insulin as well.  Consider adding Lantus 12 units daily.    Thanks,  Beryl Meager, RN, BC-ADM Inpatient Diabetes Coordinator Pager [phone] (8a-5p)

## 2020-01-04 NOTE — Progress Notes (Addendum)
Pharmacy Antibiotic Note  Wesley [name] is a 24 y.o. male admitted on 01/03/2020 with perirectal abscess.  Pharmacy has been consulted for vancomycin/zosyn dosing.  Plan: patient received vanc 2g IV load in ED.  Vancomycin 1250 mg IV Q 12 hrs. Goal AUC 400-550. Expected AUC: 488.2 SCr used: 1.12 Cssmin: 13.0  Will continue zosyn 3.375g IV q8h and continue monitor renal fx and clinical course and adjust doses as needed.  Height: 5\' 9"  (175.3 cm) Weight: 175 lb (79.4 kg) IBW/kg (Calculated) : 70.7  Temp (24hrs), Avg:98.4 F (36.9 C), Min:98.4 F (36.9 C), Max:98.4 F (36.9 C)  Recent Labs  Lab 01/03/20 2016 01/03/20 2318  WBC 8.8  --   CREATININE 1.12  --   LATICACIDVEN 4.6* 2.5*    Estimated Creatinine Clearance: 102.6 mL/min (by C-G formula based on SCr of 1.12 mg/dL).    No Known Allergies  Thank you for allowing pharmacy to be a part of this patient's care.  01/05/20, PharmD, BCPS Clinical Pharmacist 01/04/2020 12:38 AM

## 2020-01-04 NOTE — ED Notes (Signed)
fsbs 66

## 2020-01-05 LAB — GLUCOSE, CAPILLARY
Glucose-Capillary: 205 mg/dL — ABNORMAL HIGH (ref 70–99)
Glucose-Capillary: 221 mg/dL — ABNORMAL HIGH (ref 70–99)
Glucose-Capillary: 272 mg/dL — ABNORMAL HIGH (ref 70–99)
Glucose-Capillary: 310 mg/dL — ABNORMAL HIGH (ref 70–99)

## 2020-01-05 MED ORDER — SULFAMETHOXAZOLE-TRIMETHOPRIM 800-160 MG PO TABS: 1.0000 | ORAL_TABLET | Freq: Two times a day (BID) | ORAL | 0 refills | Status: AC

## 2020-01-05 MED ORDER — BLOOD GLUCOSE MONITOR KIT: PACK | 0 refills | Status: DC

## 2020-01-05 MED ORDER — INSULIN NPH ISOPHANE & REGULAR (70-30) 100 UNIT/ML ~~LOC~~ SUSP: 23.0000 [IU] | Freq: Two times a day (BID) | SUBCUTANEOUS | 0 refills | Status: DC

## 2020-01-05 MED ORDER — SODIUM CHLORIDE 0.9% FLUSH: 10.0000 mL | INTRAVENOUS | Status: DC | PRN

## 2020-01-05 MED ORDER — SULFAMETHOXAZOLE-TRIMETHOPRIM 800-160 MG PO TABS
1.0000 | ORAL_TABLET | Freq: Two times a day (BID) | ORAL | Status: DC
  Administered 2020-01-05: 1 via ORAL
  Filled 2020-01-05 (×2): qty 1

## 2020-01-05 MED ORDER — OXYCODONE HCL 5 MG PO TABS: 5.0000 mg | ORAL_TABLET | Freq: Three times a day (TID) | ORAL | 0 refills | Status: AC | PRN

## 2020-01-05 NOTE — Plan of Care (Signed)

## 2020-01-05 NOTE — Progress Notes (Signed)
Pt.'s Primary nurse  Aundra Millet ,RN notified not to infuse vanc IV tru midline  more than 6 days. RN verbalized understanding and appreciated information given. To notify [facility] if new PIV needed for longer Vanc infusion.

## 2020-01-05 NOTE — Progress Notes (Signed)
[facility] SURGICAL PROGRESS NOTE  Hospital Day(s): 2.   Post op day(s): 1 s/p bedside I&D.   Interval History:  Patient seen and examined no acute events or new complaints overnight.  Patient reports he is feeling much better, pain resolved, minimal drainage No fever, chills No new labs, glucose remains high BCx negative to date No new complaints   Vital signs in last 24 hours: [min-max] current  Temp:  [98.5 F (36.9 C)-98.6 F (37 C)] 98.5 F (36.9 C) (03/24 2344) Pulse Rate:  [72-82] 72 (03/24 2344) Resp:  [15-18] 15 (03/24 2344) BP: (103-130)/(66-90) 118/69 (03/24 2344) SpO2:  [97 %-100 %] 100 % (03/24 2344)     Height: 5\' 9"  (175.3 cm) Weight: 79.4 kg BMI (Calculated): 25.83   Intake/Output last 2 shifts:  03/24 0701 - 03/25 0700 In: 2584.3 [I.V.:2029.9; IV Piggyback:554.4] Out: -    Physical Exam:  Constitutional: alert, cooperative and no distress  Respiratory: breathing non-labored at rest  Integumentary: I&D site to the left perineum, no drainage, surrounding erythema and tenderness improved, no fluctuance, crepitus, or purulent drainage.    Labs:  CBC Latest Ref Rng & Units 01/04/2020 01/03/2020 11/15/2019  WBC 4.0 - 10.5 K/uL 9.0 8.8 8.9  Hemoglobin 13.0 - 17.0 g/dL 12.0(L) 14.8 15.8  Hematocrit 39.0 - 52.0 % 35.7(L) 43.9 45.1  Platelets 150 - 400 K/uL 193 248 337   CMP Latest Ref Rng & Units 01/04/2020 01/03/2020 11/15/2019  Glucose 70 - 99 mg/dL 72 01/13/2020) 407(W)  BUN 6 - 20 mg/dL 8 16 9   Creatinine 0.61 - 1.24 mg/dL 808(U 1.10  Sodium 135 - 145 mmol/L 140 135 140  Potassium 3.5 - 5.1 mmol/L 3.7 5.3(H) 3.4(L)  Chloride 98 - 111 mmol/L 108 99 102  CO2 22 - 32 mmol/L 23 22 25   Calcium 8.9 - 10.3 mg/dL 8.3(L) 9.4 10.2  Total Protein 6.5 - 8.1 g/dL - 7.7 7.7  Total Bilirubin 0.3 - 1.2 mg/dL - 1.5(H) 1.0  Alkaline Phos 38 - 126 U/L - 126 131(H)  AST 15 - 41 U/L - 42(H) 26  ALT 0 - 44 U/L - 23 25     Imaging studies: No new pertinent  imaging studies   Assessment/Plan: (ICD-10's: L69.215) 24 y.o. male overall doing better 1 day s/p bedside I&D for left perineal abscess   - Transition to PO Abx for home  - pain control prn  - recommend some degree of packing to help keep wound open   - further management per primary service    - Discharge planning: Okay for discharge from surgical standpoint, recommend PO Abx x7-10 days +/- pain control, will review wound care instructions with the patient, he can follow up with me in 1-2 weeks for wound re-check   All of the above findings and recommendations were discussed with the patient, and the medical team, and all of patient's questions were answered to his expressed satisfaction.  -- , PA-C [facility] 01/05/2020, 7:23 AM [phone] M-F: 7am - 4pm

## 2020-01-05 NOTE — Progress Notes (Signed)
Inpatient Diabetes Program Recommendations  AACE/ADA: New Consensus Statement on Inpatient Glycemic Control (2015)  Target Ranges:  Prepandial:   less than 140 mg/dL      Peak postprandial:   less than 180 mg/dL (1-2 hours)      Critically ill patients:  140 - 180 mg/dL   Lab Results  Component Value Date   GLUCAP 205 (H) 01/05/2020   HGBA1C 11.1 (H) 01/03/2020  Review of Glycemic Control Results for Wesley [name], Wesley [name] (MRN [id number]) as of 01/05/2020 11:41  Ref. Range 01/04/2020 22:22 01/05/2020 00:26 01/05/2020 04:06 01/05/2020 08:20 01/05/2020 11:37  Glucose-Capillary Latest Ref Range: 70 - 99 mg/dL 706 (H) 237 (H) 628 (H) 221 (H) 205 (H)   Diabetes history: Type 1 DM  Outpatient Diabetes medications: Novolin 70/30 20 units bid, Regular SSI Current orders for Inpatient glycemic control:  Novolog moderate tid with meals and HS Novolog 3 units tid with meals Lantus 12 units daily Inpatient Diabetes Program Recommendations:    Spoke with patient regarding DM.  He has had DM since age 24.  He buys his insulin from [facility].  States that he is on he mothers insurance and that he needs a new meter.  Discussed use of insulin pens with patient and that they can also be purchased from [facility].  Patient states that he has not been checking his blood sugars but knows that he needs to do better.  Encouraged f/u with Jeralyn Ruths and discussed goal A1C. Also discussed that with 70/30 insulin he needs consistent routine and to take medications/ insulin at the same time daily.   Thanks Beryl Meager, RN, BC-ADM Inpatient Diabetes Coordinator Pager [phone] (8a-5p)

## 2020-01-05 NOTE — Discharge Summary (Signed)
Physician Discharge Summary  Wesley [name] FUX:[id number] DOB: Oct 10, 1996 DOA: 01/03/2020  PCP: [facility] [place] date: 01/03/2020 Discharge date: 01/05/2020  Admitted From: Home Disposition: Home  Recommendations for Outpatient Follow-up:  1. Follow up with PCP in 1-2 weeks 2. Follow-up with surgery 1 to 2 weeks 3. Discussed with PCP regarding hemoglobin A1c of 11.1  Home Health: No Equipment/Devices: None Discharge Condition: Stable CODE STATUS: Full Diet recommendation: Carb modified  Brief/Interim Summary: GUR:KYHCWC [name] a 24 y.o.malewwith medical history significant fortype 1 diabetes since age 51, recurrent perineal/scrotal abscesses requiring I&D who presents today for evaluation of recurrent pain and swelling in the left perineal region. Pain is severe and nonradiating. Not resolving with over-the-counter's. He denies fever or chills, has no nausea or vomiting  3/24: Patient seen and examined.  Status post bedside I&D with general surgery.  Tolerated procedure well.  Pain well controlled.  Remains on broad-spectrum antibiotics.  All cultures negative to date.  3/25: Patient seen and examined.  Tolerated I&D well.  Wound examined with bedside nurse.  No drainage noted.  Patient afebrile over interval.  Had a discussion with patient about the recurrence of these cranial abscesses.  Explained the good glycemic control will benefit him substantially.  Explained the meaning of his elevated hemoglobin A1c.  Explained that he needed to get better control of his diabetes otherwise runs the risk of these infections requiring.  He expressed understanding.  He will need to follow-up with primary care within 1 week of discharge as well as surgery for wound check.  Discharge Diagnoses:  Principal Problem:   Perineal abscess Active Problems:   Hyperglycemia due to type 1 diabetes mellitus (HCC)   Lactic acidosis  Perineal abscess Patient has had  recurrent issues with perineal abscess formation Likely secondary to type 1 diabetes Status post bedside I&D with general surgery 01/04/2020 Tolerated procedure well, no complications Postoperative wound check reassuring Broad-spectrum antibiotics discontinued Initiate Bactrim double strength 1 tab twice daily Plan for 7-day antibiotic course Follow-up in general surgery clinic in 1 to 2 weeks Keep wound dry and open per surgery recommendations   Hyperglycemia due to type 1 diabetes mellitus ([place]) Patient has poorly controlled type 1 diabetes mellitus Hemoglobin A1c greater than 11 Explained the need for good glycemic control to patient he understands Increased home NPH insulin to 23 units twice daily Replacement glucometer prescribed Patient will need follow-up with PCP for further insulin regimen titration Discharged home in stable condition  Discharge Instructions  Discharge Instructions    Diet - low sodium heart healthy   Complete by: As directed    Discharge instructions   Complete by: As directed    Your diabetes needs better control.  Your hemoglobin A1c is 11.  Please see your primary care doctor to discuss any adjustments in your insulin regimen.  For now I have increased your NPH insulin to 23 units twice a day.  I have also prescribed 7 days of antibiotics as well as as needed pain medication.  Please only take this pain medication as needed.  Can also take Tylenol and ibuprofen as needed.  Please ensure you are drinking plenty of fluids.  He will need to follow-up with the surgeons in the office in 1 to 2 weeks for wound check.  Please apply some sort of packing material to the wound.  Keep the wound area clean and dry at all times.   Increase activity slowly   Complete by: As directed  Allergies as of 01/05/2020   No Known Allergies     Medication List    TAKE these medications   blood glucose meter kit and supplies Kit Dispense based on patient and  insurance preference. Use up to four times daily as directed. (FOR ICD-9 250.00, 250.01).   glucose blood test strip Use as instructed   insulin NPH-regular Human (70-30) 100 UNIT/ML injection Inject 23 Units into the skin 2 (two) times daily with a meal. What changed:   how much to take  when to take this   insulin regular 100 units/mL injection Commonly known as: NOVOLIN R Inject 0.01-0.1 mLs (1-10 Units total) into the skin 3 (three) times daily before meals. Per sliding scale   Insulin Syringes (Disposable) U-100 0.5 ML Misc 40 Units by Does not apply route 3 (three) times daily.   onetouch ultrasoft lancets Please use as prescribed   oxyCODONE 5 MG immediate release tablet Commonly known as: Roxicodone Take 1 tablet (5 mg total) by mouth every 8 (eight) hours as needed for up to 5 days.   sulfamethoxazole-trimethoprim 800-160 MG tablet Commonly known as: BACTRIM DS Take 1 tablet by mouth every 12 (twelve) hours for 7 days.      Follow-up Information    Tylene Fantasia, PA-C. Go on 01/17/2020.   Specialty: Physician Assistant Why: @ 10:00 a.m. -  s/p I&D, wound check  Contact information: [address] [place] [address] [phone]          No Known Allergies  Consultations:  General surgery   Procedures/Studies: CT ABDOMEN PELVIS W CONTRAST  Result Date: 01/03/2020 CLINICAL DATA:  24 year old male with possible perineal abscess. EXAM: CT ABDOMEN AND PELVIS WITH CONTRAST TECHNIQUE: Multidetector CT imaging of the abdomen and pelvis was performed using the standard protocol following bolus administration of intravenous contrast. CONTRAST:  141m OMNIPAQUE IOHEXOL 300 MG/ML  SOLN COMPARISON:  CT abdomen pelvis dated 03/19/2017. FINDINGS: Lower chest: The visualized lung bases are clear. No intra-abdominal free air or free fluid. Hepatobiliary: No focal liver abnormality is seen. No gallstones, gallbladder wall thickening, or biliary  dilatation. Pancreas: Unremarkable. No pancreatic ductal dilatation or surrounding inflammatory changes. Spleen: Normal in size without focal abnormality. Adrenals/Urinary Tract: Adrenal glands are unremarkable. Kidneys are normal, without renal calculi, focal lesion, or hydronephrosis. Bladder is unremarkable. Stomach/Bowel: There is moderate stool throughout the colon. No bowel obstruction or active inflammation. The appendix is normal. Vascular/Lymphatic: The abdominal aorta IVC are unremarkable. No portal venous gas. There is no adenopathy. Reproductive: The prostate and seminal vesicles are grossly unremarkable. Other: There is induration the subcutaneous soft tissues of the left perineum with a 2.8 x 2.5 cm more focal inflammatory area which may represent phlegmon or developing abscess. Clinical correlation is recommended. No soft tissue air. Musculoskeletal: No acute or significant osseous findings. IMPRESSION: 1. Inflammatory changes of the subcutaneous soft tissues of the left perineum with a phlegmon or developing abscess. Clinical correlation is recommended. No soft tissue air. 2. No bowel obstruction. Normal appendix. Electronically Signed   By: AAnner CreteM.D.   On: 01/03/2020 21:25    (Echo, Carotid, EGD, Colonoscopy, ERCP)    Subjective: Patient seen and examined on day of discharge No complaints, feels well Seen by surgery, stable for discharge home Patient educated on importance of good glycemic control  Discharge Exam: Vitals:   01/04/20 2344 01/05/20 0823  BP: 118/69 137/86  Pulse: 72 67  Resp: 15 20  Temp: 98.5 F (36.9 C)  97.9 F (36.6 C)  SpO2: 100% 100%   Vitals:   01/04/20 1546 01/04/20 1806 01/04/20 2344 01/05/20 0823  BP: 103/66 130/90 118/69 137/86  Pulse: 82 74 72 67  Resp: '18 17 15 20  ' Temp:  98.6 F (37 C) 98.5 F (36.9 C) 97.9 F (36.6 C)  TempSrc:  Oral Oral Oral  SpO2: 97% 100% 100% 100%  Weight:      Height:        General: Pt is alert,  awake, not in acute distress Cardiovascular: RRR, S1/S2 +, no rubs, no gallops Respiratory: CTA bilaterally, no wheezing, no rhonchi Abdominal: Soft, NT, ND, bowel sounds + Extremities: no edema, no cyanosis    The results of significant diagnostics from this hospitalization (including imaging, microbiology, ancillary and laboratory) are listed below for reference.     Microbiology: Recent Results (from the past 240 hour(s))  Blood culture (routine x 2)     Status: None (Preliminary result)   Collection Time: 01/03/20  8:17 PM   Specimen: BLOOD  Result Value Ref Range Status   Specimen Description BLOOD RIGHT ANTECUBITAL  Final   Special Requests   Final    BOTTLES DRAWN AEROBIC AND ANAEROBIC Blood Culture adequate volume   Culture   Final    NO GROWTH 2 DAYS Performed at [facility], [address] [place], [place] [address]    Report Status PENDING  Incomplete  SARS CORONAVIRUS 2 (TAT 6-24 HRS) Nasopharyngeal Nasopharyngeal Swab     Status: None   Collection Time: 01/03/20 11:18 PM   Specimen: Nasopharyngeal Swab  Result Value Ref Range Status   SARS Coronavirus 2 NEGATIVE NEGATIVE Final    Comment: (NOTE) SARS-CoV-2 target nucleic acids are NOT DETECTED. The SARS-CoV-2 RNA is generally detectable in upper and lower respiratory specimens during the acute phase of infection. Negative results do not preclude SARS-CoV-2 infection, do not rule out co-infections with other pathogens, and should not be used as the sole basis for treatment or other patient management decisions. Negative results must be combined with clinical observations, patient history, and epidemiological information. The expected result is Negative. Fact Sheet for Patients: SugarRoll.be Fact Sheet for Healthcare Providers: https://www.woods-mathews.com/ This test is not yet approved or cleared by the [place] FDA and  has been authorized for  detection and/or diagnosis of SARS-CoV-2 by FDA under an Emergency Use Authorization (EUA). This EUA will remain  in effect (meaning this test can be used) for the duration of the COVID-19 declaration under Section 56 4(b)(1) of the Act, 21 U.S.C. section 360bbb-3(b)(1), unless the authorization is terminated or revoked sooner. Performed at [facility], [place] [address] [place], [place] [address]      Labs: BNP (last 3 results) No results for input(s): BNP in the last 8760 hours. Basic Metabolic Panel: Recent Labs  Lab 01/03/20 2016 01/04/20 0844  NA 135 140  K 5.3* 3.7  CL 99 108  CO2 22 23  GLUCOSE 357* 72  BUN 16 8  CREATININE 1.12 0.83  CALCIUM 9.4 8.3*   Liver Function Tests: Recent Labs  Lab 01/03/20 2016  AST 42*  ALT 23  ALKPHOS 126  BILITOT 1.5*  PROT 7.7  ALBUMIN 4.1   No results for input(s): LIPASE, AMYLASE in the last 168 hours. No results for input(s): AMMONIA in the last 168 hours. CBC: Recent Labs  Lab 01/03/20 2016 01/04/20 0844  WBC 8.8 9.0  NEUTROABS 6.4  --   HGB 14.8 12.0*  HCT 43.9 35.7*  MCV 88.7 88.8  PLT 248 193   Cardiac Enzymes: No results for input(s): CKTOTAL, CKMB, CKMBINDEX, TROPONINI in the last 168 hours. BNP: Invalid input(s): POCBNP CBG: Recent Labs  Lab 01/04/20 2008 01/04/20 2222 01/05/20 0026 01/05/20 0406 01/05/20 0820  GLUCAP 205* 225* 272* 310* 221*   D-Dimer No results for input(s): DDIMER in the last 72 hours. Hgb A1c Recent Labs    01/03/20 2016  HGBA1C 11.1*   Lipid Profile No results for input(s): CHOL, HDL, LDLCALC, TRIG, CHOLHDL, LDLDIRECT in the last 72 hours. Thyroid function studies No results for input(s): TSH, T4TOTAL, T3FREE, THYROIDAB in the last 72 hours.  Invalid input(s): FREET3 Anemia work up No results for input(s): VITAMINB12, FOLATE, FERRITIN, TIBC, IRON, RETICCTPCT in the last 72 hours. Urinalysis    Component Value Date/Time   COLORURINE AMBER (A) 11/15/2019 2328    APPEARANCEUR HAZY (A) 11/15/2019 2328   APPEARANCEUR Clear 12/24/2011 1505   LABSPEC 1.033 (H) 11/15/2019 2328   LABSPEC 1.030 12/24/2011 1505   PHURINE 6.0 11/15/2019 2328   GLUCOSEU 50 (A) 11/15/2019 2328   GLUCOSEU >=500 12/24/2011 1505   HGBUR NEGATIVE 11/15/2019 2328   BILIRUBINUR NEGATIVE 11/15/2019 2328   BILIRUBINUR Negative 12/24/2011 1505   KETONESUR 20 (A) 11/15/2019 2328   PROTEINUR 100 (A) 11/15/2019 2328   NITRITE NEGATIVE 11/15/2019 2328   LEUKOCYTESUR SMALL (A) 11/15/2019 2328   LEUKOCYTESUR Negative 12/24/2011 1505   Sepsis Labs Invalid input(s): PROCALCITONIN,  WBC,  LACTICIDVEN Microbiology Recent Results (from the past 240 hour(s))  Blood culture (routine x 2)     Status: None (Preliminary result)   Collection Time: 01/03/20  8:17 PM   Specimen: BLOOD  Result Value Ref Range Status   Specimen Description BLOOD RIGHT ANTECUBITAL  Final   Special Requests   Final    BOTTLES DRAWN AEROBIC AND ANAEROBIC Blood Culture adequate volume   Culture   Final    NO GROWTH 2 DAYS Performed at [facility], [address] [place], [place] [address]    Report Status PENDING  Incomplete  SARS CORONAVIRUS 2 (TAT 6-24 HRS) Nasopharyngeal Nasopharyngeal Swab     Status: None   Collection Time: 01/03/20 11:18 PM   Specimen: Nasopharyngeal Swab  Result Value Ref Range Status   SARS Coronavirus 2 NEGATIVE NEGATIVE Final    Comment: (NOTE) SARS-CoV-2 target nucleic acids are NOT DETECTED. The SARS-CoV-2 RNA is generally detectable in upper and lower respiratory specimens during the acute phase of infection. Negative results do not preclude SARS-CoV-2 infection, do not rule out co-infections with other pathogens, and should not be used as the sole basis for treatment or other patient management decisions. Negative results must be combined with clinical observations, patient history, and epidemiological information. The expected result is Negative. Fact Sheet  for Patients: SugarRoll.be Fact Sheet for Healthcare Providers: https://www.woods-mathews.com/ This test is not yet approved or cleared by the [place] FDA and  has been authorized for detection and/or diagnosis of SARS-CoV-2 by FDA under an Emergency Use Authorization (EUA). This EUA will remain  in effect (meaning this test can be used) for the duration of the COVID-19 declaration under Section 56 4(b)(1) of the Act, 21 U.S.C. section 360bbb-3(b)(1), unless the authorization is terminated or revoked sooner. Performed at [facility], [place] [address], [place], [place] [address]      Time coordinating discharge: Over 30 minutes  SIGNED:   Sidney Ace, MD  [facility] 01/05/2020, 11:36  AM Pager   If 7PM-7AM, please contact night-coverage

## 2020-01-08 LAB — CULTURE, BLOOD (ROUTINE X 2)
Culture: NO GROWTH
Special Requests: ADEQUATE

## 2020-01-17 ENCOUNTER — Encounter: Payer: Self-pay | Admitting: Physician Assistant

## 2020-04-13 ENCOUNTER — Emergency Department
Admission: EM | Admit: 2020-04-13 | Discharge: 2020-04-14 | Disposition: A | Discharge status: Home / Self Care | Payer: BC Managed Care – PPO | Attending: Emergency Medicine | Admitting: Emergency Medicine

## 2020-04-13 ENCOUNTER — Other Ambulatory Visit: Payer: Self-pay

## 2020-04-13 DIAGNOSIS — Z87891 Personal history of nicotine dependence: Secondary | ICD-10-CM | POA: Insufficient documentation

## 2020-04-13 DIAGNOSIS — E860 Dehydration: Secondary | ICD-10-CM | POA: Diagnosis not present

## 2020-04-13 DIAGNOSIS — Z794 Long term (current) use of insulin: Secondary | ICD-10-CM | POA: Insufficient documentation

## 2020-04-13 DIAGNOSIS — E1010 Type 1 diabetes mellitus with ketoacidosis without coma: Secondary | ICD-10-CM | POA: Diagnosis not present

## 2020-04-13 DIAGNOSIS — R5383 Other fatigue: Secondary | ICD-10-CM | POA: Diagnosis present

## 2020-04-13 LAB — BASIC METABOLIC PANEL
Anion gap: 17 — ABNORMAL HIGH (ref 5–15)
BUN: 21 mg/dL — ABNORMAL HIGH (ref 6–20)
CO2: 16 mmol/L — ABNORMAL LOW (ref 22–32)
Calcium: 9.4 mg/dL (ref 8.9–10.3)
Chloride: 100 mmol/L (ref 98–111)
Creatinine, Ser: 1.32 mg/dL — ABNORMAL HIGH (ref 0.61–1.24)
GFR calc Af Amer: >60 mL/min (ref >60)
GFR calc non Af Amer: >60 mL/min (ref >60)
Glucose, Bld: 306 mg/dL — ABNORMAL HIGH (ref 70–99)
Potassium: 4.5 mmol/L (ref 3.5–5.1)
Sodium: 133 mmol/L — ABNORMAL LOW (ref 135–145)

## 2020-04-13 LAB — CBC
HCT: 45.4 % (ref 39.0–52.0)
Hemoglobin: 15.8 g/dL (ref 13.0–17.0)
MCH: 29.5 pg (ref 26.0–34.0)
MCHC: 34.8 g/dL (ref 30.0–36.0)
MCV: 84.9 fL (ref 80.0–100.0)
Platelets: 291 10*3/uL (ref 150–400)
RBC: 5.35 MIL/uL (ref 4.22–5.81)
RDW: 12.4 % (ref 11.5–15.5)
WBC: 7 10*3/uL (ref 4.0–10.5)
nRBC: 0 % (ref 0.0–0.2)

## 2020-04-13 LAB — GLUCOSE, CAPILLARY
Glucose-Capillary: 84 mg/dL (ref 70–99)
Glucose-Capillary: 77 mg/dL (ref 70–99)

## 2020-04-13 LAB — BLOOD GAS, VENOUS
Acid-base deficit: 5.6 mmol/L — ABNORMAL HIGH (ref 0.0–2.0)
Bicarbonate: 20.1 mmol/L (ref 20.0–28.0)
O2 Saturation: 80.6 %
Patient temperature: 37
pCO2, Ven: 39 mmHg — ABNORMAL LOW (ref 44.0–60.0)
pH, Ven: 7.32 (ref 7.250–7.430)
pO2, Ven: 49 mmHg — ABNORMAL HIGH (ref 32.0–45.0)

## 2020-04-13 LAB — TROPONIN I (HIGH SENSITIVITY): Troponin I (High Sensitivity): 4 ng/L (ref <18)

## 2020-04-13 MED ORDER — SODIUM CHLORIDE 0.9 % IV BOLUS
1000.0000 mL | Freq: Once | INTRAVENOUS | Status: AC
  Administered 2020-04-13: 1000 mL via INTRAVENOUS

## 2020-04-13 NOTE — Discharge Instructions (Signed)
Please seek medical attention for any high fevers, chest pain, shortness of breath, change in behavior, persistent vomiting, bloody stool or any other new or concerning symptoms.  

## 2020-04-13 NOTE — ED Provider Notes (Signed)
[facility] Emergency Department Provider Note  ____________________________________________   I have reviewed the triage vital signs and the nursing notes.   HISTORY  Chief Complaint Fatigue   History limited by: Not Limited   HPI Wesley [name] is a 24 y.o. male who presents to the emergency department today with concerns for fatigue and concern for dehydration.  The patient states that his symptoms started this morning.  He does state that [place] and does not think that he necessarily ate or drank his normal amount.  Today is felt dehydrated.  Denies any nausea or vomiting.  Denies any recent illness or fevers.  Patient states he does have a history of diabetic ketoacidosis although this does not remind him of his previous episodes of that.  He says that his blood sugars normally run in the 130s.   Records reviewed. Per medical record review patient has a history of DM, DKA  Past Medical History:  Diagnosis Date  . Diabetes mellitus without complication ([place])    type 1 diabetes    Patient Active Problem List   Diagnosis Date Noted  . Perineal abscess 01/03/2020  . Hyperglycemia due to type 1 diabetes mellitus ([place]) 01/03/2020  . Lactic acidosis 01/03/2020  . DKA, type 1 ([place]) 08/23/2019  . Cellulitis of scrotum 07/18/2019  . Acute renal failure ([place])   . Tobacco use disorder 02/10/2018  . DKA (diabetic ketoacidoses) ([place]) 09/01/2016  . Sepsis ([place]) 10/02/2015  . Scrotal abscess   . Diabetes mellitus with ketoacidosis ([place]) 08/13/2012  . Type 1 diabetes mellitus ([place]) 02/06/2012    Past Surgical History:  Procedure Laterality Date  . HAND SURGERY    . INCISION AND DRAINAGE ABSCESS N/A 10/02/2015   Procedure: INCISION AND DRAINAGE ABSCESS;  Surgeon: Festus Aloe, MD;  Location: [facility] ORS;  Service: Urology;  Laterality: N/A;  . INCISION AND DRAINAGE ABSCESS N/A 07/18/2019   Procedure: INCISION AND DRAINAGE  SCROTAL ABSCESS;  Surgeon: Abbie Sons, MD;  Location: [facility] ORS;  Service: Urology;  Laterality: N/A;  . KNEE ARTHROSCOPY WITH MEDIAL MENISECTOMY Right 03/17/2019   Procedure: KNEE ARTHROSCOPY WITH MEDIAL MENISECTOMY, POSSIBLE CHONDROPLASTY RIGHT - DIABETIC;  Surgeon: Leim Fabry, MD;  Location: [facility] ORS;  Service: Orthopedics;  Laterality: Right;  . TONSILLECTOMY    . TONSILLECTOMY AND ADENOIDECTOMY    . UMBILICAL HERNIA REPAIR      Prior to Admission medications   Medication Sig Start Date End Date Taking? Authorizing Provider  blood glucose meter kit and supplies KIT Dispense based on patient and insurance preference. Use up to four times daily as directed. (FOR ICD-9 250.00, 250.01). 01/05/20   Ralene Muskrat B, MD  glucose blood test strip Use as instructed 08/24/19   Kayleen Memos, DO  insulin NPH-regular Human (70-30) 100 UNIT/ML injection Inject 23 Units into the skin 2 (two) times daily with a meal. 01/05/20   Sreenath, Sudheer B, MD  insulin regular (NOVOLIN R) 100 units/mL injection Inject 0.01-0.1 mLs (1-10 Units total) into the skin 3 (three) times daily before meals. Per sliding scale 08/24/19   Kayleen Memos, DO  Insulin Syringes, Disposable, U-100 0.5 ML MISC 40 Units by Does not apply route 3 (three) times daily. 08/24/19   Kayleen Memos, DO  Lancets [facility] ULTRASOFT) lancets Please use as prescribed 08/24/19   Kayleen Memos, DO    Allergies Patient has no known allergies.  Family History  Problem Relation Age of Onset  .  Diabetes Mellitus II Paternal Grandfather   . Hypertension Mother     Social History Social History   Tobacco Use  . Smoking status: Former Smoker    Packs/day: 0.50    Years: 4.00    Pack years: 2.00    Types: Cigarettes  . Smokeless tobacco: Never Used  Vaping Use  . Vaping Use: Never used  Substance Use Topics  . Alcohol use: Yes    Comment: occasional  . Drug use: Yes    Types: Marijuana    Comment: PT DENIES THIS DURING  PREOP INTERVIEW ON 6-1 BUT IN DR PATELS H&P IT STATES HE DOES MARIJUANA    Review of Systems Constitutional: No fever/chills. Positive for fatigue. Eyes: No visual changes. ENT: No sore throat. Cardiovascular: Denies chest pain. Respiratory: Denies shortness of breath. Gastrointestinal: No abdominal pain.  No nausea, no vomiting.  No diarrhea.   Genitourinary: Negative for dysuria. Musculoskeletal: Negative for back pain. Skin: Negative for rash. Neurological: Negative for headaches, focal weakness or numbness.  ____________________________________________   PHYSICAL EXAM:  VITAL SIGNS: ED Triage Vitals  Enc Vitals Group     BP 04/13/20 1839 (!) 108/55     Pulse Rate 04/13/20 1837 (!) 140     Resp 04/13/20 1837 16     Temp 04/13/20 1837 98.8 F (37.1 C)     Temp Source 04/13/20 1837 Oral     SpO2 04/13/20 1837 97 %     Weight 04/13/20 1838 165 lb (74.8 kg)     Height 04/13/20 1838 '5\' 9"'  (1.753 m)     Head Circumference --      Peak Flow --      Pain Score 04/13/20 1837 0   Constitutional: Alert and oriented.  Eyes: Conjunctivae are normal.  ENT      Head: Normocephalic and atraumatic.      Nose: No congestion/rhinnorhea.      Mouth/Throat: Mucous membranes are moist.      Neck: No stridor. Hematological/Lymphatic/Immunilogical: No cervical lymphadenopathy. Cardiovascular: Tachycardic, regular rhythm.  No murmurs, rubs, or gallops.  Respiratory: Normal respiratory effort without tachypnea nor retractions. Breath sounds are clear and equal bilaterally. No wheezes/rales/rhonchi. Gastrointestinal: Soft and non tender. No rebound. No guarding.  Genitourinary: Deferred Musculoskeletal: Normal range of motion in all extremities. No lower extremity edema. Neurologic:  Normal speech and language. No gross focal neurologic deficits are appreciated.  Skin:  Skin is warm, dry and intact. No rash noted. Psychiatric: Mood and affect are normal. Speech and behavior are normal.  Patient exhibits appropriate insight and judgment.  ____________________________________________    LABS (pertinent positives/negatives)  Trop hs 4 CBC wbc 7.0,  hgb 15.8, plt 291 BMP na 133, glu 306, cr 1.32, anion gap 17 VBG, pH 7.32 ____________________________________________   EKG  I, Nance Pear, attending physician, personally viewed and interpreted this EKG  EKG Time: 1839 Rate: 132 Rhythm: sinus tachycardia Axis: normal Intervals: qtc 432 QRS: narrow ST changes: no st elevation Impression: abnormal ekg  ____________________________________________    RADIOLOGY  None  ____________________________________________   PROCEDURES  Procedures  ____________________________________________   INITIAL IMPRESSION / ASSESSMENT AND PLAN / ED COURSE  Pertinent labs & imaging results that were available during my care of the patient were reviewed by me and considered in my medical decision making (see chart for details).   Patient presented to the emergency department today because of concerns for dehydration.  Patient does have history of diabetes.  Has had DKA in the past  although he states that today's episode does not remind him of that.  Blood work does show a slight anion gap and elevated blood sugar.  VBG however with a normal pH.  This point however the lack of nausea or vomiting and the patient does not feel like his previous episodes I have lower suspicion for DKA.  Patient was given IV fluids and his sugars did come down.  Will recheck BMP and if anion gap is normal I do think would be reasonable for him to be discharged home.  My guess would be that the initial anion gap was due simply to dehydration.  ___________________________________________   FINAL CLINICAL IMPRESSION(S) / ED DIAGNOSES  Final diagnoses:  Dehydration     Note: This dictation was prepared with Dragon dictation. Any transcriptional errors that result from this process are  unintentional     Nance Pear, MD 04/13/20 2340

## 2020-04-13 NOTE — ED Triage Notes (Signed)
Pt c/o of fatigue, not wanting to eat, and drowsiness that began today. Hx DM type 1. States CBG's have been good. Thinks he's dehydrated. Denies vomiting/diarrhea. HR 143 in triage. A&O, ambulatory.

## 2020-04-13 NOTE — ED Notes (Signed)
Pt reports fatigue since approx 1100 this AM. Pt states he is a type 1 diabetic, and has had to be hospitalized for DKA before, says this feels similar to how it started last time.   Pt denies n/v/d, abdominal pain, states he just feels tired and dehydrated.

## 2020-04-14 LAB — URINALYSIS, COMPLETE (UACMP) WITH MICROSCOPIC
Bacteria, UA: NONE SEEN
Bilirubin Urine: NEGATIVE
Glucose, UA: >=500 mg/dL — AB
Hgb urine dipstick: NEGATIVE
Ketones, ur: 80 mg/dL — AB
Leukocytes,Ua: NEGATIVE
Nitrite: NEGATIVE
Protein, ur: NEGATIVE mg/dL
Specific Gravity, Urine: 1.025 (ref 1.005–1.030)
pH: 5 (ref 5.0–8.0)

## 2020-04-14 LAB — GLUCOSE, CAPILLARY: Glucose-Capillary: 80 mg/dL (ref 70–99)

## 2020-04-14 LAB — BASIC METABOLIC PANEL
Anion gap: 7 (ref 5–15)
BUN: 16 mg/dL (ref 6–20)
CO2: 21 mmol/L — ABNORMAL LOW (ref 22–32)
Calcium: 7.8 mg/dL — ABNORMAL LOW (ref 8.9–10.3)
Chloride: 110 mmol/L (ref 98–111)
Creatinine, Ser: 0.82 mg/dL (ref 0.61–1.24)
GFR calc Af Amer: >60 mL/min (ref >60)
GFR calc non Af Amer: >60 mL/min (ref >60)
Glucose, Bld: 74 mg/dL (ref 70–99)
Potassium: 3.3 mmol/L — ABNORMAL LOW (ref 3.5–5.1)
Sodium: 138 mmol/L (ref 135–145)

## 2020-04-14 LAB — TROPONIN I (HIGH SENSITIVITY): Troponin I (High Sensitivity): 4 ng/L (ref <18)

## 2020-04-14 NOTE — ED Provider Notes (Signed)
-----------------------------------------   2:58 AM on 04/14/2020 -----------------------------------------  Anion gap has cleared on repeat metabolic panel.  UA does not demonstrate infection.  Patient is feeling better and eager for discharge home.  Strict return precautions given.  Patient verbalizes understanding agrees with plan of care.   Irean Hong, MD 04/14/20 [phone]

## 2020-04-14 NOTE — ED Notes (Signed)
E signature pad not working. Pt educated on discharge instructions and verbalized understanding.  

## 2020-05-02 ENCOUNTER — Other Ambulatory Visit: Payer: Self-pay

## 2020-05-02 ENCOUNTER — Encounter: Payer: Self-pay | Admitting: Emergency Medicine

## 2020-05-02 ENCOUNTER — Emergency Department
Admission: EM | Admit: 2020-05-02 | Discharge: 2020-05-03 | Disposition: A | Discharge status: Home / Self Care | Payer: BC Managed Care – PPO | Attending: Emergency Medicine | Admitting: Emergency Medicine

## 2020-05-02 DIAGNOSIS — R112 Nausea with vomiting, unspecified: Secondary | ICD-10-CM | POA: Diagnosis present

## 2020-05-02 DIAGNOSIS — F1210 Cannabis abuse, uncomplicated: Secondary | ICD-10-CM | POA: Diagnosis not present

## 2020-05-02 DIAGNOSIS — Z87891 Personal history of nicotine dependence: Secondary | ICD-10-CM | POA: Diagnosis not present

## 2020-05-02 DIAGNOSIS — E1065 Type 1 diabetes mellitus with hyperglycemia: Secondary | ICD-10-CM | POA: Diagnosis not present

## 2020-05-02 DIAGNOSIS — E860 Dehydration: Secondary | ICD-10-CM | POA: Diagnosis not present

## 2020-05-02 DIAGNOSIS — Z794 Long term (current) use of insulin: Secondary | ICD-10-CM | POA: Insufficient documentation

## 2020-05-02 DIAGNOSIS — T5191XA Toxic effect of unspecified alcohol, accidental (unintentional), initial encounter: Secondary | ICD-10-CM | POA: Diagnosis not present

## 2020-05-02 DIAGNOSIS — R197 Diarrhea, unspecified: Secondary | ICD-10-CM | POA: Insufficient documentation

## 2020-05-02 LAB — URINALYSIS, COMPLETE (UACMP) WITH MICROSCOPIC
Bacteria, UA: NONE SEEN
Bilirubin Urine: NEGATIVE
Glucose, UA: >=500 mg/dL — AB
Hgb urine dipstick: NEGATIVE
Ketones, ur: 80 mg/dL — AB
Leukocytes,Ua: NEGATIVE
Nitrite: NEGATIVE
Protein, ur: 30 mg/dL — AB
Specific Gravity, Urine: 1.035 — ABNORMAL HIGH (ref 1.005–1.030)
pH: 7 (ref 5.0–8.0)

## 2020-05-02 LAB — BASIC METABOLIC PANEL
Anion gap: 12 (ref 5–15)
BUN: 13 mg/dL (ref 6–20)
CO2: 27 mmol/L (ref 22–32)
Calcium: 9.5 mg/dL (ref 8.9–10.3)
Chloride: 101 mmol/L (ref 98–111)
Creatinine, Ser: 1.02 mg/dL (ref 0.61–1.24)
GFR calc Af Amer: >60 mL/min (ref >60)
GFR calc non Af Amer: >60 mL/min (ref >60)
Glucose, Bld: 318 mg/dL — ABNORMAL HIGH (ref 70–99)
Potassium: 3.9 mmol/L (ref 3.5–5.1)
Sodium: 140 mmol/L (ref 135–145)

## 2020-05-02 LAB — CBC
HCT: 43.3 % (ref 39.0–52.0)
Hemoglobin: 15.3 g/dL (ref 13.0–17.0)
MCH: 30 pg (ref 26.0–34.0)
MCHC: 35.3 g/dL (ref 30.0–36.0)
MCV: 84.9 fL (ref 80.0–100.0)
Platelets: 251 10*3/uL (ref 150–400)
RBC: 5.1 MIL/uL (ref 4.22–5.81)
RDW: 13.7 % (ref 11.5–15.5)
WBC: 6.2 10*3/uL (ref 4.0–10.5)
nRBC: 0 % (ref 0.0–0.2)

## 2020-05-02 LAB — GLUCOSE, CAPILLARY: Glucose-Capillary: 299 mg/dL — ABNORMAL HIGH (ref 70–99)

## 2020-05-02 MED ORDER — SODIUM CHLORIDE 0.9 % IV BOLUS
1000.0000 mL | Freq: Once | INTRAVENOUS | Status: AC
  Administered 2020-05-02: 1000 mL via INTRAVENOUS

## 2020-05-02 MED ORDER — ONDANSETRON HCL 4 MG/2ML IJ SOLN
4.0000 mg | Freq: Once | INTRAMUSCULAR | Status: AC
  Administered 2020-05-03: 4 mg via INTRAVENOUS
  Filled 2020-05-02: qty 2

## 2020-05-02 MED ORDER — ONDANSETRON HCL 4 MG/2ML IJ SOLN
4.0000 mg | Freq: Once | INTRAMUSCULAR | Status: AC
  Administered 2020-05-02: 4 mg via INTRAVENOUS
  Filled 2020-05-02: qty 2

## 2020-05-02 MED ORDER — LACTATED RINGERS IV BOLUS
1000.0000 mL | Freq: Once | INTRAVENOUS | Status: AC
  Administered 2020-05-03: 1000 mL via INTRAVENOUS

## 2020-05-02 MED ORDER — FAMOTIDINE IN NACL 20-0.9 MG/50ML-% IV SOLN
20.0000 mg | Freq: Once | INTRAVENOUS | Status: AC
  Administered 2020-05-03: 20 mg via INTRAVENOUS
  Filled 2020-05-02: qty 50

## 2020-05-02 NOTE — ED Triage Notes (Signed)
Pt to ED from home c/o nausea and vomiting x1 and dehydration.  States was drinking last night, nausea this morning and feels dehydrated.  Type 1 diabetes hx, CBG at home last night around 200.  Pt A&Ox4, chest rise even and unlabored, skin WNL.

## 2020-05-03 LAB — BLOOD GAS, VENOUS
Acid-base deficit: 1.1 mmol/L (ref 0.0–2.0)
Bicarbonate: 22.6 mmol/L (ref 20.0–28.0)
O2 Saturation: 82.1 %
Patient temperature: 37
pCO2, Ven: 34 mmHg — ABNORMAL LOW (ref 44.0–60.0)
pH, Ven: 7.43 (ref 7.250–7.430)
pO2, Ven: 45 mmHg (ref 32.0–45.0)

## 2020-05-03 LAB — GLUCOSE, CAPILLARY
Glucose-Capillary: 394 mg/dL — ABNORMAL HIGH (ref 70–99)
Glucose-Capillary: 312 mg/dL — ABNORMAL HIGH (ref 70–99)
Glucose-Capillary: 186 mg/dL — ABNORMAL HIGH (ref 70–99)

## 2020-05-03 LAB — BETA-HYDROXYBUTYRIC ACID: Beta-Hydroxybutyric Acid: 2.83 mmol/L — ABNORMAL HIGH (ref 0.05–0.27)

## 2020-05-03 MED ORDER — LACTATED RINGERS IV BOLUS
1000.0000 mL | Freq: Once | INTRAVENOUS | Status: AC
  Administered 2020-05-03: 1000 mL via INTRAVENOUS

## 2020-05-03 MED ORDER — INSULIN ASPART 100 UNIT/ML ~~LOC~~ SOLN
10.0000 [IU] | Freq: Once | SUBCUTANEOUS | Status: AC
  Administered 2020-05-03: 10 [IU] via INTRAVENOUS
  Filled 2020-05-03: qty 1

## 2020-05-03 MED ORDER — ALUM & MAG HYDROXIDE-SIMETH 200-200-20 MG/5ML PO SUSP
30.0000 mL | Freq: Once | ORAL | Status: AC
  Administered 2020-05-03: 30 mL via ORAL
  Filled 2020-05-03: qty 30

## 2020-05-03 MED ORDER — INSULIN ASPART 100 UNIT/ML ~~LOC~~ SOLN
20.0000 [IU] | Freq: Once | SUBCUTANEOUS | Status: AC
  Administered 2020-05-03: 20 [IU] via INTRAVENOUS
  Filled 2020-05-03: qty 1

## 2020-05-03 NOTE — ED Provider Notes (Signed)
[facility] Emergency Department Provider Note  ____________________________________________  Time seen: Approximately 12:12 AM  I have reviewed the triage vital signs and the nursing notes.   HISTORY  Chief Complaint Nausea, Abdominal Pain, and Dehydration   HPI Wesley [name] is a 24 y.o. male with a history of type 1 diabetes who presents for evaluation of nausea, vomiting, dehydration.  Patient reports that he drank an entire bottle of Hennessy yesterday.  Had several episodes of vomiting today and was pretty hung over.  Did not really eat much today.  Is feeling still pretty nauseous.  Is complaining of feeling acid reflux in the back of his throat and his epigastric region.  Denies any chest pain or abdominal pain, no fever chills, no diarrhea or constipation.  No melena, hematochezia, coffee-ground emesis, or hematemesis.  Patient came in for IV fluids due to concern of possible early DKA.   Past Medical History:  Diagnosis Date   Diabetes mellitus without complication ([place])    type 1 diabetes    Patient Active Problem List   Diagnosis Date Noted   Perineal abscess 01/03/2020   Hyperglycemia due to type 1 diabetes mellitus ([place]) 01/03/2020   Lactic acidosis 01/03/2020   DKA, type 1 ([place]) 08/23/2019   Cellulitis of scrotum 07/18/2019   Acute renal failure ([place])    Tobacco use disorder 02/10/2018   DKA (diabetic ketoacidoses) ([place]) 09/01/2016   Sepsis ([place]) 10/02/2015   Scrotal abscess    Diabetes mellitus with ketoacidosis ([place]) 08/13/2012   Type 1 diabetes mellitus ([place]) 02/06/2012    Past Surgical History:  Procedure Laterality Date   HAND SURGERY     INCISION AND DRAINAGE ABSCESS N/A 10/02/2015   Procedure: INCISION AND DRAINAGE ABSCESS;  Surgeon: Festus Aloe, MD;  Location: [facility] ORS;  Service: Urology;  Laterality: N/A;   INCISION AND DRAINAGE ABSCESS N/A 07/18/2019   Procedure: INCISION AND DRAINAGE SCROTAL  ABSCESS;  Surgeon: Abbie Sons, MD;  Location: [facility] ORS;  Service: Urology;  Laterality: N/A;   KNEE ARTHROSCOPY WITH MEDIAL MENISECTOMY Right 03/17/2019   Procedure: KNEE ARTHROSCOPY WITH MEDIAL MENISECTOMY, POSSIBLE CHONDROPLASTY RIGHT - DIABETIC;  Surgeon: Leim Fabry, MD;  Location: [facility] ORS;  Service: Orthopedics;  Laterality: Right;   TONSILLECTOMY     TONSILLECTOMY AND ADENOIDECTOMY     UMBILICAL HERNIA REPAIR      Prior to Admission medications   Medication Sig Start Date End Date Taking? Authorizing Provider  blood glucose meter kit and supplies KIT Dispense based on patient and insurance preference. Use up to four times daily as directed. (FOR ICD-9 250.00, 250.01). 01/05/20   Ralene Muskrat B, MD  glucose blood test strip Use as instructed 08/24/19   Kayleen Memos, DO  insulin NPH-regular Human (70-30) 100 UNIT/ML injection Inject 23 Units into the skin 2 (two) times daily with a meal. 01/05/20   Sreenath, Sudheer B, MD  insulin regular (NOVOLIN R) 100 units/mL injection Inject 0.01-0.1 mLs (1-10 Units total) into the skin 3 (three) times daily before meals. Per sliding scale 08/24/19   Kayleen Memos, DO  Insulin Syringes, Disposable, U-100 0.5 ML MISC 40 Units by Does not apply route 3 (three) times daily. 08/24/19   Kayleen Memos, DO  Lancets [facility] ULTRASOFT) lancets Please use as prescribed 08/24/19   Kayleen Memos, DO    Allergies Patient has no known allergies.  Family History  Problem Relation Age of Onset   Diabetes Mellitus II Paternal Grandfather  Hypertension Mother     Social History Social History   Tobacco Use   Smoking status: Former Smoker    Packs/day: 0.50    Years: 4.00    Pack years: 2.00    Types: Cigarettes   Smokeless tobacco: Never Used  Scientific laboratory technician Use: Never used  Substance Use Topics   Alcohol use: Yes    Comment: occasional, last drank 05/01/20   Drug use: Yes    Types: Marijuana    Comment: PT DENIES  THIS DURING PREOP INTERVIEW ON 6-1 BUT IN DR PATELS H&P IT STATES HE DOES MARIJUANA    Review of Systems  Constitutional: Negative for fever. Eyes: Negative for visual changes. ENT: Negative for sore throat. Neck: No neck pain  Cardiovascular: Negative for chest pain. Respiratory: Negative for shortness of breath. Gastrointestinal: Negative for abdominal pain or diarrhea. + Nausea and vomiting Genitourinary: Negative for dysuria. Musculoskeletal: Negative for back pain. Skin: Negative for rash. Neurological: Negative for headaches, weakness or numbness. Psych: No SI or HI  ____________________________________________   PHYSICAL EXAM:  VITAL SIGNS: ED Triage Vitals  Enc Vitals Group     BP 05/02/20 2028 (!) 150/100     Pulse Rate 05/02/20 2028 64     Resp 05/02/20 2028 20     Temp 05/02/20 2028 99.6 F (37.6 C)     Temp Source 05/02/20 2028 Oral     SpO2 --      Weight 05/02/20 2029 170 lb (77.1 kg)     Height 05/02/20 2029 5' 10" (1.778 m)     Head Circumference --      Peak Flow --      Pain Score 05/02/20 2029 0     Pain Loc --      Pain Edu? --      Excl. in [place]? --     Constitutional: Alert and oriented. Well appearing and in no apparent distress. HEENT:      Head: Normocephalic and atraumatic.         Eyes: Conjunctivae are normal. Sclera is non-icteric.       Mouth/Throat: Mucous membranes are moist.       Neck: Supple with no signs of meningismus. Cardiovascular: Regular rate and rhythm. No murmurs, gallops, or rubs. 2+ symmetrical distal pulses are present in all extremities. No JVD. Respiratory: Normal respiratory effort. Lungs are clear to auscultation bilaterally. No wheezes, crackles, or rhonchi.  Gastrointestinal: Soft, non tender, and non distended with positive bowel sounds. No rebound or guarding. Musculoskeletal: Nontender with normal range of motion in all extremities. No edema, cyanosis, or erythema of extremities. Neurologic: Normal speech and  language. Face is symmetric. Moving all extremities. No gross focal neurologic deficits are appreciated. Skin: Skin is warm, dry and intact. No rash noted. Psychiatric: Mood and affect are normal. Speech and behavior are normal.  ____________________________________________   LABS (all labs ordered are listed, but only abnormal results are displayed)  Labs Reviewed  GLUCOSE, CAPILLARY - Abnormal; Notable for the following components:      Result Value   Glucose-Capillary 299 (*)    All other components within normal limits  BASIC METABOLIC PANEL - Abnormal; Notable for the following components:   Glucose, Bld 318 (*)    All other components within normal limits  URINALYSIS, COMPLETE (UACMP) WITH MICROSCOPIC - Abnormal; Notable for the following components:   Color, Urine YELLOW (*)    APPearance CLEAR (*)    Specific Gravity, Urine 1.035 (*)  Glucose, UA >=500 (*)    Ketones, ur 80 (*)    Protein, ur 30 (*)    All other components within normal limits  BETA-HYDROXYBUTYRIC ACID - Abnormal; Notable for the following components:   Beta-Hydroxybutyric Acid 2.83 (*)    All other components within normal limits  BLOOD GAS, VENOUS - Abnormal; Notable for the following components:   pCO2, Ven 34 (*)    All other components within normal limits  GLUCOSE, CAPILLARY - Abnormal; Notable for the following components:   Glucose-Capillary 394 (*)    All other components within normal limits  GLUCOSE, CAPILLARY - Abnormal; Notable for the following components:   Glucose-Capillary 312 (*)    All other components within normal limits  GLUCOSE, CAPILLARY - Abnormal; Notable for the following components:   Glucose-Capillary 186 (*)    All other components within normal limits  CBC  CBG MONITORING, ED  CBG MONITORING, ED  CBG MONITORING, ED   ____________________________________________  EKG  ED ECG REPORT I, Rudene Re, the attending physician, personally viewed and interpreted  this ECG.  Sinus bradycardia, rate of 58, normal intervals, normal axis, no ST elevations or depressions.  Otherwise normal EKG ____________________________________________  RADIOLOGY  none  ____________________________________________   PROCEDURES  Procedure(s) performed: None Procedures Critical Care performed:  None ____________________________________________   INITIAL IMPRESSION / ASSESSMENT AND PLAN / ED COURSE  24 y.o. male with a history of type 1 diabetes who presents for evaluation of nausea, vomiting, dehydration the setting of large alcohol consumption last night.  Patient is well-appearing, does not look dehydrated, abdomen is soft with no tenderness, he is afebrile and not tachycardic.  Labs showing hyperglycemia no evidence of DKA, normal VBG, no significant electrolyte derangements.  Differential diagnosis including alcoholic gastritis versus GERD versus alcohol intoxication/ poisoning.     Will give IV bolus, zofran, and IV pepcid. Will recheck CBG after hydration.   Old medical records reviewed.   _________________________ 4:31 AM on 05/03/2020 -----------------------------------------  After several boluses and 10 units of IV insulin patient's blood glucose was too elevated at 312.  At that point I recommend admission to the hospital however patient did not want to stay.  He agreed to a repeat bolus and repeat insulin bolus.  After 4 L of fluid and another 20 units of IV insulin patient's blood glucose is now 186.  He is tolerating p.o. and feels markedly improved.  He continues to endorse that he wants to be discharged and not admitted to the hospital.  At this point I think patient is safe for discharge.  Recommended increase hydration at home, close monitoring of blood glucose, use of his insulin as prescribed, and discussed my return precautions with patient.     _____________________________________________ Please note:  Patient was evaluated in  Emergency Department today for the symptoms described in the history of present illness. Patient was evaluated in the context of the global COVID-19 pandemic, which necessitated consideration that the patient might be at risk for infection with the SARS-CoV-2 virus that causes COVID-19. Institutional protocols and algorithms that pertain to the evaluation of patients at risk for COVID-19 are in a state of rapid change based on information released by regulatory bodies including the CDC and federal and state organizations. These policies and algorithms were followed during the patient's care in the ED.  Some ED evaluations and interventions may be delayed as a result of limited staffing during the pandemic.   [place] Controlled Substance Database was reviewed by  me. ____________________________________________   FINAL CLINICAL IMPRESSION(S) / ED DIAGNOSES   Final diagnoses:  Unintentional poisoning by alcohol, initial encounter  Hyperglycemia due to type 1 diabetes mellitus ([place])  Dehydration      NEW MEDICATIONS STARTED DURING THIS VISIT:  ED Discharge Orders    None       Note:  This document was prepared using Dragon voice recognition software and may include unintentional dictation errors.    Alfred Levins, [place], MD 05/03/20 [phone]

## 2020-05-03 NOTE — ED Notes (Signed)
Patient discharged to home per MD order. Patient in stable condition, and deemed medically cleared by ED provider for discharge. Discharge instructions reviewed with patient/family using "Teach Back"; verbalized understanding of medication education and administration, and information about follow-up care. Denies further concerns. ° °

## 2020-05-04 ENCOUNTER — Encounter: Payer: Self-pay | Admitting: Emergency Medicine

## 2020-05-04 ENCOUNTER — Emergency Department: Payer: BC Managed Care – PPO

## 2020-05-04 ENCOUNTER — Emergency Department
Admission: EM | Admit: 2020-05-04 | Discharge: 2020-05-04 | Disposition: A | Discharge status: Home / Self Care | Payer: BC Managed Care – PPO | Attending: Student in an Organized Health Care Education/Training Program | Admitting: Student in an Organized Health Care Education/Training Program

## 2020-05-04 ENCOUNTER — Other Ambulatory Visit: Payer: Self-pay

## 2020-05-04 DIAGNOSIS — R112 Nausea with vomiting, unspecified: Secondary | ICD-10-CM

## 2020-05-04 DIAGNOSIS — Z87891 Personal history of nicotine dependence: Secondary | ICD-10-CM | POA: Insufficient documentation

## 2020-05-04 DIAGNOSIS — E119 Type 2 diabetes mellitus without complications: Secondary | ICD-10-CM | POA: Diagnosis not present

## 2020-05-04 DIAGNOSIS — R109 Unspecified abdominal pain: Secondary | ICD-10-CM | POA: Diagnosis present

## 2020-05-04 DIAGNOSIS — F1290 Cannabis use, unspecified, uncomplicated: Secondary | ICD-10-CM | POA: Diagnosis not present

## 2020-05-04 DIAGNOSIS — R739 Hyperglycemia, unspecified: Secondary | ICD-10-CM | POA: Diagnosis not present

## 2020-05-04 DIAGNOSIS — Z794 Long term (current) use of insulin: Secondary | ICD-10-CM | POA: Diagnosis not present

## 2020-05-04 LAB — BLOOD GAS, VENOUS
Acid-Base Excess: 0.5 mmol/L (ref 0.0–2.0)
Bicarbonate: 21.4 mmol/L (ref 20.0–28.0)
O2 Saturation: 92 %
Patient temperature: 37
pCO2, Ven: 25 mmHg — ABNORMAL LOW (ref 44.0–60.0)
pH, Ven: 7.54 — ABNORMAL HIGH (ref 7.250–7.430)
pO2, Ven: 55 mmHg — ABNORMAL HIGH (ref 32.0–45.0)

## 2020-05-04 LAB — CBC
HCT: 41.7 % (ref 39.0–52.0)
Hemoglobin: 14.2 g/dL (ref 13.0–17.0)
MCH: 29.5 pg (ref 26.0–34.0)
MCHC: 34.1 g/dL (ref 30.0–36.0)
MCV: 86.5 fL (ref 80.0–100.0)
Platelets: 191 10*3/uL (ref 150–400)
RBC: 4.82 MIL/uL (ref 4.22–5.81)
RDW: 13.5 % (ref 11.5–15.5)
WBC: 5.5 10*3/uL (ref 4.0–10.5)
nRBC: 0 % (ref 0.0–0.2)

## 2020-05-04 LAB — COMPREHENSIVE METABOLIC PANEL
ALT: 52 U/L — ABNORMAL HIGH (ref 0–44)
AST: 68 U/L — ABNORMAL HIGH (ref 15–41)
Albumin: 4.4 g/dL (ref 3.5–5.0)
Alkaline Phosphatase: 90 U/L (ref 38–126)
Anion gap: 16 — ABNORMAL HIGH (ref 5–15)
BUN: 13 mg/dL (ref 6–20)
CO2: 20 mmol/L — ABNORMAL LOW (ref 22–32)
Calcium: 9.5 mg/dL (ref 8.9–10.3)
Chloride: 101 mmol/L (ref 98–111)
Creatinine, Ser: 1.07 mg/dL (ref 0.61–1.24)
GFR calc Af Amer: >60 mL/min (ref >60)
GFR calc non Af Amer: >60 mL/min (ref >60)
Glucose, Bld: 310 mg/dL — ABNORMAL HIGH (ref 70–99)
Potassium: 4 mmol/L (ref 3.5–5.1)
Sodium: 137 mmol/L (ref 135–145)
Total Bilirubin: 1.5 mg/dL — ABNORMAL HIGH (ref 0.3–1.2)
Total Protein: 7.1 g/dL (ref 6.5–8.1)

## 2020-05-04 LAB — LIPASE, BLOOD: Lipase: 33 U/L (ref 11–51)

## 2020-05-04 MED ORDER — SODIUM CHLORIDE 0.9 % IV BOLUS: 1000.0000 mL | Freq: Once | INTRAVENOUS | Status: DC

## 2020-05-04 MED ORDER — SODIUM CHLORIDE 0.9% FLUSH: 3.0000 mL | Freq: Once | INTRAVENOUS | Status: DC

## 2020-05-04 MED ORDER — PROMETHAZINE HCL 25 MG/ML IJ SOLN
12.5000 mg | Freq: Four times a day (QID) | INTRAMUSCULAR | Status: DC | PRN
  Filled 2020-05-04: qty 1

## 2020-05-04 MED ORDER — INSULIN ASPART 100 UNIT/ML ~~LOC~~ SOLN
10.0000 [IU] | Freq: Once | SUBCUTANEOUS | Status: DC
  Filled 2020-05-04: qty 1

## 2020-05-04 MED ORDER — PANTOPRAZOLE SODIUM 40 MG IV SOLR
40.0000 mg | Freq: Once | INTRAVENOUS | Status: DC
  Filled 2020-05-04: qty 40

## 2020-05-04 NOTE — ED Notes (Signed)
RN attempted to contact pt by phone a third [phone] with no answer

## 2020-05-04 NOTE — ED Triage Notes (Signed)
Pt presents to ED via POV. Pt c/o generalized abdominal pain at this time, pt also c/o nausea and dry heaves. Pt with noted tachypnea. Pt states blood sugars at home have been well controlled with blood sugars in the 100's. Pt with approx 50cc's of green liquid in emesis bag on arrival to triage room .  Pt denies active vomiting at home, states has just been dry heaving at home.

## 2020-05-04 NOTE — ED Notes (Signed)
CBG 279 

## 2020-05-04 NOTE — ED Notes (Signed)
RN left pt to verify medication with second RN, when RN come back to start IV and admin meds pt was walking down hallway and turning right toward exit. RN was called into another pt room at that time. When RN returned to 18[facility] to provide pt care, pt no longer there. RN called radiology, they states pt was not transported anywhere by their staff members. Pt current still not in the designated area/stretcher where he was placed for care.

## 2020-05-04 NOTE — ED Notes (Signed)
RN attempted to contact pt by phone twice with no answer.

## 2020-05-04 NOTE — ED Provider Notes (Signed)
[facility] Emergency Department Provider Note    First MD Initiated Contact with Patient 05/04/20 1513     (approximate)  I have reviewed the triage vital signs and the nursing notes.   HISTORY  Chief Complaint Abdominal Pain and Emesis    HPI Wesley [name] is a 24 y.o. male presents to the ER for persistent nausea vomiting and concern for dehydration and has been having some elevated blood sugars.  Does have a history of DKA but states that this feels different.  Denies any abdominal pain.  Did have some improvement with Carafate.  Has not had any fevers or chills.  Denies any chest pain or shortness of breath.    Past Medical History:  Diagnosis Date  . Diabetes mellitus without complication ([place])    type 1 diabetes   Family History  Problem Relation Age of Onset  . Diabetes Mellitus II Paternal Grandfather   . Hypertension Mother    Past Surgical History:  Procedure Laterality Date  . HAND SURGERY    . INCISION AND DRAINAGE ABSCESS N/A 10/02/2015   Procedure: INCISION AND DRAINAGE ABSCESS;  Surgeon: Festus Aloe, MD;  Location: [facility] ORS;  Service: Urology;  Laterality: N/A;  . INCISION AND DRAINAGE ABSCESS N/A 07/18/2019   Procedure: INCISION AND DRAINAGE SCROTAL ABSCESS;  Surgeon: Abbie Sons, MD;  Location: [facility] ORS;  Service: Urology;  Laterality: N/A;  . KNEE ARTHROSCOPY WITH MEDIAL MENISECTOMY Right 03/17/2019   Procedure: KNEE ARTHROSCOPY WITH MEDIAL MENISECTOMY, POSSIBLE CHONDROPLASTY RIGHT - DIABETIC;  Surgeon: Leim Fabry, MD;  Location: [facility] ORS;  Service: Orthopedics;  Laterality: Right;  . TONSILLECTOMY    . TONSILLECTOMY AND ADENOIDECTOMY    . UMBILICAL HERNIA REPAIR     Patient Active Problem List   Diagnosis Date Noted  . Perineal abscess 01/03/2020  . Hyperglycemia due to type 1 diabetes mellitus ([place]) 01/03/2020  . Lactic acidosis 01/03/2020  . DKA, type 1 ([place]) 08/23/2019  . Cellulitis of scrotum 07/18/2019  .  Acute renal failure ([place])   . Tobacco use disorder 02/10/2018  . DKA (diabetic ketoacidoses) ([place]) 09/01/2016  . Sepsis ([place]) 10/02/2015  . Scrotal abscess   . Diabetes mellitus with ketoacidosis ([place]) 08/13/2012  . Type 1 diabetes mellitus ([place]) 02/06/2012      Prior to Admission medications   Medication Sig Start Date End Date Taking? Authorizing Provider  blood glucose meter kit and supplies KIT Dispense based on patient and insurance preference. Use up to four times daily as directed. (FOR ICD-9 250.00, 250.01). 01/05/20   Ralene Muskrat B, MD  glucose blood test strip Use as instructed 08/24/19   Kayleen Memos, DO  insulin NPH-regular Human (70-30) 100 UNIT/ML injection Inject 23 Units into the skin 2 (two) times daily with a meal. 01/05/20   Sreenath, Sudheer B, MD  insulin regular (NOVOLIN R) 100 units/mL injection Inject 0.01-0.1 mLs (1-10 Units total) into the skin 3 (three) times daily before meals. Per sliding scale 08/24/19   Kayleen Memos, DO  Insulin Syringes, Disposable, U-100 0.5 ML MISC 40 Units by Does not apply route 3 (three) times daily. 08/24/19   Kayleen Memos, DO  Lancets [facility] ULTRASOFT) lancets Please use as prescribed 08/24/19   Kayleen Memos, DO    Allergies Patient has no known allergies.    Social History Social History   Tobacco Use  . Smoking status: Former Smoker    Packs/day: 0.50    Years: 4.00  Pack years: 2.00    Types: Cigarettes  . Smokeless tobacco: Never Used  Vaping Use  . Vaping Use: Never used  Substance Use Topics  . Alcohol use: Yes    Comment: occasional, last drank 05/01/20  . Drug use: Yes    Types: Marijuana    Comment: PT DENIES THIS DURING PREOP INTERVIEW ON 6-1 BUT IN DR PATELS H&P IT STATES HE DOES MARIJUANA    Review of Systems Patient denies headaches, rhinorrhea, blurry vision, numbness, shortness of breath, chest pain, edema, cough, abdominal pain, nausea, vomiting, diarrhea, dysuria, fevers, rashes or  hallucinations unless otherwise stated above in HPI. ____________________________________________   PHYSICAL EXAM:  VITAL SIGNS: Vitals:   05/04/20 1207  BP: (!) 157/102  Pulse: 59  Resp: (!) 33  Temp: 99.2 F (37.3 C)  SpO2: 100%    Constitutional: Alert and oriented.  Eyes: Conjunctivae are normal.  Head: Atraumatic. Nose: No congestion/rhinnorhea. Mouth/Throat: Mucous membranes are moist.   Neck: No stridor. Painless ROM.  Cardiovascular: Normal rate, regular rhythm. Grossly normal heart sounds.  Good peripheral circulation. Respiratory: Normal respiratory effort.  No retractions. Lungs CTAB. Gastrointestinal: Soft and nontender. No distention. No abdominal bruits. No CVA tenderness. Genitourinary:  Musculoskeletal: No lower extremity tenderness nor edema.  No joint effusions. Neurologic:  Normal speech and language. No gross focal neurologic deficits are appreciated. No facial droop Skin:  Skin is warm, dry and intact. No rash noted. Psychiatric: Mood and affect are normal. Speech and behavior are normal.  ____________________________________________   LABS (all labs ordered are listed, but only abnormal results are displayed)  Results for orders placed or performed during the hospital encounter of 05/04/20 (from the past 24 hour(s))  Lipase, blood     Status: None   Collection Time: 05/04/20 12:22 PM  Result Value Ref Range   Lipase 33 11 - 51 U/L  Comprehensive metabolic panel     Status: Abnormal   Collection Time: 05/04/20 12:22 PM  Result Value Ref Range   Sodium 137 135 - 145 mmol/L   Potassium 4.0 3.5 - 5.1 mmol/L   Chloride 101 98 - 111 mmol/L   CO2 20 (L) 22 - 32 mmol/L   Glucose, Bld 310 (H) 70 - 99 mg/dL   BUN 13 6 - 20 mg/dL   Creatinine, Ser 1.07 0.61 - 1.24 mg/dL   Calcium 9.5 8.9 - 10.3 mg/dL   Total Protein 7.1 6.5 - 8.1 g/dL   Albumin 4.4 3.5 - 5.0 g/dL   AST 68 (H) 15 - 41 U/L   ALT 52 (H) 0 - 44 U/L   Alkaline Phosphatase 90 38 - 126 U/L    Total Bilirubin 1.5 (H) 0.3 - 1.2 mg/dL   GFR calc non Af Amer >60 >60 mL/min   GFR calc Af Amer >60 >60 mL/min   Anion gap 16 (H) 5 - 15  CBC     Status: None   Collection Time: 05/04/20 12:22 PM  Result Value Ref Range   WBC 5.5 4.0 - 10.5 K/uL   RBC 4.82 4.22 - 5.81 MIL/uL   Hemoglobin 14.2 13.0 - 17.0 g/dL   HCT 41.7 39 - 52 %   MCV 86.5 80.0 - 100.0 fL   MCH 29.5 26.0 - 34.0 pg   MCHC 34.1 30.0 - 36.0 g/dL   RDW 13.5 11.5 - 15.5 %   Platelets 191 150 - 400 K/uL   nRBC 0.0 0.0 - 0.2 %  Blood gas, venous  Status: Abnormal   Collection Time: 05/04/20 12:33 PM  Result Value Ref Range   pH, Ven 7.54 (H) 7.25 - 7.43   pCO2, Ven 25 (L) 44 - 60 mmHg   pO2, Ven 55.0 (H) 32 - 45 mmHg   Bicarbonate 21.4 20.0 - 28.0 mmol/L   Acid-Base Excess 0.5 0.0 - 2.0 mmol/L   O2 Saturation 92.0 %   Patient temperature 37.0    Collection site VENOUS    Sample type VENOUS    ____________________________________________  EKG My review and personal interpretation at Time: 12:19   Indication: nausea  Rate: 70  Rhythm: sinus Axis: normal Other: normal intervals, no stemi ____________________________________________  RADIOLOGY   ____________________________________________   PROCEDURES  Procedure(s) performed:  Procedures    Critical Care performed: no ____________________________________________   INITIAL IMPRESSION / ASSESSMENT AND PLAN / ED COURSE  Pertinent labs & imaging results that were available during my care of the patient were reviewed by me and considered in my medical decision making (see chart for details).   DDX: DKA, HHS, gastritis, enteritis, cyclic vomiting syndrome, pancreatitis  Wesley [name] is a 24 y.o. who presents to the ED with symptoms as described above. Patient clinically well-appearing in no acute distress but patient with evidence of hyperglycemia. Does have borderline elevation of his anion gap but his VBG is nonacidemic. Have recommended IV  fluids and insulin for treatment of his hyperglycemia as well as IV antiemetics.  /prmon   Clinical Course as of May 04 1826  Fri May 04, 2020  1642 Went to reassess and check on the patient but it appears that he is eloped prior to receiving IV fluids and IV insulin.  Have made attempts to reach patient but have been unsuccessful.   [PR]    Clinical Course User Index [PR] Merlyn Lot, MD    The patient was evaluated in Emergency Department today for the symptoms described in the history of present illness. He/she was evaluated in the context of the global COVID-19 pandemic, which necessitated consideration that the patient might be at risk for infection with the SARS-CoV-2 virus that causes COVID-19. Institutional protocols and algorithms that pertain to the evaluation of patients at risk for COVID-19 are in a state of rapid change based on information released by regulatory bodies including the CDC and federal and state organizations. These policies and algorithms were followed during the patient's care in the ED.  As part of my medical decision making, I reviewed the following data within the [place] notes reviewed and incorporated, Labs reviewed, notes from prior ED visits and [place] Controlled Substance Database   ____________________________________________   FINAL CLINICAL IMPRESSION(S) / ED DIAGNOSES  Final diagnoses:  Nausea and vomiting, intractability of vomiting not specified, unspecified vomiting type  Hyperglycemia      NEW MEDICATIONS STARTED DURING THIS VISIT:  Discharge Medication List as of 05/04/2020  5:08 PM       Note:  This document was prepared using Dragon voice recognition software and may include unintentional dictation errors.    Merlyn Lot, MD 05/04/20 [phone]

## 2020-05-07 LAB — GLUCOSE, CAPILLARY: Glucose-Capillary: 279 mg/dL — ABNORMAL HIGH (ref 70–99)

## 2021-06-16 ENCOUNTER — Other Ambulatory Visit: Payer: Self-pay

## 2021-06-16 ENCOUNTER — Inpatient Hospital Stay
Admission: EM | Admit: 2021-06-16 | Discharge: 2021-06-19 | DRG: 639 | Disposition: A | Discharge status: Home / Self Care | Payer: BC Managed Care – PPO | Attending: Internal Medicine | Admitting: Internal Medicine

## 2021-06-16 DIAGNOSIS — Z794 Long term (current) use of insulin: Secondary | ICD-10-CM

## 2021-06-16 DIAGNOSIS — Z20822 Contact with and (suspected) exposure to covid-19: Secondary | ICD-10-CM | POA: Diagnosis present

## 2021-06-16 DIAGNOSIS — E1010 Type 1 diabetes mellitus with ketoacidosis without coma: Secondary | ICD-10-CM | POA: Diagnosis not present

## 2021-06-16 DIAGNOSIS — E876 Hypokalemia: Secondary | ICD-10-CM

## 2021-06-16 DIAGNOSIS — Z8249 Family history of ischemic heart disease and other diseases of the circulatory system: Secondary | ICD-10-CM | POA: Diagnosis not present

## 2021-06-16 DIAGNOSIS — Z87891 Personal history of nicotine dependence: Secondary | ICD-10-CM | POA: Diagnosis not present

## 2021-06-16 DIAGNOSIS — Z833 Family history of diabetes mellitus: Secondary | ICD-10-CM

## 2021-06-16 DIAGNOSIS — E1110 Type 2 diabetes mellitus with ketoacidosis without coma: Secondary | ICD-10-CM | POA: Diagnosis present

## 2021-06-16 LAB — BLOOD GAS, VENOUS
Acid-base deficit: 17.4 mmol/L — ABNORMAL HIGH (ref 0.0–2.0)
Acid-base deficit: 9.3 mmol/L — ABNORMAL HIGH (ref 0.0–2.0)
Bicarbonate: 9.5 mmol/L — ABNORMAL LOW (ref 20.0–28.0)
Bicarbonate: 15.6 mmol/L — ABNORMAL LOW (ref 20.0–28.0)
O2 Saturation: 86.6 %
O2 Saturation: 93.4 %
Patient temperature: 37
Patient temperature: 37
pCO2, Ven: 26 mmHg — ABNORMAL LOW (ref 44.0–60.0)
pCO2, Ven: 31 mmHg — ABNORMAL LOW (ref 44.0–60.0)
pH, Ven: 7.17 — CL (ref 7.250–7.430)
pH, Ven: 7.31 (ref 7.250–7.430)
pO2, Ven: 67 mmHg — ABNORMAL HIGH (ref 32.0–45.0)
pO2, Ven: 75 mmHg — ABNORMAL HIGH (ref 32.0–45.0)

## 2021-06-16 LAB — URINALYSIS, COMPLETE (UACMP) WITH MICROSCOPIC
Bacteria, UA: NONE SEEN
Bilirubin Urine: NEGATIVE
Glucose, UA: 500 mg/dL — AB
Hgb urine dipstick: NEGATIVE
Ketones, ur: >160 mg/dL — AB
Leukocytes,Ua: NEGATIVE
Nitrite: NEGATIVE
Protein, ur: NEGATIVE mg/dL
Specific Gravity, Urine: 1.015 (ref 1.005–1.030)
pH: 5 (ref 5.0–8.0)

## 2021-06-16 LAB — BASIC METABOLIC PANEL
Anion gap: 21 — ABNORMAL HIGH (ref 5–15)
Anion gap: 22 — ABNORMAL HIGH (ref 5–15)
Anion gap: 14 (ref 5–15)
BUN: 23 mg/dL — ABNORMAL HIGH (ref 6–20)
BUN: 22 mg/dL — ABNORMAL HIGH (ref 6–20)
BUN: 20 mg/dL (ref 6–20)
CO2: 13 mmol/L — ABNORMAL LOW (ref 22–32)
CO2: 9 mmol/L — ABNORMAL LOW (ref 22–32)
CO2: 14 mmol/L — ABNORMAL LOW (ref 22–32)
Calcium: 9.1 mg/dL (ref 8.9–10.3)
Calcium: 8.7 mg/dL — ABNORMAL LOW (ref 8.9–10.3)
Calcium: 9.1 mg/dL (ref 8.9–10.3)
Chloride: 99 mmol/L (ref 98–111)
Chloride: 102 mmol/L (ref 98–111)
Chloride: 110 mmol/L (ref 98–111)
Creatinine, Ser: 1.35 mg/dL — ABNORMAL HIGH (ref 0.61–1.24)
Creatinine, Ser: 1.35 mg/dL — ABNORMAL HIGH (ref 0.61–1.24)
Creatinine, Ser: 1.09 mg/dL (ref 0.61–1.24)
GFR, Estimated: >60 mL/min (ref >60)
GFR, Estimated: >60 mL/min (ref >60)
GFR, Estimated: >60 mL/min (ref >60)
Glucose, Bld: 574 mg/dL (ref 70–99)
Glucose, Bld: 568 mg/dL (ref 70–99)
Glucose, Bld: 214 mg/dL — ABNORMAL HIGH (ref 70–99)
Potassium: 5.8 mmol/L — ABNORMAL HIGH (ref 3.5–5.1)
Potassium: 6.7 mmol/L (ref 3.5–5.1)
Potassium: 4.7 mmol/L (ref 3.5–5.1)
Sodium: 133 mmol/L — ABNORMAL LOW (ref 135–145)
Sodium: 133 mmol/L — ABNORMAL LOW (ref 135–145)
Sodium: 138 mmol/L (ref 135–145)

## 2021-06-16 LAB — CBC
HCT: 47.8 % (ref 39.0–52.0)
Hemoglobin: 16.3 g/dL (ref 13.0–17.0)
MCH: 31 pg (ref 26.0–34.0)
MCHC: 34.1 g/dL (ref 30.0–36.0)
MCV: 91 fL (ref 80.0–100.0)
Platelets: 261 10*3/uL (ref 150–400)
RBC: 5.25 MIL/uL (ref 4.22–5.81)
RDW: 12.7 % (ref 11.5–15.5)
WBC: 8.6 10*3/uL (ref 4.0–10.5)
nRBC: 0 % (ref 0.0–0.2)

## 2021-06-16 LAB — BETA-HYDROXYBUTYRIC ACID: Beta-Hydroxybutyric Acid: >8 mmol/L — ABNORMAL HIGH (ref 0.05–0.27)

## 2021-06-16 LAB — CBG MONITORING, ED
Glucose-Capillary: 558 mg/dL (ref 70–99)
Glucose-Capillary: 448 mg/dL — ABNORMAL HIGH (ref 70–99)
Glucose-Capillary: 569 mg/dL (ref 70–99)
Glucose-Capillary: 380 mg/dL — ABNORMAL HIGH (ref 70–99)
Glucose-Capillary: 294 mg/dL — ABNORMAL HIGH (ref 70–99)
Glucose-Capillary: 242 mg/dL — ABNORMAL HIGH (ref 70–99)
Glucose-Capillary: 245 mg/dL — ABNORMAL HIGH (ref 70–99)
Glucose-Capillary: 208 mg/dL — ABNORMAL HIGH (ref 70–99)
Glucose-Capillary: 226 mg/dL — ABNORMAL HIGH (ref 70–99)
Glucose-Capillary: 189 mg/dL — ABNORMAL HIGH (ref 70–99)
Glucose-Capillary: 210 mg/dL — ABNORMAL HIGH (ref 70–99)

## 2021-06-16 LAB — RESP PANEL BY RT-PCR (FLU A&B, COVID) ARPGX2
Influenza A by PCR: NEGATIVE
Influenza B by PCR: NEGATIVE
SARS Coronavirus 2 by RT PCR: NEGATIVE

## 2021-06-16 LAB — PROCALCITONIN: Procalcitonin: <0.1 ng/mL

## 2021-06-16 MED ORDER — CALCIUM GLUCONATE-NACL 1-0.675 GM/50ML-% IV SOLN
1.0000 g | Freq: Once | INTRAVENOUS | Status: AC
  Administered 2021-06-16: 1000 mg via INTRAVENOUS
  Filled 2021-06-16: qty 50

## 2021-06-16 MED ORDER — INSULIN REGULAR(HUMAN) IN NACL 100-0.9 UT/100ML-% IV SOLN
INTRAVENOUS | Status: DC
  Administered 2021-06-16: 9.5 [IU]/h via INTRAVENOUS
  Filled 2021-06-16: qty 100

## 2021-06-16 MED ORDER — DEXTROSE IN LACTATED RINGERS 5 % IV SOLN: INTRAVENOUS | Status: DC

## 2021-06-16 MED ORDER — SODIUM CHLORIDE 0.9 % IV BOLUS
1000.0000 mL | Freq: Once | INTRAVENOUS | Status: AC
  Administered 2021-06-16: 1000 mL via INTRAVENOUS

## 2021-06-16 MED ORDER — ENOXAPARIN SODIUM 40 MG/0.4ML IJ SOSY
40.0000 mg | PREFILLED_SYRINGE | INTRAMUSCULAR | Status: DC
  Administered 2021-06-16 – 2021-06-18 (×3): 40 mg via SUBCUTANEOUS
  Filled 2021-06-16 (×3): qty 0.4

## 2021-06-16 MED ORDER — INSULIN REGULAR(HUMAN) IN NACL 100-0.9 UT/100ML-% IV SOLN
INTRAVENOUS | Status: DC
  Administered 2021-06-17: 2.6 [IU]/h via INTRAVENOUS
  Filled 2021-06-16: qty 100

## 2021-06-16 MED ORDER — ALBUTEROL SULFATE (2.5 MG/3ML) 0.083% IN NEBU
2.5000 mg | INHALATION_SOLUTION | Freq: Once | RESPIRATORY_TRACT | Status: AC
  Administered 2021-06-16: 2.5 mg via RESPIRATORY_TRACT
  Filled 2021-06-16: qty 3

## 2021-06-16 MED ORDER — ONDANSETRON HCL 4 MG/2ML IJ SOLN
4.0000 mg | Freq: Once | INTRAMUSCULAR | Status: AC
  Administered 2021-06-16: 4 mg via INTRAVENOUS
  Filled 2021-06-16: qty 2

## 2021-06-16 MED ORDER — LACTATED RINGERS IV SOLN: INTRAVENOUS | Status: DC

## 2021-06-16 MED ORDER — SODIUM CHLORIDE 0.9 % IV SOLN
12.5000 mg | Freq: Once | INTRAVENOUS | Status: AC
  Administered 2021-06-16: 12.5 mg via INTRAVENOUS
  Filled 2021-06-16: qty 12.5

## 2021-06-16 MED ORDER — ONDANSETRON HCL 4 MG/2ML IJ SOLN
4.0000 mg | Freq: Four times a day (QID) | INTRAMUSCULAR | Status: DC | PRN
  Administered 2021-06-17 – 2021-06-18 (×5): 4 mg via INTRAVENOUS
  Filled 2021-06-16 (×5): qty 2

## 2021-06-16 MED ORDER — LACTATED RINGERS IV BOLUS: 20.0000 mL/kg | Freq: Once | INTRAVENOUS | Status: DC

## 2021-06-16 MED ORDER — INSULIN ASPART 100 UNIT/ML IV SOLN
10.0000 [IU] | Freq: Once | INTRAVENOUS | Status: DC
  Filled 2021-06-16: qty 0.1

## 2021-06-16 MED ORDER — ENOXAPARIN SODIUM 30 MG/0.3ML IJ SOSY: 30.0000 mg | PREFILLED_SYRINGE | INTRAMUSCULAR | Status: DC

## 2021-06-16 MED ORDER — DEXTROSE 50 % IV SOLN: 0.0000 mL | INTRAVENOUS | Status: DC | PRN

## 2021-06-16 MED ORDER — DEXTROSE-NACL 5-0.45 % IV SOLN: INTRAVENOUS | Status: DC

## 2021-06-16 MED ORDER — SODIUM CHLORIDE 0.9 % IV SOLN: INTRAVENOUS | Status: DC

## 2021-06-16 NOTE — H&P (Signed)
History and Physical    Wesley [name] ION:[id number] DOB: 21-Jul-1996 DOA: 06/16/2021  PCP: [facility] [place]  Patient coming from: home  I have personally briefly reviewed patient's old medical records in [place]  Chief Complaint:  uncontrolled sugars at home , vague feeling unwell low appetite   HPI: Wesley [name] is a 25 y.o. male with medical history significant of  DM Type 1 since age of 65 , who has has had 2 days of elevated sugars at home and vague feeling of being unwell and low appetite and fatigue. He also noted increase thirst. Despite being complaint with medications. He notes cough , sob, chest pain, abdominal pain , n/v/ diarrhea or dysuria.  In ed on evaluation patient found to have acute DKA.   ED Course:  Afeb, bp 138/70, hr 112, rr 16 sat 99%  Wbc 8.6, hgb 16.3, plt 261  UA+ketones >160  Na:133, K 5.8, co2 13,glu 574, cr 1.35 base 1.07 +betahydroxy>8 Tx: dka protocol , anti-emetic,ivfs Review of Systems: As per HPI otherwise 10 point review of systems negative.   Past Medical History:  Diagnosis Date   Diabetes mellitus without complication ([place])    type 1 diabetes    Past Surgical History:  Procedure Laterality Date   HAND SURGERY     INCISION AND DRAINAGE ABSCESS N/A 10/02/2015   Procedure: INCISION AND DRAINAGE ABSCESS;  Surgeon: Festus Aloe, MD;  Location: [facility] ORS;  Service: Urology;  Laterality: N/A;   INCISION AND DRAINAGE ABSCESS N/A 07/18/2019   Procedure: INCISION AND DRAINAGE SCROTAL ABSCESS;  Surgeon: Abbie Sons, MD;  Location: [facility] ORS;  Service: Urology;  Laterality: N/A;   KNEE ARTHROSCOPY WITH MEDIAL MENISECTOMY Right 03/17/2019   Procedure: KNEE ARTHROSCOPY WITH MEDIAL MENISECTOMY, POSSIBLE CHONDROPLASTY RIGHT - DIABETIC;  Surgeon: Leim Fabry, MD;  Location: [facility] ORS;  Service: Orthopedics;  Laterality: Right;   TONSILLECTOMY     TONSILLECTOMY AND ADENOIDECTOMY     UMBILICAL HERNIA REPAIR        reports that he has quit smoking. His smoking use included cigarettes. He has a 2.00 pack-year smoking history. He has never used smokeless tobacco. He reports current alcohol use. He reports current drug use. Drug: Marijuana.  No Known Allergies  Family History  Problem Relation Age of Onset   Diabetes Mellitus II Paternal Grandfather    Hypertension Mother    Prior to Admission medications   Medication Sig Start Date End Date Taking? Authorizing Provider  blood glucose meter kit and supplies KIT Dispense based on patient and insurance preference. Use up to four times daily as directed. (FOR ICD-9 250.00, 250.01). 01/05/20   Ralene Muskrat B, MD  glucose blood test strip Use as instructed 08/24/19   Kayleen Memos, DO  insulin NPH-regular Human (70-30) 100 UNIT/ML injection Inject 23 Units into the skin 2 (two) times daily with a meal. 01/05/20   Sreenath, Sudheer B, MD  insulin regular (NOVOLIN R) 100 units/mL injection Inject 0.01-0.1 mLs (1-10 Units total) into the skin 3 (three) times daily before meals. Per sliding scale 08/24/19   Kayleen Memos, DO  Insulin Syringes, Disposable, U-100 0.5 ML MISC 40 Units by Does not apply route 3 (three) times daily. 08/24/19   Kayleen Memos, DO  Lancets [facility] ULTRASOFT) lancets Please use as prescribed 08/24/19   Kayleen Memos, [place]    Physical Exam: Vitals:   06/16/21 1306 06/16/21 1330 06/16/21 1400 06/16/21 1430  BP: 126/77 119/71 130/70  119/62  Pulse: (!) 102 95 95 (!) 102  Resp: 20 (!) _0 Temp:      TempSrc:      SpO2: 99% 99% 100% 100%  Weight:      Height:        Constitutional: NAD, calm, comfortable,lethargic but easily awakens Vitals:   06/16/21 1306 06/16/21 1330 06/16/21 1400 06/16/21 1430  BP: 126/77 119/71 130/70 119/62  Pulse: (!) 102 95 95 (!) 102  Resp: 20 (!) _1 Temp:      TempSrc:      SpO2: 99% 99% 100% 100%  Weight:      Height:       Eyes: PERRL, lids and conjunctivae normal ENMT: Mucous  membranes are moist. Posterior pharynx clear of any exudate or lesions.Normal dentition.  Neck: normal, supple, no masses, no thyromegaly Respiratory: clear to auscultation bilaterally, no wheezing, no crackles. Normal respiratory effort. No accessory muscle use.  Cardiovascular: Regular rate and rhythm, no murmurs / rubs / gallops. No extremity edema. 2+ pedal pulses.  Abdomen: no tenderness, no masses palpated. No hepatosplenomegaly. Bowel sounds positive.  Musculoskeletal: no clubbing / cyanosis. No joint deformity upper and lower extremities. Good ROM, no contractures. Normal muscle tone.  Skin: no rashes, lesions, ulcers. No induration Neurologic: CN 2-12 grossly intact. Sensation intact, Strength 5/5 in all 4.  Psychiatric: Normal judgment and insight. Alert and oriented x 3. Normal mood.    Labs on Admission: I have personally reviewed following labs and imaging studies  CBC: Recent Labs  Lab 06/16/21 1237  WBC 8.6  HGB 16.3  HCT 47.8  MCV 91.0  PLT 683   Basic Metabolic Panel: Recent Labs  Lab 06/16/21 1237  NA 133*  K 5.8*  CL 99  CO2 13*  GLUCOSE 574*  BUN 23*  CREATININE 1.35*  CALCIUM 9.1   GFR: Estimated Creatinine Clearance: 84.4 mL/min (A) (by C-G formula based on SCr of 1.35 mg/dL (H)). Liver Function Tests: No results for input(s): AST, ALT, ALKPHOS, BILITOT, PROT, ALBUMIN in the last 168 hours. No results for input(s): LIPASE, AMYLASE in the last 168 hours. No results for input(s): AMMONIA in the last 168 hours. Coagulation Profile: No results for input(s): INR, PROTIME in the last 168 hours. Cardiac Enzymes: No results for input(s): CKTOTAL, CKMB, CKMBINDEX, TROPONINI in the last 168 hours. BNP (last 3 results) No results for input(s): PROBNP in the last 8760 hours. HbA1C: No results for input(s): HGBA1C in the last 72 hours. CBG: Recent Labs  Lab 06/16/21 1241 06/16/21 1438  GLUCAP 558* 569*   Lipid Profile: No results for input(s): CHOL,  HDL, LDLCALC, TRIG, CHOLHDL, LDLDIRECT in the last 72 hours. Thyroid Function Tests: No results for input(s): TSH, T4TOTAL, FREET4, T3FREE, THYROIDAB in the last 72 hours. Anemia Panel: No results for input(s): VITAMINB12, FOLATE, FERRITIN, TIBC, IRON, RETICCTPCT in the last 72 hours. Urine analysis:    Component Value Date/Time   COLORURINE YELLOW 06/16/2021 [place] 06/16/2021 1237   APPEARANCEUR Clear 12/24/2011 1505   LABSPEC 1.015 06/16/2021 1237   LABSPEC 1.030 12/24/2011 1505   PHURINE 5.0 06/16/2021 1237   GLUCOSEU 500 (A) 06/16/2021 1237   GLUCOSEU >=500 12/24/2011 1505   HGBUR NEGATIVE 06/16/2021 [place] 06/16/2021 1237   BILIRUBINUR Negative 12/24/2011 1505   KETONESUR >160 (A) 06/16/2021 1237   PROTEINUR NEGATIVE 06/16/2021 1237   NITRITE NEGATIVE 06/16/2021 [place] 06/16/2021 1237  LEUKOCYTESUR Negative 12/24/2011 1505    Radiological Exams on Admission: No results found.  EKG: Independently reviewed. Unavailable  Assessment/Plan DKA -place on DKA protocol  -regular insulin drip/ ivfs/ q1h-44mn fs , q4h  bmps  -no signs of infection , ua negative cxr  pending  ,respiratory panel negative    DVT prophylaxis:  heparin Code Status: full Family Communication: n/a Disposition Plan: patient  expected to be admitted greater than 2 midnights  Consults called: n/a Admission status: sdu   SClance BollMD [facility]  If 7PM-7AM, please contact night-coverage [email or web address] Password [email or web address]  06/16/2021, 3:08 PM

## 2021-06-16 NOTE — ED Notes (Signed)
Admitting paged regarding orders for LR boluses and LR drip with patients elevated potassium. MD gave verbal order to DC fluids, administer NS continuously at 125 ml/hr. MD verbal for next BMP at 1900 then Q4. Orders changed. Lab called to come get labs at 1900.

## 2021-06-16 NOTE — ED Provider Notes (Signed)
[facility] Emergency Department Provider Note ____________________________________________   Event Date/Time   First MD Initiated Contact with Patient 06/16/21 1259     (approximate)  I have reviewed the triage vital signs and the nursing notes.   HISTORY  Chief Complaint Hyperglycemia  HPI Wesley [name] is a 25 y.o. male with history of type 1 DM , DKA, and remaining history as listed below presents to the emergency department for treatment and evaluation of hyperglycemia for the past 2 days.  He states that he has not eaten because he has not had any appetite.  Has had no vomiting or diarrhea.  He has had increased thirst, no abdominal pain, cough, fever, or other concerning symptoms. He has continued his insulin.       Past Medical History:  Diagnosis Date   Diabetes mellitus without complication ([place])    type 1 diabetes    Patient Active Problem List   Diagnosis Date Noted   Perineal abscess 01/03/2020   Hyperglycemia due to type 1 diabetes mellitus ([place]) 01/03/2020   Lactic acidosis 01/03/2020   DKA, type 1 ([place]) 08/23/2019   Cellulitis of scrotum 07/18/2019   Acute renal failure ([place])    Tobacco use disorder 02/10/2018   DKA (diabetic ketoacidoses) 09/01/2016   Sepsis ([place]) 10/02/2015   Scrotal abscess    Diabetes mellitus with ketoacidosis ([place]) 08/13/2012   Type 1 diabetes mellitus ([place]) 02/06/2012    Past Surgical History:  Procedure Laterality Date   HAND SURGERY     INCISION AND DRAINAGE ABSCESS N/A 10/02/2015   Procedure: INCISION AND DRAINAGE ABSCESS;  Surgeon: Festus Aloe, MD;  Location: [facility] ORS;  Service: Urology;  Laterality: N/A;   INCISION AND DRAINAGE ABSCESS N/A 07/18/2019   Procedure: INCISION AND DRAINAGE SCROTAL ABSCESS;  Surgeon: Abbie Sons, MD;  Location: [facility] ORS;  Service: Urology;  Laterality: N/A;   KNEE ARTHROSCOPY WITH MEDIAL MENISECTOMY Right 03/17/2019   Procedure: KNEE ARTHROSCOPY WITH MEDIAL  MENISECTOMY, POSSIBLE CHONDROPLASTY RIGHT - DIABETIC;  Surgeon: Leim Fabry, MD;  Location: [facility] ORS;  Service: Orthopedics;  Laterality: Right;   TONSILLECTOMY     TONSILLECTOMY AND ADENOIDECTOMY     UMBILICAL HERNIA REPAIR      Prior to Admission medications   Medication Sig Start Date End Date Taking? Authorizing Provider  blood glucose meter kit and supplies KIT Dispense based on patient and insurance preference. Use up to four times daily as directed. (FOR ICD-9 250.00, 250.01). 01/05/20   Ralene Muskrat B, MD  glucose blood test strip Use as instructed 08/24/19   Kayleen Memos, DO  insulin NPH-regular Human (70-30) 100 UNIT/ML injection Inject 23 Units into the skin 2 (two) times daily with a meal. 01/05/20   Sreenath, Sudheer B, MD  insulin regular (NOVOLIN R) 100 units/mL injection Inject 0.01-0.1 mLs (1-10 Units total) into the skin 3 (three) times daily before meals. Per sliding scale 08/24/19   Kayleen Memos, DO  Insulin Syringes, Disposable, U-100 0.5 ML MISC 40 Units by Does not apply route 3 (three) times daily. 08/24/19   Kayleen Memos, DO  Lancets [facility] ULTRASOFT) lancets Please use as prescribed 08/24/19   Kayleen Memos, DO    Allergies Patient has no known allergies.  Family History  Problem Relation Age of Onset   Diabetes Mellitus II Paternal Grandfather    Hypertension Mother     Social History Social History   Tobacco Use   Smoking status: Former  Packs/day: 0.50    Years: 4.00    Pack years: 2.00    Types: Cigarettes   Smokeless tobacco: Never  Vaping Use   Vaping Use: Never used  Substance Use Topics   Alcohol use: Yes    Comment: occasional, last drank 05/01/20   Drug use: Yes    Types: Marijuana    Comment: PT DENIES THIS DURING PREOP INTERVIEW ON 6-1 BUT IN DR PATELS H&P IT STATES HE DOES MARIJUANA    Review of Systems  Constitutional: No fever/chills Eyes: No visual changes. ENT: No sore throat. Cardiovascular: Denies chest  pain. Respiratory: Denies shortness of breath. Gastrointestinal: No abdominal pain.  No nausea, no vomiting.  No diarrhea.  Genitourinary: Negative for dysuria. Musculoskeletal: Negative for back pain. Skin: Negative for rash. Neurological: Negative for headaches, focal weakness or numbness. ____________________________________________   PHYSICAL EXAM:  VITAL SIGNS: ED Triage Vitals  Enc Vitals Group     BP 06/16/21 1232 138/70     Pulse Rate 06/16/21 1232 (!) 112     Resp 06/16/21 1232 16     Temp 06/16/21 1232 98.1 F (36.7 C)     Temp Source 06/16/21 1232 Oral     SpO2 06/16/21 1232 99 %     Weight 06/16/21 1233 175 lb (79.4 kg)     Height 06/16/21 1233 _0  (1.753 m)     Head Circumference --      Peak Flow --      Pain Score 06/16/21 1233 3     Pain Loc --      Pain Edu? --      Excl. in [place]? --     Constitutional: Alert and oriented. Well appearing and in no acute distress. Eyes: Conjunctivae are normal.  Head: Atraumatic. Nose: No congestion/rhinnorhea. Mouth/Throat: Mucous membranes are moist.  Oropharynx non-erythematous. Neck: No stridor.   Hematological/Lymphatic/Immunilogical: No cervical lymphadenopathy. Cardiovascular: Normal rate, regular rhythm. Grossly normal heart sounds.  Good peripheral circulation. Respiratory: Normal respiratory effort. No retractions. Lungs CTAB. Gastrointestinal: Soft and nontender. No distention. No abdominal bruits. Genitourinary:  Musculoskeletal: No lower extremity tenderness nor edema.  No joint effusions. Neurologic:  Normal speech and language. No gross focal neurologic deficits are appreciated. No gait instability. Skin:  Skin is warm, dry and intact. No rash noted. Psychiatric: Mood and affect are normal. Speech and behavior are normal.  ____________________________________________   LABS (all labs ordered are listed, but only abnormal results are displayed)  Labs Reviewed  BASIC METABOLIC PANEL - Abnormal; Notable  for the following components:      Result Value   Sodium 133 (*)    Potassium 5.8 (*)    CO2 13 (*)    Glucose, Bld 574 (*)    BUN 23 (*)    Creatinine, Ser 1.35 (*)    Anion gap 21 (*)    All other components within normal limits  URINALYSIS, COMPLETE (UACMP) WITH MICROSCOPIC - Abnormal; Notable for the following components:   Glucose, UA 500 (*)    Ketones, ur >160 (*)    All other components within normal limits  BETA-HYDROXYBUTYRIC ACID - Abnormal; Notable for the following components:   Beta-Hydroxybutyric Acid >8.00 (*)    All other components within normal limits  BLOOD GAS, VENOUS - Abnormal; Notable for the following components:   pH, Ven 7.17 (*)    pCO2, Ven 26 (*)    pO2, Ven 67.0 (*)    Bicarbonate 9.5 (*)    Acid-base deficit  17.4 (*)    All other components within normal limits  CBG MONITORING, ED - Abnormal; Notable for the following components:   Glucose-Capillary 558 (*)    All other components within normal limits  CBG MONITORING, ED - Abnormal; Notable for the following components:   Glucose-Capillary 569 (*)    All other components within normal limits  RESP PANEL BY RT-PCR (FLU A&B, COVID) ARPGX2  CBC  BASIC METABOLIC PANEL  CBG MONITORING, ED   ____________________________________________  EKG  ED ECG REPORT I, Marasia Newhall, FNP-BC personally viewed and interpreted this ECG.   Date: 06/16/2021  EKG Time: 1243  Rate: 110  Rhythm: sinus tachycardia  Axis: normal  Intervals:none  ST&T Change: No ST elevation  ____________________________________________  RADIOLOGY  ED MD interpretation:    Not indicated.  I, Sherrie George, personally viewed and evaluated these images (plain radiographs) as part of my medical decision making, as well as reviewing the written report by the radiologist.  Official radiology report(s): No results found.  ____________________________________________   PROCEDURES  Procedure(s) performed (including  Critical Care):  Procedures  ____________________________________________   INITIAL IMPRESSION / ASSESSMENT AND PLAN     25 year old male presenting to the emergency department for treatment and evaluation of hyperglycemia for the past couple of days.  See HPI for further details. Will get labs and evaluate for DKA.  DIFFERENTIAL DIAGNOSIS  Hyperglycemia, DKA, COVID-19  ED COURSE  Patient is acidotic with pH of 1.17.  Will start insulin drip in addition to fluids.  He is feeling very nauseated.  Zofran has been given earlier with temporary relief.  Phenergan ordered at this time. Also requested a repeat BMP as previous specimen appears hemolyzed.   Patient advised of diagnosis and plan for admission. Hospitalist consult requested.  ----------------------------------------- 3:01 PM on 06/16/2021 ----------------------------------------- Patient accepted for admission.    ___________________________________________   FINAL CLINICAL IMPRESSION(S) / ED DIAGNOSES  Final diagnoses:  Type 1 diabetes mellitus with ketoacidosis without coma [facility])     ED Discharge Orders     None        Wesley [name] was evaluated in Emergency Department on 06/16/2021 for the symptoms described in the history of present illness. He was evaluated in the context of the global COVID-19 pandemic, which necessitated consideration that the patient might be at risk for infection with the SARS-CoV-2 virus that causes COVID-19. Institutional protocols and algorithms that pertain to the evaluation of patients at risk for COVID-19 are in a state of rapid change based on information released by regulatory bodies including the CDC and federal and state organizations. These policies and algorithms were followed during the patient's care in the ED.   Note:  This document was prepared using Dragon voice recognition software and may include unintentional dictation errors.    Victorino Dike, FNP 06/16/21  1501    Vladimir Crofts, MD 06/16/21 1525

## 2021-06-16 NOTE — ED Triage Notes (Signed)
Pt arrives to ER via [facility] for dehydration x 1 day. Denies N&V&D. States type 1 DM, CBG HIGH at home but had come down with EMS. Pt recevied NS bolus which has completed in triage.

## 2021-06-16 NOTE — ED Triage Notes (Signed)
Pt comes ems from home for dehydration. T22D since 25 years old. CBG 481. Hasn't eaten in two days due to no appetite. Tachy at 105. Getting second bag of through 20G L AC.

## 2021-06-16 NOTE — Progress Notes (Signed)
PHARMACIST - PHYSICIAN COMMUNICATION  CONCERNING:  Enoxaparin (Lovenox) for DVT Prophylaxis    RECOMMENDATION: Patient was prescribed enoxaprin 30mg  q24 hours for VTE prophylaxis.   Filed Weights   06/16/21 1233  Weight: 79.4 kg (175 lb)    Body mass index is 25.84 kg/m.  Estimated Creatinine Clearance: 84.4 mL/min (A) (by C-G formula based on SCr of 1.35 mg/dL (H)).   Based on [facility] policy patient is candidate for enoxaparin 40mg  every 24 hours based on CrCl >29ml/min or Weight >45kg  DESCRIPTION: Pharmacy has adjusted enoxaparin dose per [facility] policy.  Patient is now receiving enoxaparin 40mg  every 24hours   31m, PharmD, BCPS Clinical Pharmacist 06/16/2021 4:40 PM

## 2021-06-17 LAB — BASIC METABOLIC PANEL
Anion gap: 10 (ref 5–15)
Anion gap: 9 (ref 5–15)
Anion gap: 7 (ref 5–15)
BUN: 16 mg/dL (ref 6–20)
BUN: 13 mg/dL (ref 6–20)
BUN: 10 mg/dL (ref 6–20)
CO2: 20 mmol/L — ABNORMAL LOW (ref 22–32)
CO2: 21 mmol/L — ABNORMAL LOW (ref 22–32)
CO2: 22 mmol/L (ref 22–32)
Calcium: 8.9 mg/dL (ref 8.9–10.3)
Calcium: 8.9 mg/dL (ref 8.9–10.3)
Calcium: 8.8 mg/dL — ABNORMAL LOW (ref 8.9–10.3)
Chloride: 108 mmol/L (ref 98–111)
Chloride: 108 mmol/L (ref 98–111)
Chloride: 109 mmol/L (ref 98–111)
Creatinine, Ser: 1.03 mg/dL (ref 0.61–1.24)
Creatinine, Ser: 0.92 mg/dL (ref 0.61–1.24)
Creatinine, Ser: 0.92 mg/dL (ref 0.61–1.24)
GFR, Estimated: >60 mL/min (ref >60)
GFR, Estimated: >60 mL/min (ref >60)
GFR, Estimated: >60 mL/min (ref >60)
Glucose, Bld: 208 mg/dL — ABNORMAL HIGH (ref 70–99)
Glucose, Bld: 179 mg/dL — ABNORMAL HIGH (ref 70–99)
Glucose, Bld: 114 mg/dL — ABNORMAL HIGH (ref 70–99)
Potassium: 4.8 mmol/L (ref 3.5–5.1)
Potassium: 4.1 mmol/L (ref 3.5–5.1)
Potassium: 3.6 mmol/L (ref 3.5–5.1)
Sodium: 138 mmol/L (ref 135–145)
Sodium: 138 mmol/L (ref 135–145)
Sodium: 138 mmol/L (ref 135–145)

## 2021-06-17 LAB — CBG MONITORING, ED
Glucose-Capillary: 119 mg/dL — ABNORMAL HIGH (ref 70–99)
Glucose-Capillary: 127 mg/dL — ABNORMAL HIGH (ref 70–99)
Glucose-Capillary: 144 mg/dL — ABNORMAL HIGH (ref 70–99)
Glucose-Capillary: 166 mg/dL — ABNORMAL HIGH (ref 70–99)
Glucose-Capillary: 173 mg/dL — ABNORMAL HIGH (ref 70–99)
Glucose-Capillary: 174 mg/dL — ABNORMAL HIGH (ref 70–99)
Glucose-Capillary: 183 mg/dL — ABNORMAL HIGH (ref 70–99)
Glucose-Capillary: 186 mg/dL — ABNORMAL HIGH (ref 70–99)
Glucose-Capillary: 187 mg/dL — ABNORMAL HIGH (ref 70–99)
Glucose-Capillary: 189 mg/dL — ABNORMAL HIGH (ref 70–99)
Glucose-Capillary: 193 mg/dL — ABNORMAL HIGH (ref 70–99)
Glucose-Capillary: 195 mg/dL — ABNORMAL HIGH (ref 70–99)
Glucose-Capillary: 196 mg/dL — ABNORMAL HIGH (ref 70–99)
Glucose-Capillary: 198 mg/dL — ABNORMAL HIGH (ref 70–99)
Glucose-Capillary: 203 mg/dL — ABNORMAL HIGH (ref 70–99)
Glucose-Capillary: 204 mg/dL — ABNORMAL HIGH (ref 70–99)
Glucose-Capillary: 210 mg/dL — ABNORMAL HIGH (ref 70–99)
Glucose-Capillary: 211 mg/dL — ABNORMAL HIGH (ref 70–99)
Glucose-Capillary: 250 mg/dL — ABNORMAL HIGH (ref 70–99)

## 2021-06-17 LAB — C-REACTIVE PROTEIN: CRP: <0.5 mg/dL (ref <1.0)

## 2021-06-17 LAB — HEMOGLOBIN A1C
Hgb A1c MFr Bld: 10.5 % — ABNORMAL HIGH (ref 4.8–5.6)
Mean Plasma Glucose: 254.65 mg/dL

## 2021-06-17 LAB — BETA-HYDROXYBUTYRIC ACID: Beta-Hydroxybutyric Acid: 0.53 mmol/L — ABNORMAL HIGH (ref 0.05–0.27)

## 2021-06-17 LAB — MAGNESIUM: Magnesium: 1.9 mg/dL (ref 1.7–2.4)

## 2021-06-17 LAB — HIV ANTIBODY (ROUTINE TESTING W REFLEX): HIV Screen 4th Generation wRfx: NONREACTIVE

## 2021-06-17 LAB — PROCALCITONIN: Procalcitonin: <0.1 ng/mL

## 2021-06-17 MED ORDER — LACTATED RINGERS IV SOLN: INTRAVENOUS | Status: DC

## 2021-06-17 MED ORDER — INSULIN DETEMIR 100 UNIT/ML ~~LOC~~ SOLN
2.0000 [IU] | Freq: Once | SUBCUTANEOUS | Status: AC
  Administered 2021-06-17: 2 [IU] via SUBCUTANEOUS
  Filled 2021-06-17: qty 0.02

## 2021-06-17 MED ORDER — PROCHLORPERAZINE EDISYLATE 10 MG/2ML IJ SOLN
10.0000 mg | Freq: Four times a day (QID) | INTRAMUSCULAR | Status: DC | PRN
  Administered 2021-06-18: 10 mg via INTRAVENOUS
  Filled 2021-06-17: qty 2

## 2021-06-17 MED ORDER — INSULIN DETEMIR 100 UNIT/ML ~~LOC~~ SOLN
10.0000 [IU] | Freq: Two times a day (BID) | SUBCUTANEOUS | Status: DC
  Administered 2021-06-17 – 2021-06-19 (×4): 10 [IU] via SUBCUTANEOUS
  Filled 2021-06-17 (×6): qty 0.1

## 2021-06-17 MED ORDER — INSULIN ASPART 100 UNIT/ML IJ SOLN
0.0000 [IU] | INTRAMUSCULAR | Status: DC
Start: 2021-06-17 — End: 2021-06-19
  Administered 2021-06-17: 2 [IU] via SUBCUTANEOUS
  Administered 2021-06-18: 3 [IU] via SUBCUTANEOUS
  Administered 2021-06-18: 2 [IU] via SUBCUTANEOUS
  Administered 2021-06-18: 3 [IU] via SUBCUTANEOUS
  Administered 2021-06-19: 2 [IU] via SUBCUTANEOUS
  Filled 2021-06-17 (×5): qty 1

## 2021-06-17 MED ORDER — INSULIN ASPART 100 UNIT/ML IJ SOLN: 5.0000 [IU] | Freq: Three times a day (TID) | INTRAMUSCULAR | Status: DC

## 2021-06-17 MED ORDER — BACITRACIN-NEOMYCIN-POLYMYXIN 400-5-5000 EX OINT
TOPICAL_OINTMENT | CUTANEOUS | Status: DC | PRN
  Filled 2021-06-17: qty 1

## 2021-06-17 NOTE — Progress Notes (Signed)
Was PROGRESS NOTE    Wesley [name]  YNW:[id number] DOB: June 24, 1996 DOA: 06/16/2021 PCP: [facility]    Brief Narrative:  Wesley [name] is a 25 y.o. male with medical history significant of  DM Type 1 since age of 46 , who has has had 2 days of elevated sugars at home and vague feeling of being unwell and low appetite and fatigue. He also noted increase thirst. Despite being complaint with medications. He notes cough , sob, chest pain, abdominal pain , n/v/ diarrhea or dysuria.  In ed on evaluation patient found to have acute DKA.  9/5 no further nausea or vomiting.  Noted on clears earlier tolerated.  Planning to advance diet.  Consultants:  Diabetic educator  Procedures:   Antimicrobials:      Subjective: No abd pain , n/v or any other symptoms  Objective: Vitals:   06/17/21 0400 06/17/21 0500 06/17/21 0600 06/17/21 0800  BP: (!) 130/92  138/88 131/83  Pulse: 89 100 (!) 115 92  Resp:      Temp:   98.4 F (36.9 C) 98.4 F (36.9 C)  TempSrc:   Oral   SpO2: 100% 100% 100% 99%  Weight:      Height:        Intake/Output Summary (Last 24 hours) at 06/17/2021 0913 Last data filed at 06/17/2021 0700 Gross per 24 hour  Intake 1730.85 ml  Output 500 ml  Net 1230.85 ml   Filed Weights   06/16/21 1233  Weight: 79.4 kg    Examination:  General exam: Appears calm and comfortable  Respiratory system: Clear to auscultation. Respiratory effort normal. Cardiovascular system: S1 & S2 heard, RRR. No JVD, murmurs, rubs, gallops or clicks. Gastrointestinal system: Abdomen is nondistended, soft and nontender. No organomegaly or masses felt. Normal bowel sounds heard. Central nervous system: Alert and oriented. No focal neurological deficits. Extremities: Warm dry Psychiatry: Judgement and insight appear normal. Mood & affect appropriate.     Data Reviewed: I have personally reviewed following labs and imaging studies  CBC: Recent Labs  Lab  06/16/21 1237  WBC 8.6  HGB 16.3  HCT 47.8  MCV 91.0  PLT 261   Basic Metabolic Panel: Recent Labs  Lab 06/16/21 1237 06/16/21 1441 06/16/21 2046 06/17/21 0058 06/17/21 0614  NA 133* 133* 138 138 138  K 5.8* 6.7* 4.7 4.8 4.1  CL 99 102 110 108 108  CO2 13* 9* 14* 20* 21*  GLUCOSE 574* 568* 214* 208* 179*  BUN 23* 22* 20 16 13   CREATININE 1.35* 1.35* 1.09 1.03 0.92  CALCIUM 9.1 8.7* 9.1 8.9 8.9  MG  --   --   --  1.9  --    GFR: Estimated Creatinine Clearance: 123.8 mL/min (by C-G formula based on SCr of 0.92 mg/dL). Liver Function Tests: No results for input(s): AST, ALT, ALKPHOS, BILITOT, PROT, ALBUMIN in the last 168 hours. No results for input(s): LIPASE, AMYLASE in the last 168 hours. No results for input(s): AMMONIA in the last 168 hours. Coagulation Profile: No results for input(s): INR, PROTIME in the last 168 hours. Cardiac Enzymes: No results for input(s): CKTOTAL, CKMB, CKMBINDEX, TROPONINI in the last 168 hours. BNP (last 3 results) No results for input(s): PROBNP in the last 8760 hours. HbA1C: No results for input(s): HGBA1C in the last 72 hours. CBG: Recent Labs  Lab 06/17/21 0503 06/17/21 0607 06/17/21 0717 06/17/21 0803 06/17/21 0902  GLUCAP 196* 174* 203* 204* 166*   Lipid Profile: No  results for input(s): CHOL, HDL, LDLCALC, TRIG, CHOLHDL, LDLDIRECT in the last 72 hours. Thyroid Function Tests: No results for input(s): TSH, T4TOTAL, FREET4, T3FREE, THYROIDAB in the last 72 hours. Anemia Panel: No results for input(s): VITAMINB12, FOLATE, FERRITIN, TIBC, IRON, RETICCTPCT in the last 72 hours. Sepsis Labs: Recent Labs  Lab 06/16/21 2046 06/17/21 0614  PROCALCITON <0.10 <0.10    Recent Results (from the past 240 hour(s))  Resp Panel by RT-PCR (Flu A&B, Covid) Nasopharyngeal Swab     Status: None   Collection Time: 06/16/21  2:06 PM   Specimen: Nasopharyngeal Swab; Nasopharyngeal(NP) swabs in vial transport medium  Result Value Ref  Range Status   SARS Coronavirus 2 by RT PCR NEGATIVE NEGATIVE Final    Comment: (NOTE) SARS-CoV-2 target nucleic acids are NOT DETECTED.  The SARS-CoV-2 RNA is generally detectable in upper respiratory specimens during the acute phase of infection. The lowest concentration of SARS-CoV-2 viral copies this assay can detect is 138 copies/mL. A negative result does not preclude SARS-Cov-2 infection and should not be used as the sole basis for treatment or other patient management decisions. A negative result may occur with  improper specimen collection/handling, submission of specimen other than nasopharyngeal swab, presence of viral mutation(s) within the areas targeted by this assay, and inadequate number of viral copies(<138 copies/mL). A negative result must be combined with clinical observations, patient history, and epidemiological information. The expected result is Negative.  Fact Sheet for Patients:  BloggerCourse.com  Fact Sheet for Healthcare Providers:  SeriousBroker.it  This test is no t yet approved or cleared by the [place] FDA and  has been authorized for detection and/or diagnosis of SARS-CoV-2 by FDA under an Emergency Use Authorization (EUA). This EUA will remain  in effect (meaning this test can be used) for the duration of the COVID-19 declaration under Section 564(b)(1) of the Act, 21 U.S.C.section 360bbb-3(b)(1), unless the authorization is terminated  or revoked sooner.       Influenza A by PCR NEGATIVE NEGATIVE Final   Influenza B by PCR NEGATIVE NEGATIVE Final    Comment: (NOTE) The Xpert Xpress SARS-CoV-2/FLU/RSV plus assay is intended as an aid in the diagnosis of influenza from Nasopharyngeal swab specimens and should not be used as a sole basis for treatment. Nasal washings and aspirates are unacceptable for Xpert Xpress SARS-CoV-2/FLU/RSV testing.  Fact Sheet for  Patients: BloggerCourse.com  Fact Sheet for Healthcare Providers: SeriousBroker.it  This test is not yet approved or cleared by the [place] FDA and has been authorized for detection and/or diagnosis of SARS-CoV-2 by FDA under an Emergency Use Authorization (EUA). This EUA will remain in effect (meaning this test can be used) for the duration of the COVID-19 declaration under Section 564(b)(1) of the Act, 21 U.S.C. section 360bbb-3(b)(1), unless the authorization is terminated or revoked.  Performed at [facility], [address] [place], [place] [address]          Radiology Studies: No results found.      Scheduled Meds:  enoxaparin (LOVENOX) injection  40 mg Subcutaneous Q24H   Continuous Infusions:  sodium chloride     dextrose 5 % and 0.45% NaCl 125 mL/hr at 06/17/21 0700   insulin 1.6 Units/hr (06/17/21 0904)    Assessment & Plan:   Active Problems:   DKA (diabetic ketoacidosis) (HCC)   DKA Was placed on DKA protocol No signs of infection.  Procalcitonin <0.1 UA negative  Bcx pending ?compliance.  Will ck A1C Gap closed. Will  order beta hydrox.   Diabetic educator consulted-input appreciated Give Levemir 2H prior to discontinuation of drip Levemir 10 units twice daily NovoLog 0 to 9 units 3 times daily with meals and 0-5 at bedtime NovoLog 5 units 3 times daily with meals if eating more than 50% of meals Monitor bg per protocal Start carb control Discussed with Nsg Mrs. Beeman about protocal, weaning, giving insulin per protocal. Need to monitor closely .   DVT prophylaxis: Lovenox Code Status: Full Family Communication: None at bedside Disposition Plan:  Status is: Inpatient  Remains inpatient appropriate because:Inpatient level of care appropriate due to severity of illness  Dispo: The patient is from: Home              Anticipated d/c is to: Home              Patient  currently is not medically stable to d/c.   Difficult to place patient No            LOS: 1 day   Time spent: 35 minutes with more than 50% on COC    Lynn Ito, MD [facility] Pager [phone]  If 7PM-7AM, please contact night-coverage 06/17/2021, 9:13 AM

## 2021-06-17 NOTE — Progress Notes (Signed)
Pt has been nauseas and vomiting through the night ... will remain on insulin gtt per Dr. Antionette Char and will be re-assessed by day MD

## 2021-06-17 NOTE — Progress Notes (Addendum)
Pt resting comfortably. Wakes to vomit about every hour now, small amount. Zofran given IV.  Pt states he feels ok, just "really thirsty". Notified Dr. Marylu Lund and requested ice chips. Will await her response.

## 2021-06-17 NOTE — Progress Notes (Signed)
Inpatient Diabetes Program Recommendations  AACE/ADA: New Consensus Statement on Inpatient Glycemic Control (2015)  Target Ranges:  Prepandial:   less than 140 mg/dL      Peak postprandial:   less than 180 mg/dL (1-2 hours)      Critically ill patients:  140 - 180 mg/dL   Lab Results  Component Value Date   GLUCAP 166 (H) 06/17/2021   HGBA1C 11.1 (H) 01/03/2020    Review of Glycemic Control  Diabetes history: DM1 Outpatient Diabetes medications: NPH 70/30 23 units BID, Novollin R 1-10 units TID with meals Current orders for Inpatient glycemic control: IV insulin per DKA orders  HgbA1C - 11.1%  Inpatient Diabetes Program Recommendations:    Give Levemir 2H prior to discontinuation of drip. Levemir 10 units BID Novolog 0-9 units TID with meals and 0-5 HS Novolog 5 units TID with meals if eating > 50% meal  Diabetes Coordinator working remotely today. Will f/u with pt in the morning.  Continue to follow.  Thank you. Ailene Ards, RD, LDN, CDE Inpatient Diabetes Coordinator [phone]

## 2021-06-18 DIAGNOSIS — E1010 Type 1 diabetes mellitus with ketoacidosis without coma: Secondary | ICD-10-CM | POA: Diagnosis present

## 2021-06-18 LAB — GLUCOSE, CAPILLARY
Glucose-Capillary: 218 mg/dL — ABNORMAL HIGH (ref 70–99)
Glucose-Capillary: 185 mg/dL — ABNORMAL HIGH (ref 70–99)
Glucose-Capillary: 76 mg/dL (ref 70–99)
Glucose-Capillary: 97 mg/dL (ref 70–99)
Glucose-Capillary: 123 mg/dL — ABNORMAL HIGH (ref 70–99)

## 2021-06-18 LAB — CBG MONITORING, ED
Glucose-Capillary: 78 mg/dL (ref 70–99)
Glucose-Capillary: 125 mg/dL — ABNORMAL HIGH (ref 70–99)
Glucose-Capillary: 166 mg/dL — ABNORMAL HIGH (ref 70–99)

## 2021-06-18 LAB — PROCALCITONIN: Procalcitonin: <0.1 ng/mL

## 2021-06-18 LAB — HEMOGLOBIN A1C
Hgb A1c MFr Bld: 10.7 % — ABNORMAL HIGH (ref 4.8–5.6)
Mean Plasma Glucose: 260 mg/dL

## 2021-06-18 MED ORDER — TRAZODONE HCL 50 MG PO TABS
50.0000 mg | ORAL_TABLET | Freq: Every day | ORAL | Status: DC
  Administered 2021-06-18: 50 mg via ORAL
  Filled 2021-06-18 (×2): qty 1

## 2021-06-18 NOTE — Progress Notes (Signed)
PROGRESS NOTE    Wesley [name]  GYB:[id number] DOB: 03-Mar-1996 DOA: 06/16/2021 [facility]: [facility]   Follow-up on DKA. Brief Narrative:  Wesley [name] is a 25 y.o. male with medical history significant of  DM Type 1 since age of 35 , who has has had 2 days of elevated sugars at home and vague feeling of being unwell and low appetite and fatigue. He also noted increase thirst. Despite being complaint with medications. He notes cough , sob, chest pain, abdominal pain , n/v/ diarrhea or dysuria.  In ed on evaluation patient found to have acute DKA. His anion gap was closed on 9/5, transitioned to Lantus.  Assessment & Plan:   Active Problems:   DKA (diabetic ketoacidosis) (HCC)   DKA, type 1, not at goal [facility])  Patient condition has improved, he still has some nausea, but no vomiting.  Tolerating diet. I will keep him another day to adjust insulin dose.  Planning to discharge home tomorrow.    DVT prophylaxis: Lovenox Code Status: Full Family Communication:  Disposition Plan:    Status is: Inpatient  Remains inpatient appropriate because:Inpatient level of care appropriate due to severity of illness  Dispo: The patient is from: Home              Anticipated d/c is to: Home              Patient currently is not medically stable to d/c.   Difficult to place patient No        I/O last 3 completed shifts: In: 3721.7 [I.V.:3721.7] Out: 1400 [Urine:1400] No intake/output data recorded.     Consultants:  None  Procedures: None  Antimicrobials:None  Subjective: Patient still has some nausea, but no vomiting.  Tolerating diet. No abdominal pain. No short of breath or cough No dysuria hematuria.  Objective: Vitals:   06/18/21 0620 06/18/21 0748 06/18/21 0800 06/18/21 0949  BP: 127/84 (!) 131/93 (!) 158/99 (!) 141/93  Pulse:  72 61 (!) 56  Resp: 17 11 19 16   Temp: 98.7 F (37.1 C)   98.5 F (36.9 C)  TempSrc: Oral   Oral  SpO2: 100%  100%  100%  Weight:      Height:        Intake/Output Summary (Last 24 hours) at 06/18/2021 1501 Last data filed at 06/18/2021 0316 Gross per 24 hour  Intake 2115.4 ml  Output 900 ml  Net 1215.4 ml   Filed Weights   06/16/21 1233  Weight: 79.4 kg    Examination:  General exam: Appears calm and comfortable  Respiratory system: Clear to auscultation. Respiratory effort normal. Cardiovascular system: S1 & S2 heard, RRR. No JVD, murmurs, rubs, gallops or clicks. No pedal edema. Gastrointestinal system: Abdomen is nondistended, soft and nontender. No organomegaly or masses felt. Normal bowel sounds heard. Central nervous system: Alert and oriented. No focal neurological deficits. Extremities: Symmetric 5 x 5 power. Skin: No rashes, lesions or ulcers Psychiatry: Judgement and insight appear normal. Mood & affect appropriate.     Data Reviewed: I have personally reviewed following labs and imaging studies  CBC: Recent Labs  Lab 06/16/21 1237  WBC 8.6  HGB 16.3  HCT 47.8  MCV 91.0  PLT 261   Basic Metabolic Panel: Recent Labs  Lab 06/16/21 1441 06/16/21 2046 06/17/21 0058 06/17/21 0614 06/17/21 1710  NA 133* 138 138 138 138  K 6.7* 4.7 4.8 4.1 3.6  CL 102 110 108 108 109  CO2 9*  14* 20* 21* 22  GLUCOSE 568* 214* 208* 179* 114*  BUN 22* 20 16 13 10   CREATININE 1.35* 1.09 1.03 0.92 0.92  CALCIUM 8.7* 9.1 8.9 8.9 8.8*  MG  --   --  1.9  --   --    GFR: Estimated Creatinine Clearance: 123.8 mL/min (by C-G formula based on SCr of 0.92 mg/dL). Liver Function Tests: No results for input(s): AST, ALT, ALKPHOS, BILITOT, PROT, ALBUMIN in the last 168 hours. No results for input(s): LIPASE, AMYLASE in the last 168 hours. No results for input(s): AMMONIA in the last 168 hours. Coagulation Profile: No results for input(s): INR, PROTIME in the last 168 hours. Cardiac Enzymes: No results for input(s): CKTOTAL, CKMB, CKMBINDEX, TROPONINI in the last 168 hours. BNP (last 3  results) No results for input(s): PROBNP in the last 8760 hours. HbA1C: Recent Labs    06/16/21 2046 06/17/21 1710  HGBA1C 10.7* 10.5*   CBG: Recent Labs  Lab 06/18/21 0315 06/18/21 0618 06/18/21 0759 06/18/21 1016 06/18/21 1145  GLUCAP 78 125* 166* 218* 185*   Lipid Profile: No results for input(s): CHOL, HDL, LDLCALC, TRIG, CHOLHDL, LDLDIRECT in the last 72 hours. Thyroid Function Tests: No results for input(s): TSH, T4TOTAL, FREET4, T3FREE, THYROIDAB in the last 72 hours. Anemia Panel: No results for input(s): VITAMINB12, FOLATE, FERRITIN, TIBC, IRON, RETICCTPCT in the last 72 hours. Sepsis Labs: Recent Labs  Lab 06/16/21 2046 06/17/21 0614 06/18/21 0722  PROCALCITON <0.10 <0.10 <0.10    Recent Results (from the past 240 hour(s))  Resp Panel by RT-PCR (Flu A&B, Covid) Nasopharyngeal Swab     Status: None   Collection Time: 06/16/21  2:06 PM   Specimen: Nasopharyngeal Swab; Nasopharyngeal(NP) swabs in vial transport medium  Result Value Ref Range Status   SARS Coronavirus 2 by RT PCR NEGATIVE NEGATIVE Final    Comment: (NOTE) SARS-CoV-2 target nucleic acids are NOT DETECTED.  The SARS-CoV-2 RNA is generally detectable in upper respiratory specimens during the acute phase of infection. The lowest concentration of SARS-CoV-2 viral copies this assay can detect is 138 copies/mL. A negative result does not preclude SARS-Cov-2 infection and should not be used as the sole basis for treatment or other patient management decisions. A negative result may occur with  improper specimen collection/handling, submission of specimen other than nasopharyngeal swab, presence of viral mutation(s) within the areas targeted by this assay, and inadequate number of viral copies(<138 copies/mL). A negative result must be combined with clinical observations, patient history, and epidemiological information. The expected result is Negative.  Fact Sheet for Patients:   08/16/21  Fact Sheet for Healthcare Providers:  BloggerCourse.com  This test is no t yet approved or cleared by the [place] FDA and  has been authorized for detection and/or diagnosis of SARS-CoV-2 by FDA under an Emergency Use Authorization (EUA). This EUA will remain  in effect (meaning this test can be used) for the duration of the COVID-19 declaration under Section 564(b)(1) of the Act, 21 U.S.C.section 360bbb-3(b)(1), unless the authorization is terminated  or revoked sooner.       Influenza A by PCR NEGATIVE NEGATIVE Final   Influenza B by PCR NEGATIVE NEGATIVE Final    Comment: (NOTE) The Xpert Xpress SARS-CoV-2/FLU/RSV plus assay is intended as an aid in the diagnosis of influenza from Nasopharyngeal swab specimens and should not be used as a sole basis for treatment. Nasal washings and aspirates are unacceptable for Xpert Xpress SARS-CoV-2/FLU/RSV testing.  Fact Sheet for Patients: [place]  Fact Sheet for Healthcare Providers: SeriousBroker.it  This test is not yet approved or cleared by the [place] FDA and has been authorized for detection and/or diagnosis of SARS-CoV-2 by FDA under an Emergency Use Authorization (EUA). This EUA will remain in effect (meaning this test can be used) for the duration of the COVID-19 declaration under Section 564(b)(1) of the Act, 21 U.S.C. section 360bbb-3(b)(1), unless the authorization is terminated or revoked.  Performed at [facility], [address] [place], [place] [address]   CULTURE, BLOOD (ROUTINE X 2) w Reflex to ID Panel     Status: None (Preliminary result)   Collection Time: 06/16/21  8:46 PM   Specimen: BLOOD  Result Value Ref Range Status   Specimen Description BLOOD BLOOD RIGHT HAND  Final   Special Requests   Final    BOTTLES DRAWN AEROBIC AND ANAEROBIC Blood Culture  adequate volume   Culture   Final    NO GROWTH 2 DAYS Performed at [facility], [address] [place], [place] [address]    Report Status PENDING  Incomplete  CULTURE, BLOOD (ROUTINE X 2) w Reflex to ID Panel     Status: None (Preliminary result)   Collection Time: 06/16/21  8:46 PM   Specimen: BLOOD  Result Value Ref Range Status   Specimen Description BLOOD BLOOD RIGHT ARM  Final   Special Requests   Final    BOTTLES DRAWN AEROBIC AND ANAEROBIC Blood Culture adequate volume   Culture  Setup Time NO ORGANISMS SEEN  Final   Culture   Final    NO ORGANISMS SEEN Performed at [facility], [address] [place], [place] [address]    Report Status PENDING  Incomplete         Radiology Studies: No results found.      Scheduled Meds:  enoxaparin (LOVENOX) injection  40 mg Subcutaneous Q24H   insulin aspart  0-15 Units Subcutaneous Q4H   insulin aspart  5 Units Subcutaneous TID WC   insulin detemir  10 Units Subcutaneous BID   traZODone  50 mg Oral QHS   Continuous Infusions:   LOS: 2 days    Time spent: 27 minutes    Marrion Coy, MD [facility]   To contact the attending provider between 7A-7P or the covering provider during after hours 7P-7A, please log into the web site [email or web address] and access using universal [place] password for that web site. If you do not have the password, please call the hospital operator.  06/18/2021, 3:01 PM

## 2021-06-18 NOTE — Progress Notes (Signed)
Inpatient Diabetes Program Recommendations  AACE/ADA: New Consensus Statement on Inpatient Glycemic Control (2015)  Target Ranges:  Prepandial:   less than 140 mg/dL      Peak postprandial:   less than 180 mg/dL (1-2 hours)      Critically ill patients:  140 - 180 mg/dL   Lab Results  Component Value Date   GLUCAP 185 (H) 06/18/2021   HGBA1C 10.5 (H) 06/17/2021    Review of Glycemic Control  Diabetes history: DM1 Outpatient Diabetes medications: NPH 70/30 23 units BID, Novollin R 1-10 units TID with meals Current orders for Inpatient glycemic control: Levemir 10 units bid, Novolog 5 units tid meal coverage if eats >50%, Novolog 0-9 units tid, 0-5 units hs  Inpatient Diabetes Program Recommendations:   Patient requests: -Referral to endocrinologist @ discharge.  Spoke with patient @ bedside and discussed sick day rules with patient. Patient states he continued to take his insulin as prescribed although he wasn't eating and took Novolin R to cover correction as well but CBGs continued to increase @ home. Patient buys vials of insulin instead of insulin pens, but reviewed that Novolin Relion 70/30 from [employer] is also available. Patient states he just started a new job and hoping to have insurance benefits in 3 months. Transition of care consult to review if medication support available until patient's insurance starts so can receive longer acting insulin and short acting meal coverage option. Will follow during hospitalization.  Thank you, Wesley [name]. Kamica Florance, RN, MSN, CDE  Diabetes Coordinator Inpatient Glycemic Control Team Team Pager [phone] (8am-5pm) 06/18/2021 2:34 PM

## 2021-06-18 NOTE — ED Notes (Signed)
Maggie RN aware of assigned bed 

## 2021-06-19 DIAGNOSIS — E876 Hypokalemia: Secondary | ICD-10-CM

## 2021-06-19 LAB — BASIC METABOLIC PANEL
Anion gap: 5 (ref 5–15)
BUN: 7 mg/dL (ref 6–20)
CO2: 26 mmol/L (ref 22–32)
Calcium: 8.5 mg/dL — ABNORMAL LOW (ref 8.9–10.3)
Chloride: 109 mmol/L (ref 98–111)
Creatinine, Ser: 0.71 mg/dL (ref 0.61–1.24)
GFR, Estimated: >60 mL/min (ref >60)
Glucose, Bld: 104 mg/dL — ABNORMAL HIGH (ref 70–99)
Potassium: 3.2 mmol/L — ABNORMAL LOW (ref 3.5–5.1)
Sodium: 140 mmol/L (ref 135–145)

## 2021-06-19 LAB — GLUCOSE, CAPILLARY
Glucose-Capillary: 106 mg/dL — ABNORMAL HIGH (ref 70–99)
Glucose-Capillary: 145 mg/dL — ABNORMAL HIGH (ref 70–99)
Glucose-Capillary: 174 mg/dL — ABNORMAL HIGH (ref 70–99)

## 2021-06-19 LAB — MAGNESIUM: Magnesium: 1.9 mg/dL (ref 1.7–2.4)

## 2021-06-19 MED ORDER — INSULIN ASPART 100 UNIT/ML IJ SOLN: 3.0000 [IU] | Freq: Three times a day (TID) | INTRAMUSCULAR | Status: DC

## 2021-06-19 MED ORDER — POTASSIUM CHLORIDE CRYS ER 20 MEQ PO TBCR
40.0000 meq | EXTENDED_RELEASE_TABLET | ORAL | Status: AC
  Administered 2021-06-19 (×2): 40 meq via ORAL
  Filled 2021-06-19 (×2): qty 2

## 2021-06-19 NOTE — Progress Notes (Signed)
PHARMACY - PHYSICIAN COMMUNICATION CRITICAL VALUE ALERT - BLOOD CULTURE IDENTIFICATION (BCID)  Wesley [name] is an 25 y.o. male who presented to [facility] on 06/16/2021 with a chief complaint of high blood sugars  Assessment:  9/4 Blood culture with micrococcus 1 of 4 bottles  Name of physician (or Provider) Contacted: Dr Chipper Herb     Current antibiotics: none  Changes to prescribed antibiotics recommended:  Patient discharged earlier today.  Blood culture represents contaminant  No results found for this or any previous visit.  Juliette Alcide, PharmD, BCPS.   Work Cell: [phone] 06/19/2021 1:26 PM

## 2021-06-19 NOTE — TOC Initial Note (Signed)
Transition of Care [facility]) - Initial/Assessment Note    Patient Details  Name: Wesley [name] MRN: [id number] Date of Birth: 1996-04-28  [facility] CM/SW Contact:    Chapman Fitch, RN Phone Number: 06/19/2021, 11:46 AM  Clinical Narrative:                    Financial counseling has confirmed that patient has active insurance coverage.   Patient notified Patient to continue currently insulin regimen .  Patient confirms that if there are any issues with the coverage that he can pay for the insulin out of pocket until his new insurance kicks in  Patient confirms that he has testing supplies and glucometer      Patient Goals and CMS Choice        Expected Discharge Plan and Services           Expected Discharge Date: 06/19/21                                    Prior Living Arrangements/Services                       Activities of Daily Living Home Assistive Devices/Equipment: None ADL Screening (condition at time of admission) Patient's cognitive ability adequate to safely complete daily activities?: Yes Is the patient deaf or have difficulty hearing?: No Does the patient have difficulty seeing, even when wearing glasses/contacts?: No Does the patient have difficulty concentrating, remembering, or making decisions?: No Patient able to express need for assistance with ADLs?: Yes Does the patient have difficulty dressing or bathing?: No Independently performs ADLs?: Yes (appropriate for developmental age) Does the patient have difficulty walking or climbing stairs?: No Weakness of Legs: None Weakness of Arms/Hands: None  Permission Sought/Granted                  Emotional Assessment              Admission diagnosis:  DKA (diabetic ketoacidosis) (HCC) [E11.10] DKA, type 1, not at goal [facility]) [E10.10] Type 1 diabetes mellitus with ketoacidosis without coma (HCC) [E10.10] Patient Active Problem List   Diagnosis Date Noted    Hypokalemia 06/19/2021   DKA, type 1, not at goal [facility]) 06/18/2021   DKA (diabetic ketoacidosis) (HCC) 06/16/2021   Perineal abscess 01/03/2020   Hyperglycemia due to type 1 diabetes mellitus (HCC) 01/03/2020   Lactic acidosis 01/03/2020   DKA, type 1 (HCC) 08/23/2019   Cellulitis of scrotum 07/18/2019   Acute renal failure (HCC)    Tobacco use disorder 02/10/2018   DKA (diabetic ketoacidoses) 09/01/2016   Sepsis (HCC) 10/02/2015   Scrotal abscess    Diabetes mellitus with ketoacidosis (HCC) 08/13/2012   Type 1 diabetes mellitus (HCC) 02/06/2012   PCP:  [facility] Pharmacy:   [facility] [address] ([address]), [place] - [address] [place]) [place] [address] Phone: [phone] Fax: [phone]     Social Determinants of Health (SDOH) Interventions    Readmission Risk Interventions No flowsheet data found.

## 2021-06-19 NOTE — Discharge Summary (Signed)
Physician Discharge Summary  Patient ID: Wesley [name] MRN: [id number] DOB/AGE: June 21, 1996 25 y.o.  Admit date: 06/16/2021 Discharge date: 06/19/2021  Admission Diagnoses:  Discharge Diagnoses:  Active Problems:   DKA (diabetic ketoacidosis) ([place])   DKA, type 1, not at goal [facility])   Hypokalemia   Discharged Condition: good  Hospital Course:  Glendal [name] is a 25 y.o. male with medical history significant of  DM Type 1 since age of 10 , who has has had 2 days of elevated sugars at home and vague feeling of being unwell and low appetite and fatigue. He also noted increase thirst. Despite being complaint with medications. He notes cough , sob, chest pain, abdominal pain , n/v/ diarrhea or dysuria.  In ed on evaluation patient found to have acute DKA. His anion gap was closed on 9/5, transitioned to Lantus.  Patient was kept in the hospital for additional day due to the fact that the patient had constant nausea, poor p.o. intake, had only 10% of her meals taken.  He also received multiple doses of Zofran and IV Phenergan.  It was unsafe to discharge yesterday for concerns of developing hypoglycemia without adequate p.o. intake.  Patient woke up this morning feeling refreshed, appetite is coming back.  No nausea vomiting.  As result, will resume patient regular insulin dose by evening time after discharge from hospital.  Patient is advised to follow-up with PCP within 1 week.  He probably should be referred to endocrinologist.     Consults: None  Significant Diagnostic Studies:   Treatments: IV fluids, IV insulin  Discharge Exam: Blood pressure 103/67, pulse 75, temperature 98.1 F (36.7 C), temperature source Oral, resp. rate 15, height _0  (1.753 m), weight 79.4 kg, SpO2 98 %. General appearance: alert and cooperative Resp: clear to auscultation bilaterally Cardio: regular rate and rhythm, S1, S2 normal, no murmur, click, rub or gallop GI: soft, non-tender; bowel sounds  normal; no masses,  no organomegaly Extremities: extremities normal, atraumatic, no cyanosis or edema  Disposition: Discharge disposition: 01-Home or Self Care       Discharge Instructions     Diet Carb Modified   Complete by: As directed    Increase activity slowly   Complete by: As directed       Allergies as of 06/19/2021   No Known Allergies      Medication List     TAKE these medications    blood glucose meter kit and supplies Kit Dispense based on patient and insurance preference. Use up to four times daily as directed. (FOR ICD-9 250.00, 250.01).   glucose blood test strip Use as instructed   insulin NPH-regular Human (70-30) 100 UNIT/ML injection Inject 23 Units into the skin 2 (two) times daily with a meal.   insulin regular 100 units/mL injection Commonly known as: NOVOLIN R Inject 0.01-0.1 mLs (1-10 Units total) into the skin 3 (three) times daily before meals. Per sliding scale   Insulin Syringes (Disposable) U-100 0.5 ML Misc 40 Units by Does not apply route 3 (three) times daily.   onetouch ultrasoft lancets Please use as prescribed        Follow-up [place], [place] Follow up in 1 week(s).   Specialty: General Practice Contact information: [address] [place] [address] [phone]                 Signed: Sharen Hones 06/19/2021, 8:55 AM

## 2021-06-19 NOTE — Progress Notes (Signed)
Patient given instructions to make follow up appointment(attempted to call community clinic), when to return for worsening symptoms, IV taken out by nursing student, burn covered with Foam dressing, & patient discharged ambulatory with nurse.

## 2021-06-20 LAB — CULTURE, BLOOD (ROUTINE X 2)
Culture  Setup Time: NONE SEEN
Special Requests: ADEQUATE

## 2021-06-21 LAB — CULTURE, BLOOD (ROUTINE X 2)
Culture: NO GROWTH
Special Requests: ADEQUATE

## 2022-01-02 ENCOUNTER — Emergency Department
Admission: EM | Admit: 2022-01-02 | Discharge: 2022-01-02 | Disposition: A | Discharge status: Home / Self Care | Payer: BC Managed Care – PPO | Attending: Emergency Medicine | Admitting: Emergency Medicine

## 2022-01-02 ENCOUNTER — Encounter: Payer: Self-pay | Admitting: *Deleted

## 2022-01-02 ENCOUNTER — Other Ambulatory Visit: Payer: Self-pay

## 2022-01-02 ENCOUNTER — Emergency Department: Payer: BC Managed Care – PPO

## 2022-01-02 DIAGNOSIS — R1010 Upper abdominal pain, unspecified: Secondary | ICD-10-CM | POA: Diagnosis present

## 2022-01-02 DIAGNOSIS — R112 Nausea with vomiting, unspecified: Secondary | ICD-10-CM | POA: Insufficient documentation

## 2022-01-02 DIAGNOSIS — Z20822 Contact with and (suspected) exposure to covid-19: Secondary | ICD-10-CM | POA: Diagnosis not present

## 2022-01-02 DIAGNOSIS — R739 Hyperglycemia, unspecified: Secondary | ICD-10-CM

## 2022-01-02 DIAGNOSIS — R5381 Other malaise: Secondary | ICD-10-CM | POA: Insufficient documentation

## 2022-01-02 DIAGNOSIS — E1065 Type 1 diabetes mellitus with hyperglycemia: Secondary | ICD-10-CM | POA: Diagnosis not present

## 2022-01-02 DIAGNOSIS — E876 Hypokalemia: Secondary | ICD-10-CM | POA: Insufficient documentation

## 2022-01-02 LAB — CBG MONITORING, ED: Glucose-Capillary: 249 mg/dL — ABNORMAL HIGH (ref 70–99)

## 2022-01-02 LAB — URINALYSIS, ROUTINE W REFLEX MICROSCOPIC
Bacteria, UA: NONE SEEN
Bilirubin Urine: NEGATIVE
Glucose, UA: >=500 mg/dL — AB
Hgb urine dipstick: NEGATIVE
Ketones, ur: 80 mg/dL — AB
Leukocytes,Ua: NEGATIVE
Nitrite: NEGATIVE
Protein, ur: 30 mg/dL — AB
Specific Gravity, Urine: 1.039 — ABNORMAL HIGH (ref 1.005–1.030)
Squamous Epithelial / HPF: NONE SEEN (ref 0–5)
pH: 7 (ref 5.0–8.0)

## 2022-01-02 LAB — COMPREHENSIVE METABOLIC PANEL
ALT: 15 U/L (ref 0–44)
AST: 17 U/L (ref 15–41)
Albumin: 1.8 g/dL — ABNORMAL LOW (ref 3.5–5.0)
Alkaline Phosphatase: 35 U/L — ABNORMAL LOW (ref 38–126)
Anion gap: 4 — ABNORMAL LOW (ref 5–15)
BUN: <5 mg/dL — ABNORMAL LOW (ref 6–20)
CO2: 13 mmol/L — ABNORMAL LOW (ref 22–32)
Calcium: 4.1 mg/dL — CL (ref 8.9–10.3)
Chloride: 127 mmol/L — ABNORMAL HIGH (ref 98–111)
Creatinine, Ser: <0.3 mg/dL — ABNORMAL LOW (ref 0.61–1.24)
Glucose, Bld: 144 mg/dL — ABNORMAL HIGH (ref 70–99)
Potassium: <2 mmol/L — CL (ref 3.5–5.1)
Sodium: 144 mmol/L (ref 135–145)
Total Bilirubin: 0.5 mg/dL (ref 0.3–1.2)
Total Protein: 3.1 g/dL — ABNORMAL LOW (ref 6.5–8.1)

## 2022-01-02 LAB — BASIC METABOLIC PANEL
Anion gap: 9 (ref 5–15)
BUN: 10 mg/dL (ref 6–20)
CO2: 27 mmol/L (ref 22–32)
Calcium: 9.2 mg/dL (ref 8.9–10.3)
Chloride: 101 mmol/L (ref 98–111)
Creatinine, Ser: 0.98 mg/dL (ref 0.61–1.24)
GFR, Estimated: >60 mL/min (ref >60)
Glucose, Bld: 267 mg/dL — ABNORMAL HIGH (ref 70–99)
Potassium: 4.5 mmol/L (ref 3.5–5.1)
Sodium: 137 mmol/L (ref 135–145)

## 2022-01-02 LAB — CBC
HCT: 45.2 % (ref 39.0–52.0)
Hemoglobin: 15 g/dL (ref 13.0–17.0)
MCH: 29.1 pg (ref 26.0–34.0)
MCHC: 33.2 g/dL (ref 30.0–36.0)
MCV: 87.8 fL (ref 80.0–100.0)
Platelets: 229 10*3/uL (ref 150–400)
RBC: 5.15 MIL/uL (ref 4.22–5.81)
RDW: 12.9 % (ref 11.5–15.5)
WBC: 5.2 10*3/uL (ref 4.0–10.5)
nRBC: 0 % (ref 0.0–0.2)

## 2022-01-02 LAB — LIPASE, BLOOD: Lipase: 26 U/L (ref 11–51)

## 2022-01-02 LAB — RESP PANEL BY RT-PCR (FLU A&B, COVID) ARPGX2
Influenza A by PCR: NEGATIVE
Influenza B by PCR: NEGATIVE
SARS Coronavirus 2 by RT PCR: NEGATIVE

## 2022-01-02 LAB — MAGNESIUM: Magnesium: 1.7 mg/dL (ref 1.7–2.4)

## 2022-01-02 MED ORDER — ONDANSETRON 4 MG PO TBDP: 4.0000 mg | ORAL_TABLET | Freq: Three times a day (TID) | ORAL | 0 refills | Status: DC | PRN

## 2022-01-02 MED ORDER — LACTATED RINGERS IV BOLUS
1000.0000 mL | Freq: Once | INTRAVENOUS | Status: AC
  Administered 2022-01-02: 1000 mL via INTRAVENOUS

## 2022-01-02 NOTE — ED Provider Notes (Signed)
? ?[facility] ?Provider Note ? ? ? Event Date/Time  ? First MD Initiated Contact with Patient 01/02/22 2050   ?  (approximate) ? ? ?History  ? ?Chief Complaint ?Abdominal Pain and Hyperglycemia ? ? ?HPI ? ?Wesley [name] is a 26 y.o. male with past medical history of type 1 diabetes who presents to the ED complaining of abdominal pain and nausea and vomiting.  Patient reports that he has been dealing with persistent nausea and vomiting over the course of today along with significant malaise.  He states he was having pain in his upper abdomen earlier today but this has since resolved.  He states that symptoms feel similar to when he has dealt with DKA in the past.  He reports being compliant with his insulin regimen and denies any infectious symptoms.  He specifically denies any fevers, cough, chest pain, shortness of breath, dysuria, or hematuria. ?  ? ? ?Physical Exam  ? ?Triage Vital Signs: ?ED Triage Vitals  ?Enc Vitals Group  ?   BP 01/02/22 2026 (!) 142/97  ?   Pulse Rate 01/02/22 2026 63  ?   Resp 01/02/22 2026 20  ?   Temp 01/02/22 2026 98.7 ?F (37.1 ?C)  ?   Temp Source 01/02/22 2026 Oral  ?   SpO2 01/02/22 2026 99 %  ?   Weight 01/02/22 2023 180 lb (81.6 kg)  ?   Height 01/02/22 2023 5\' 9"  (1.753 m)  ?   Head Circumference --   ?   Peak Flow --   ?   Pain Score 01/02/22 2023 6  ?   Pain Loc --   ?   Pain Edu? --   ?   Excl. in [place]? --   ? ? ?Most recent vital signs: ?Vitals:  ? 01/02/22 2200 01/02/22 2230  ?BP: (!) 151/106 (!) 142/101  ?Pulse: 72 74  ?Resp:  16  ?Temp:    ?SpO2: 100% 100%  ? ? ?Constitutional: Alert and oriented. ?Eyes: Conjunctivae are normal. ?Head: Atraumatic. ?Nose: No congestion/rhinnorhea. ?Mouth/Throat: Mucous membranes are dry. ?Cardiovascular: Normal rate, regular rhythm. Grossly normal heart sounds.  2+ radial pulses bilaterally. ?Respiratory: Normal respiratory effort.  No retractions. Lungs CTAB. ?Gastrointestinal: Soft and nontender. No  distention. ?Musculoskeletal: No lower extremity tenderness nor edema.  ?Neurologic:  Normal speech and language. No gross focal neurologic deficits are appreciated. ? ? ? ?ED Results / Procedures / Treatments  ? ?Labs ?(all labs ordered are listed, but only abnormal results are displayed) ?Labs Reviewed  ?COMPREHENSIVE METABOLIC PANEL - Abnormal; Notable for the following components:  ?    Result Value  ? Potassium <2.0 (*)   ? Chloride 127 (*)   ? CO2 13 (*)   ? Glucose, Bld 144 (*)   ? BUN <5 (*)   ? Creatinine, Ser <0.30 (*)   ? Calcium 4.1 (*)   ? Total Protein 3.1 (*)   ? Albumin 1.8 (*)   ? Alkaline Phosphatase 35 (*)   ? Anion gap 4 (*)   ? All other components within normal limits  ?BASIC METABOLIC PANEL - Abnormal; Notable for the following components:  ? Glucose, Bld 267 (*)   ? All other components within normal limits  ?CBG MONITORING, ED - Abnormal; Notable for the following components:  ? Glucose-Capillary 249 (*)   ? All other components within normal limits  ?RESP PANEL BY RT-PCR (FLU A&B, COVID) [id number]  ?LIPASE, BLOOD  ?CBC  ?MAGNESIUM  ?URINALYSIS,  ROUTINE W REFLEX MICROSCOPIC  ? ? ?ED ECG REPORT ?Tempie Hoist, the attending physician, personally viewed and interpreted this ECG. ? ? Date: 01/02/2022 ? EKG Time: 22:26 ? Rate: 78 ? Rhythm: normal sinus rhythm ? Axis: Normal ? Intervals:none ? ST&T Change: None ? ? ?PROCEDURES: ? ?Critical Care performed: No ? ?Procedures ? ? ?MEDICATIONS ORDERED IN ED: ?Medications  ?lactated ringers bolus 1,000 mL (1,000 mLs Intravenous New Bag/Given 01/02/22 2111)  ? ? ? ?IMPRESSION / MDM / ASSESSMENT AND PLAN / ED COURSE  ?I reviewed the triage vital signs and the nursing notes. ?             ?               ? ?26 y.o. male with past medical history of type 1 diabetes who presents to the ED complaining of malaise, nausea, vomiting, and abdominal pain over the course of the day today. ? ?Differential diagnosis includes, but is not limited to, DKA,  hyperglycemia, other electrolyte abnormality, pancreatitis, cholecystitis, gastritis, GERD. ? ?Patient nontoxic-appearing and in no acute distress, vital signs are reassuring but he does appear dehydrated.  Patient's blood glucose was noted to be elevated by EMS and we will further assess for DKA or electrolyte abnormality with labs, hydrate with additional IV fluids.  Patient denies noncompliance with his insulin regimen and we will further assess for infectious process with UA and chest x-ray.  He does state that abdominal pain has resolved and no tenderness noted on exam to necessitate CT imaging. ? ?Initial CMP showed significant hypokalemia and hypocalcemia, however these results were questionable given patient's clinical picture.  Repeat BMP is within normal limits with no hypokalemia or hypocalcemia, meaning that the initial CMP was likely diluted.  LFTs are within normal limits and lipase is also unremarkable.  CBC without anemia or leukocytosis.  Patient is feeling better following IV fluid bolus and is tolerating liquids without difficulty.  No evidence of DKA at this time and patient is appropriate for outpatient follow-up.  We will prescribe Zofran and he was counseled to follow-up with his PCP, otherwise return to the ED for new or worsening symptoms.  Patient agrees with plan. ? ?  ? ? ?FINAL CLINICAL IMPRESSION(S) / ED DIAGNOSES  ? ?Final diagnoses:  ?Nausea and vomiting, unspecified vomiting type  ?Hyperglycemia  ? ? ? ?Rx / DC Orders  ? ?ED Discharge Orders   ? ?      Ordered  ?  ondansetron (ZOFRAN-ODT) 4 MG disintegrating tablet  Every 8 hours PRN       ? 01/02/22 2305  ? ?  ?  ? ?  ? ? ? ?Note:  This document was prepared using Dragon voice recognition software and may include unintentional dictation errors. ?  ?Blake Divine, MD ?01/02/22 2306 ? ?

## 2022-01-02 NOTE — ED Triage Notes (Signed)
Pt to triage via w/c, vomiting; EMS brings in for c/o N/V and hyperglycemia today ?

## 2022-01-02 NOTE — ED Notes (Signed)
ED Provider at bedside. 

## 2022-01-02 NOTE — ED Triage Notes (Signed)
Pt brought in via ems from home with high blood sugar and abd pain with vomiting.  Iv fluids infusing.  Pt alert.   ?

## 2022-01-02 NOTE — ED Notes (Signed)
Declines CXR til nausea improves. Provider notified.  ?

## 2022-01-04 ENCOUNTER — Emergency Department
Admission: EM | Admit: 2022-01-04 | Discharge: 2022-01-04 | Disposition: A | Discharge status: Home / Self Care | Payer: BC Managed Care – PPO | Attending: Emergency Medicine | Admitting: Emergency Medicine

## 2022-01-04 ENCOUNTER — Emergency Department: Payer: BC Managed Care – PPO

## 2022-01-04 ENCOUNTER — Other Ambulatory Visit: Payer: Self-pay

## 2022-01-04 DIAGNOSIS — E1065 Type 1 diabetes mellitus with hyperglycemia: Secondary | ICD-10-CM | POA: Insufficient documentation

## 2022-01-04 DIAGNOSIS — R112 Nausea with vomiting, unspecified: Secondary | ICD-10-CM

## 2022-01-04 LAB — COMPREHENSIVE METABOLIC PANEL
ALT: 28 U/L (ref 0–44)
AST: 26 U/L (ref 15–41)
Albumin: 4.2 g/dL (ref 3.5–5.0)
Alkaline Phosphatase: 83 U/L (ref 38–126)
Anion gap: 13 (ref 5–15)
BUN: 15 mg/dL (ref 6–20)
CO2: 25 mmol/L (ref 22–32)
Calcium: 9.6 mg/dL (ref 8.9–10.3)
Chloride: 98 mmol/L (ref 98–111)
Creatinine, Ser: 1.03 mg/dL (ref 0.61–1.24)
GFR, Estimated: >60 mL/min (ref >60)
Glucose, Bld: 310 mg/dL — ABNORMAL HIGH (ref 70–99)
Potassium: 3.7 mmol/L (ref 3.5–5.1)
Sodium: 136 mmol/L (ref 135–145)
Total Bilirubin: 1.3 mg/dL — ABNORMAL HIGH (ref 0.3–1.2)
Total Protein: 7.2 g/dL (ref 6.5–8.1)

## 2022-01-04 LAB — BLOOD GAS, VENOUS
Acid-Base Excess: 8.8 mmol/L — ABNORMAL HIGH (ref 0.0–2.0)
Bicarbonate: 31.3 mmol/L — ABNORMAL HIGH (ref 20.0–28.0)
O2 Saturation: 72.3 %
Patient temperature: 37
pCO2, Ven: 35 mmHg — ABNORMAL LOW (ref 44–60)
pH, Ven: 7.56 — ABNORMAL HIGH (ref 7.25–7.43)
pO2, Ven: 41 mmHg (ref 32–45)

## 2022-01-04 LAB — CBC
HCT: 44.1 % (ref 39.0–52.0)
Hemoglobin: 15 g/dL (ref 13.0–17.0)
MCH: 29.2 pg (ref 26.0–34.0)
MCHC: 34 g/dL (ref 30.0–36.0)
MCV: 86 fL (ref 80.0–100.0)
Platelets: 232 10*3/uL (ref 150–400)
RBC: 5.13 MIL/uL (ref 4.22–5.81)
RDW: 13 % (ref 11.5–15.5)
WBC: 8.2 10*3/uL (ref 4.0–10.5)
nRBC: 0 % (ref 0.0–0.2)

## 2022-01-04 LAB — CBG MONITORING, ED
Glucose-Capillary: 308 mg/dL — ABNORMAL HIGH (ref 70–99)
Glucose-Capillary: 296 mg/dL — ABNORMAL HIGH (ref 70–99)
Glucose-Capillary: 217 mg/dL — ABNORMAL HIGH (ref 70–99)

## 2022-01-04 LAB — LIPASE, BLOOD: Lipase: 24 U/L (ref 11–51)

## 2022-01-04 LAB — BETA-HYDROXYBUTYRIC ACID: Beta-Hydroxybutyric Acid: 1.98 mmol/L — ABNORMAL HIGH (ref 0.05–0.27)

## 2022-01-04 MED ORDER — ONDANSETRON HCL 4 MG/2ML IJ SOLN
4.0000 mg | INTRAMUSCULAR | Status: AC
  Administered 2022-01-04: 4 mg via INTRAVENOUS
  Filled 2022-01-04: qty 2

## 2022-01-04 MED ORDER — SODIUM CHLORIDE 0.9 % IV BOLUS
1000.0000 mL | Freq: Once | INTRAVENOUS | Status: AC
  Administered 2022-01-04: 1000 mL via INTRAVENOUS

## 2022-01-04 MED ORDER — ONDANSETRON 4 MG PO TBDP: 4.0000 mg | ORAL_TABLET | Freq: Three times a day (TID) | ORAL | 0 refills | Status: DC | PRN

## 2022-01-04 MED ORDER — INSULIN ASPART 100 UNIT/ML IJ SOLN
5.0000 [IU] | Freq: Once | INTRAMUSCULAR | Status: AC
Start: 2022-01-04 — End: 2022-01-04
  Administered 2022-01-04: 5 [IU] via INTRAVENOUS
  Filled 2022-01-04: qty 1

## 2022-01-04 NOTE — ED Provider Notes (Signed)
? ?[facility] ?Provider Note ? ? ? Event Date/Time  ? First MD Initiated Contact with Patient 01/04/22 1148   ?  (approximate) ? ? ?History  ? ?Nausea ? ? ?HPI ? ?Wesley [name] is a 26 y.o. male who upon review of previous discharge summary from June 19, 2021 25 medical history significant of  DM Type 1 and prior DKA ? ?Patient reports he feels like he has DKA.  He was seen for nausea and vomiting in the ER about 2 days ago, and reports he is taking Zofran but despite this he continues to have nausea and vomiting.  He is not really having pain but reports it is nauseated whenever he tries to eat anything he vomits it back up.  He has not had a bowel movement recently but is not having abdominal pain either.  He reports that it is just more like severe nausea and vomiting.  He feels fatigued to the point that he is too tired to really stand up on his own gets lightheaded ? ?He is diabetic, he is continue to use his 70/30 and use that this morning ? ?No fevers or chills.  No chest pain no trouble breathing or cough.  Urinating frequent systole but no pain with urination ? ?Denies that he had any period of time that he has not been using his insulin, he has been remain compliant ? ?  ? ? ?Physical Exam  ? ?Triage Vital Signs: ?ED Triage Vitals  ?Enc Vitals Group  ?   BP 01/04/22 1141 (!) 136/99  ?   Pulse Rate 01/04/22 1141 81  ?   Resp 01/04/22 1141 (!) 26  ?   Temp 01/04/22 1141 98.1 ?F (36.7 ?C)  ?   Temp Source 01/04/22 1141 Oral  ?   SpO2 01/04/22 1141 100 %  ?   Weight 01/04/22 1133 180 lb (81.6 kg)  ?   Height 01/04/22 1133 5\' 9"  (1.753 m)  ?   Head Circumference --   ?   Peak Flow --   ?   Pain Score 01/04/22 1133 8  ?   Pain Loc --   ?   Pain Edu? --   ?   Excl. in [place]? --   ? ? ?Most recent vital signs: ?Vitals:  ? 01/04/22 1233 01/04/22 1400  ?BP: (!) 145/98 (!) 133/98  ?Pulse: 68 78  ?Resp: 18 18  ?Temp:    ?SpO2: 100% 96%  ? ? ? ?General: Awake, no distress.  Appears generally  fatigued but in no acute distress.  Appears moderately ill but it again not in distress ?CV:  Good peripheral perfusion.  Tachycardia, normal heart tones ?Resp:  Normal effort.  Clear lung sounds.  No noted tachypnea ?Abd:  No distention.  Abdomen soft nontender nondistended throughout. ?Other:  Lower extremities appear normal well-perfused, normal feet. ?Mucous membranes are moderately dry. ? ? ?ED Results / Procedures / Treatments  ? ?Labs ?(all labs ordered are listed, but only abnormal results are displayed) ?Labs Reviewed  ?COMPREHENSIVE METABOLIC PANEL - Abnormal; Notable for the following components:  ?    Result Value  ? Glucose, Bld 310 (*)   ? Total Bilirubin 1.3 (*)   ? All other components within normal limits  ?BLOOD GAS, VENOUS - Abnormal; Notable for the following components:  ? pH, Ven 7.56 (*)   ? pCO2, Ven 35 (*)   ? Bicarbonate 31.3 (*)   ? Acid-Base Excess 8.8 (*)   ?  All other components within normal limits  ?BETA-HYDROXYBUTYRIC ACID - Abnormal; Notable for the following components:  ? Beta-Hydroxybutyric Acid 1.98 (*)   ? All other components within normal limits  ?CBG MONITORING, ED - Abnormal; Notable for the following components:  ? Glucose-Capillary 308 (*)   ? All other components within normal limits  ?CBG MONITORING, ED - Abnormal; Notable for the following components:  ? Glucose-Capillary 296 (*)   ? All other components within normal limits  ?CBC  ?LIPASE, BLOOD  ?URINALYSIS, ROUTINE W REFLEX MICROSCOPIC  ?CBG MONITORING, ED  ? ? ? ?EKG ? ?Start time stamp reviewed inter by me at 1140 ?Heart rate 70 ?QRS 99 QTc 420 ?Normal sinus rhythm, early repolarization.  No evidence of ischemia ? ? ?RADIOLOGY ? ? ?Personally viewed and interpreted the patient's 2 view abdominal film, negative for obstructive pathology. ? ?PROCEDURES: ? ?Critical Care performed: No ? ?Procedures ? ? ?MEDICATIONS ORDERED IN ED: ?Medications  ?sodium chloride 0.9 % bolus 1,000 mL (0 mLs Intravenous Stopped 01/04/22  1330)  ?ondansetron (ZOFRAN) injection 4 mg (4 mg Intravenous Given 01/04/22 1243)  ?sodium chloride 0.9 % bolus 1,000 mL (1,000 mLs Intravenous New Bag/Given 01/04/22 1439)  ?ondansetron [facility]) injection 4 mg (4 mg Intravenous Given 01/04/22 1439)  ?insulin aspart (novoLOG) injection 5 Units (5 Units Intravenous Given 01/04/22 1457)  ? ? ? ?IMPRESSION / MDM / ASSESSMENT AND PLAN / ED COURSE  ?I reviewed the triage vital signs and the nursing notes. ?             ?               ? ?Differential diagnosis includes, but is not limited to, possible acute gastritis, gastroparesis, nausea vomiting diarrhea, acute gastrointestinal illness, unlikely be obstruction, also patient does report use of marijuana on a regular basis, cyclical or cannabinol induced vomiting is also strong consideration, abdominal exam soft nontender afebrile no elevated white count or fever.  Unlikely to represent acute infectious etiology.  Do not believe CT imaging would be warranted at this time given the very reassuring abdominal examination. ? ?The patient is on the cardiac monitor to evaluate for evidence of arrhythmia and/or significant heart rate changes. ? ?Personally reviewed interpreted patient's labs, glucose 310.  Consistent with known diabetes.  Minimally elevated T. bili 1.3.  CBC normal.  Minimally elevated beta hydroxybutyrate.  The patient however does not have evidence of acidosis on VBG and has a normal anion gap.  I do not see evidence of acute DKA at this time ?----------------------------------------- ?2:56 PM on 01/04/2022 ?----------------------------------------- ?Nausea has improved.  Patient now eating crackers and has been able to sip water and tea without nausea or vomiting.  He will continue to trial p.o., glucose rechecked and elevated, patient reports typically utilize 5 units of aspart sliding scale.  I have ordered this, and we will recheck glucose in about 1 hour. ? ?Ongoing care assigned to the oncoming attending  Dr. Kerman Passey with the plan to follow-up on repeat blood sugar and reassessment of the patient/p.o. challenge.  Patient feeling well, no further vomiting and nausea controlled blood sugar improving would anticipate discharge with recommendation for close outpatient follow-up.  If patient has ongoing nausea and vomiting or worsening symptoms otology, would consider admission for further work-up or observation. ? ?  ? ? ?FINAL CLINICAL IMPRESSION(S) / ED DIAGNOSES  ? ?Final diagnoses:  ?Nausea and vomiting, unspecified vomiting type  ?Hyperglycemia due to type 1 diabetes mellitus ([place])  ? ? ? ?  Rx / DC Orders  ? ?ED Discharge Orders   ? ? None  ? ?  ? ? ? ?Note:  This document was prepared using Dragon voice recognition software and may include unintentional dictation errors. ?  Delman Kitten, MD ?01/04/22 1536 ? ?

## 2022-01-04 NOTE — ED Triage Notes (Signed)
Pt via POV from home. Pt c/o nausea and SOB. Pt is type I DM, pt was seen and discharged couple of days ago and was told he was not in DKA. States the nausea is getting worse. Pt is A&OX4 and NAD ?

## 2022-01-04 NOTE — ED Provider Notes (Signed)
----------------------------------------- ?  5:00 PM on 01/04/2022 ?----------------------------------------- ?Patient's blood sugars decreased to 217.  Patient has been able to eat and drink with no issues.  Suspect the slight alkalosis on his VBG was a result of nausea and vomiting.  As the nausea vomiting is now controlled patient is able to tolerate p.o. and lab work was otherwise reassuring with a normal anion gap, no acidosis, I believe the patient would be safe for discharge home with PCP follow-up.  We will prescribe nausea medication for the patient.  Patient agreeable. ?  ?Minna Antis, MD ?01/04/22 1700 ? ?

## 2022-01-10 ENCOUNTER — Telehealth: Payer: Self-pay

## 2022-01-10 NOTE — Telephone Encounter (Signed)
CALLED PATIENT NO ANSWER LEFT VOICEMAIL FOR A CALL BACK LETTER SENT 

## 2022-01-10 NOTE — Telephone Encounter (Signed)
LETTER SENT

## 2022-04-05 ENCOUNTER — Emergency Department
Admission: EM | Admit: 2022-04-05 | Discharge: 2022-04-05 | Disposition: A | Discharge status: Home / Self Care | Payer: Self-pay | Attending: Emergency Medicine | Admitting: Emergency Medicine

## 2022-04-05 ENCOUNTER — Emergency Department: Payer: Medicaid Other

## 2022-04-05 ENCOUNTER — Other Ambulatory Visit: Payer: Self-pay

## 2022-04-05 DIAGNOSIS — M545 Low back pain, unspecified: Secondary | ICD-10-CM | POA: Insufficient documentation

## 2022-04-05 DIAGNOSIS — E1065 Type 1 diabetes mellitus with hyperglycemia: Secondary | ICD-10-CM | POA: Insufficient documentation

## 2022-04-05 DIAGNOSIS — R112 Nausea with vomiting, unspecified: Secondary | ICD-10-CM

## 2022-04-05 DIAGNOSIS — E860 Dehydration: Secondary | ICD-10-CM | POA: Insufficient documentation

## 2022-04-05 DIAGNOSIS — R739 Hyperglycemia, unspecified: Secondary | ICD-10-CM

## 2022-04-05 LAB — CBC WITH DIFFERENTIAL/PLATELET
Abs Immature Granulocytes: 0.02 10*3/uL (ref 0.00–0.07)
Basophils Absolute: 0.1 10*3/uL (ref 0.0–0.1)
Basophils Relative: 1 %
Eosinophils Absolute: 0 10*3/uL (ref 0.0–0.5)
Eosinophils Relative: 0 %
HCT: 49 % (ref 39.0–52.0)
Hemoglobin: 16.4 g/dL (ref 13.0–17.0)
Immature Granulocytes: 0 %
Lymphocytes Relative: 31 %
Lymphs Abs: 2.3 10*3/uL (ref 0.7–4.0)
MCH: 29.1 pg (ref 26.0–34.0)
MCHC: 33.5 g/dL (ref 30.0–36.0)
MCV: 86.9 fL (ref 80.0–100.0)
Monocytes Absolute: 0.6 10*3/uL (ref 0.1–1.0)
Monocytes Relative: 7 %
Neutro Abs: 4.6 10*3/uL (ref 1.7–7.7)
Neutrophils Relative %: 61 %
Platelets: 242 10*3/uL (ref 150–400)
RBC: 5.64 MIL/uL (ref 4.22–5.81)
RDW: 13.5 % (ref 11.5–15.5)
WBC: 7.5 10*3/uL (ref 4.0–10.5)
nRBC: 0 % (ref 0.0–0.2)

## 2022-04-05 LAB — HEMOGLOBIN A1C
Hgb A1c MFr Bld: 10 % — ABNORMAL HIGH (ref 4.8–5.6)
Mean Plasma Glucose: 240.3 mg/dL

## 2022-04-05 LAB — COMPREHENSIVE METABOLIC PANEL
ALT: 27 U/L (ref 0–44)
AST: 26 U/L (ref 15–41)
Albumin: 4.2 g/dL (ref 3.5–5.0)
Alkaline Phosphatase: 85 U/L (ref 38–126)
Anion gap: 13 (ref 5–15)
BUN: 19 mg/dL (ref 6–20)
CO2: 21 mmol/L — ABNORMAL LOW (ref 22–32)
Calcium: 9.5 mg/dL (ref 8.9–10.3)
Chloride: 98 mmol/L (ref 98–111)
Creatinine, Ser: 1.17 mg/dL (ref 0.61–1.24)
GFR, Estimated: >60 mL/min (ref >60)
Glucose, Bld: 389 mg/dL — ABNORMAL HIGH (ref 70–99)
Potassium: 4.8 mmol/L (ref 3.5–5.1)
Sodium: 132 mmol/L — ABNORMAL LOW (ref 135–145)
Total Bilirubin: 1.9 mg/dL — ABNORMAL HIGH (ref 0.3–1.2)
Total Protein: 7.3 g/dL (ref 6.5–8.1)

## 2022-04-05 LAB — BETA-HYDROXYBUTYRIC ACID: Beta-Hydroxybutyric Acid: 3.26 mmol/L — ABNORMAL HIGH (ref 0.05–0.27)

## 2022-04-05 LAB — CBG MONITORING, ED
Glucose-Capillary: 374 mg/dL — ABNORMAL HIGH (ref 70–99)
Glucose-Capillary: 385 mg/dL — ABNORMAL HIGH (ref 70–99)
Glucose-Capillary: 237 mg/dL — ABNORMAL HIGH (ref 70–99)

## 2022-04-05 LAB — BLOOD GAS, VENOUS
Acid-Base Excess: 3.7 mmol/L — ABNORMAL HIGH (ref 0.0–2.0)
Bicarbonate: 26.1 mmol/L (ref 20.0–28.0)
O2 Saturation: 47 %
Patient temperature: 37
pCO2, Ven: 32 mmHg — ABNORMAL LOW (ref 44–60)
pH, Ven: 7.52 — ABNORMAL HIGH (ref 7.25–7.43)
pO2, Ven: <31 mmHg — CL (ref 32–45)

## 2022-04-05 LAB — MAGNESIUM: Magnesium: 1.9 mg/dL (ref 1.7–2.4)

## 2022-04-05 LAB — LIPASE, BLOOD: Lipase: 22 U/L (ref 11–51)

## 2022-04-05 MED ORDER — LIDOCAINE 5 % EX PTCH
1.0000 | MEDICATED_PATCH | CUTANEOUS | Status: DC
  Administered 2022-04-05: 1 via TRANSDERMAL
  Filled 2022-04-05: qty 1

## 2022-04-05 MED ORDER — ACETAMINOPHEN 500 MG PO TABS
1000.0000 mg | ORAL_TABLET | Freq: Once | ORAL | Status: AC
  Administered 2022-04-05: 1000 mg via ORAL
  Filled 2022-04-05: qty 2

## 2022-04-05 MED ORDER — INSULIN ASPART PROT & ASPART (70-30 MIX) 100 UNIT/ML ~~LOC~~ SUSP
23.0000 [IU] | SUBCUTANEOUS | Status: AC
  Administered 2022-04-05: 23 [IU] via SUBCUTANEOUS
  Filled 2022-04-05: qty 10

## 2022-04-05 MED ORDER — LACTATED RINGERS IV BOLUS
1000.0000 mL | Freq: Once | INTRAVENOUS | Status: AC
  Administered 2022-04-05: 1000 mL via INTRAVENOUS

## 2022-04-05 MED ORDER — BLOOD GLUCOSE MONITOR KIT: PACK | 0 refills | Status: DC

## 2022-04-05 MED ORDER — KETOROLAC TROMETHAMINE 30 MG/ML IJ SOLN: 15.0000 mg | Freq: Once | INTRAMUSCULAR | Status: DC

## 2022-04-05 MED ORDER — INSULIN NPH ISOPHANE & REGULAR (70-30) 100 UNIT/ML ~~LOC~~ SUSP: 23.0000 [IU] | Freq: Two times a day (BID) | SUBCUTANEOUS | 0 refills | Status: DC

## 2022-04-05 MED ORDER — LACTATED RINGERS IV BOLUS: 1000.0000 mL | Freq: Once | INTRAVENOUS | Status: DC

## 2022-04-05 MED ORDER — INSULIN ASPART 100 UNIT/ML IJ SOLN
0.0000 [IU] | INTRAMUSCULAR | Status: DC
  Administered 2022-04-05: 15 [IU] via SUBCUTANEOUS
  Filled 2022-04-05: qty 1

## 2022-04-05 MED ORDER — INSULIN REGULAR HUMAN 100 UNIT/ML IJ SOLN
1.0000 [IU] | Freq: Three times a day (TID) | INTRAMUSCULAR | 0 refills | Status: DC
Start: 2022-04-05 — End: 2023-04-15

## 2022-04-05 MED ORDER — ONETOUCH ULTRASOFT LANCETS MISC: 0 refills | Status: DC

## 2022-04-05 MED ORDER — INSULIN SYRINGES (DISPOSABLE) U-100 0.5 ML MISC: 40.0000 [IU] | Freq: Three times a day (TID) | 0 refills | Status: DC

## 2022-04-05 MED ORDER — ONDANSETRON 4 MG PO TBDP: 4.0000 mg | ORAL_TABLET | Freq: Three times a day (TID) | ORAL | 0 refills | Status: DC | PRN

## 2022-04-05 MED ORDER — GLUCOSE BLOOD VI STRP: ORAL_STRIP | 0 refills | Status: DC

## 2022-04-05 NOTE — ED Provider Notes (Signed)
[facility] Provider Note    Event Date/Time   First MD Initiated Contact with Patient 04/05/22 1255     (approximate)   History   Hyperglycemia   HPI  Wesley [name] is a 26 y.o. male with medical history significant of  DM Type 1 who presents to the emergency room for evaluation of generalized weakness and some low back pain.  Patient states 3 days ago he felt a "crick in his neck" and took an unspecified muscle relaxant from a family member.  He states he subsequently blacked out in the shower and thinks he fell on his lower back he had some lower back pain since then.  He does not think he hit his head and has no headache, no neck pain, upper back pain or any extremity pain or focal weakness.  States that 2 days ago he had some nausea and vomiting but has not had any since then.  States he overall just feels very weak.  He has been taking his insulin up until last night.  He did not have much of an appetite yesterday.  Has not had any insulin or oral intake today.  He denies any chest pain, cough, fevers, shortness of breath, abdominal pain, urinary symptoms, diarrhea or any current nausea.  No rashes.  He denies any recent tobacco abuse, EtOH use or illicit drug use.      Physical Exam  Triage Vital Signs: ED Triage Vitals  Enc Vitals Group     BP      Pulse      Resp      Temp      Temp src      SpO2      Weight      Height      Head Circumference      Peak Flow      Pain Score      Pain Loc      Pain Edu?      Excl. in GC?     Most recent vital signs: Vitals:   04/05/22 1500 04/05/22 1506  BP: (!) 132/93   Pulse: 98 92  Resp: (!) 26 14  Temp:    SpO2: 98% 99%    General: Awake, no distress.  CV:  Good peripheral perfusion.  2+ bilateral radial pulses Resp:  Normal effort.  Clear bilaterally.  Slightly tachypneic Abd:  No distention.  Soft. Other:  Mild tenderness along the L-spine but none over the T or C-spine.  Patient is  moving all extremities spontaneously.  Sensation is intact to light touch throughout.   ED Results / Procedures / Treatments  Labs (all labs ordered are listed, but only abnormal results are displayed) Labs Reviewed  COMPREHENSIVE METABOLIC PANEL - Abnormal; Notable for the following components:      Result Value   Sodium 132 (*)    CO2 21 (*)    Glucose, Bld 389 (*)    Total Bilirubin 1.9 (*)    All other components within normal limits  BETA-HYDROXYBUTYRIC ACID - Abnormal; Notable for the following components:   Beta-Hydroxybutyric Acid 3.26 (*)    All other components within normal limits  BLOOD GAS, VENOUS - Abnormal; Notable for the following components:   pH, Ven 7.52 (*)    pCO2, Ven 32 (*)    pO2, Ven <31 (*)    Acid-Base Excess 3.7 (*)    All other components within normal limits  CBG MONITORING, ED -  Abnormal; Notable for the following components:   Glucose-Capillary 374 (*)    All other components within normal limits  CBG MONITORING, ED - Abnormal; Notable for the following components:   Glucose-Capillary 385 (*)    All other components within normal limits  CBC WITH DIFFERENTIAL/PLATELET  MAGNESIUM  LIPASE, BLOOD  URINALYSIS, COMPLETE (UACMP) WITH MICROSCOPIC  HEMOGLOBIN A1C     EKG  ECG is markable for sinus rhythm with nonspecific ST change in aVL without other clear evidence of acute ischemia or significant arrhythmia.  Normal axis and intervals.  This ECG is compared to that obtained on 01/04/2022 at the site of similar change in aVL without clear new acute changes today.   RADIOLOGY    PROCEDURES:  Critical Care performed: No  .1-3 Lead EKG Interpretation  Performed by: Gilles Chiquito, MD Authorized by: Gilles Chiquito, MD     Interpretation: normal     ECG rate assessment: normal     Rhythm: sinus rhythm     Ectopy: none     Conduction: normal     The patient is on the cardiac monitor to evaluate for evidence of arrhythmia and/or  significant heart rate changes.   MEDICATIONS ORDERED IN ED: Medications  lidocaine (LIDODERM) 5 % 1 patch (1 patch Transdermal Patch Applied 04/05/22 1400)  insulin aspart (novoLOG) injection 0-15 Units (15 Units Subcutaneous Given 04/05/22 1405)  lactated ringers bolus 1,000 mL (has no administration in time range)  ketorolac (TORADOL) 30 MG/ML injection 15 mg (has no administration in time range)  insulin aspart protamine- aspart (NOVOLOG MIX 70/30) injection 23 Units (has no administration in time range)  acetaminophen (TYLENOL) tablet 1,000 mg (1,000 mg Oral Given 04/05/22 1400)  lactated ringers bolus 1,000 mL (0 mLs Intravenous Stopped 04/05/22 1354)     IMPRESSION / MDM / ASSESSMENT AND PLAN / ED COURSE  I reviewed the triage vital signs and the nursing notes. Patient's presentation is most consistent with acute presentation with potential threat to life or bodily function.                               Differential diagnosis includes, but is not limited to DKA, HHS, dehydration and metabolic derangements including kidney injury and electrolyte derangements.  With regard to patient's apparent syncopal episode after taking a muscle relaxant 3 days ago suspect this may be from taking muscle relaxant possibly in the setting of dehydration at that time.  There is no exam findings at this time to suggest a CVA and is denying any other acute complaints at this time low back pain and I have low suspicion for occult intracranial hemorrhage.  He does have some mild lower back tenderness without skin changes overlying.  Discussed obtaining x-ray to assess for possible fracture although patient is refusing this stating understands risks and stated he thinks he just got some bruises.  I think this is reasonable as he is making informed choice.   ECG is markable for sinus rhythm with nonspecific ST change in aVL without other clear evidence of acute ischemia or significant arrhythmia.  Normal axis and  intervals.  This ECG is compared to that obtained on 01/04/2022 at the site of similar change in aVL without clear new acute changes today.  VBG with a pH of 7.52 with a PCO2 of 32 and bicarb of 26.1.  This is not suggestive of a primary metabolic acidosis.  CBC without  leukocytosis or acute anemia.  BMP is remarkable for bicarb 21, glucose of 389 slightly elevated T. bili at 1.9 anion gap of 13.  This is not consistent with HHS or DKA.Marland Kitchen  Beta hydroxybutyrate is 3.26 consistent with some starvation ketosis and includes insulin resistance.  Magnesium is 1.9.  Lipase is 22 and ALT suggestive of pancreatitis.  On reassessment patient states he is feeling much better after Tylenol, Toradol, and lidocaine patch in his lower back.  He is able to tolerate p.o. without difficulty.  As he has not had any insulin this morning he was given a dose of his long-acting and sliding scale.  Discussed importance of staying hydrated with nonsugar beverages and taking his insulin as directed and keeping close eye on his sugars.  He is requesting refills of his chronic medications which were provided.  Advised to have blood pressure rechecked at his follow-up visit with PCP.  Discussed returning for any new or worsening symptoms.  Discharged in stable condition.  Strict return precautions advised and discussed.    FINAL CLINICAL IMPRESSION(S) / ED DIAGNOSES   Final diagnoses:  Nausea and vomiting, unspecified vomiting type  Hyperglycemia  Acute midline low back pain without sciatica  Dehydration    Rx / DC Orders   ED Discharge Orders          Ordered    blood glucose meter kit and supplies KIT        04/05/22 1502    glucose blood test strip        04/05/22 1503    Lancets (ONETOUCH ULTRASOFT) lancets        04/05/22 1503    insulin NPH-regular Human (70-30) 100 UNIT/ML injection  2 times daily with meals        04/05/22 1503    insulin regular (NOVOLIN R) 100 units/mL injection  3 times daily before  meals        04/05/22 1503    ondansetron (ZOFRAN-ODT) 4 MG disintegrating tablet  Every 8 hours PRN        04/05/22 1503    Insulin Syringes, Disposable, U-100 0.5 ML MISC  3 times daily        04/05/22 1503             Note:  This document was prepared using Dragon voice recognition software and may include unintentional dictation errors.   Gilles Chiquito, MD 04/05/22 [phone]

## 2022-05-18 ENCOUNTER — Encounter: Payer: Self-pay | Admitting: Radiology

## 2022-05-18 ENCOUNTER — Emergency Department: Payer: Self-pay

## 2022-05-18 ENCOUNTER — Emergency Department
Admission: EM | Admit: 2022-05-18 | Discharge: 2022-05-19 | Discharge status: Against Medical Advice | Payer: Self-pay | Attending: Emergency Medicine | Admitting: Emergency Medicine

## 2022-05-18 ENCOUNTER — Other Ambulatory Visit: Payer: Self-pay

## 2022-05-18 DIAGNOSIS — E860 Dehydration: Secondary | ICD-10-CM | POA: Insufficient documentation

## 2022-05-18 DIAGNOSIS — R630 Anorexia: Secondary | ICD-10-CM | POA: Insufficient documentation

## 2022-05-18 DIAGNOSIS — Z20822 Contact with and (suspected) exposure to covid-19: Secondary | ICD-10-CM | POA: Insufficient documentation

## 2022-05-18 DIAGNOSIS — R531 Weakness: Secondary | ICD-10-CM | POA: Insufficient documentation

## 2022-05-18 DIAGNOSIS — R5383 Other fatigue: Secondary | ICD-10-CM | POA: Insufficient documentation

## 2022-05-18 DIAGNOSIS — R0602 Shortness of breath: Secondary | ICD-10-CM | POA: Insufficient documentation

## 2022-05-18 DIAGNOSIS — R112 Nausea with vomiting, unspecified: Secondary | ICD-10-CM | POA: Insufficient documentation

## 2022-05-18 LAB — CBC WITH DIFFERENTIAL/PLATELET
Abs Immature Granulocytes: 0.01 10*3/uL (ref 0.00–0.07)
Basophils Absolute: 0 10*3/uL (ref 0.0–0.1)
Basophils Relative: 0 %
Eosinophils Absolute: 0 10*3/uL (ref 0.0–0.5)
Eosinophils Relative: 0 %
HCT: 53 % — ABNORMAL HIGH (ref 39.0–52.0)
Hemoglobin: 17.8 g/dL — ABNORMAL HIGH (ref 13.0–17.0)
Immature Granulocytes: 0 %
Lymphocytes Relative: 30 %
Lymphs Abs: 2.4 10*3/uL (ref 0.7–4.0)
MCH: 28.8 pg (ref 26.0–34.0)
MCHC: 33.6 g/dL (ref 30.0–36.0)
MCV: 85.9 fL (ref 80.0–100.0)
Monocytes Absolute: 0.5 10*3/uL (ref 0.1–1.0)
Monocytes Relative: 7 %
Neutro Abs: 4.9 10*3/uL (ref 1.7–7.7)
Neutrophils Relative %: 63 %
Platelets: 314 10*3/uL (ref 150–400)
RBC: 6.17 MIL/uL — ABNORMAL HIGH (ref 4.22–5.81)
RDW: 13.5 % (ref 11.5–15.5)
WBC: 7.9 10*3/uL (ref 4.0–10.5)
nRBC: 0 % (ref 0.0–0.2)

## 2022-05-18 LAB — COMPREHENSIVE METABOLIC PANEL
ALT: 25 U/L (ref 0–44)
AST: 26 U/L (ref 15–41)
Albumin: 4.9 g/dL (ref 3.5–5.0)
Alkaline Phosphatase: 99 U/L (ref 38–126)
Anion gap: 16 — ABNORMAL HIGH (ref 5–15)
BUN: 19 mg/dL (ref 6–20)
CO2: 24 mmol/L (ref 22–32)
Calcium: 10.2 mg/dL (ref 8.9–10.3)
Chloride: 101 mmol/L (ref 98–111)
Creatinine, Ser: 1.26 mg/dL — ABNORMAL HIGH (ref 0.61–1.24)
GFR, Estimated: >60 mL/min (ref >60)
Glucose, Bld: 105 mg/dL — ABNORMAL HIGH (ref 70–99)
Potassium: 3.3 mmol/L — ABNORMAL LOW (ref 3.5–5.1)
Sodium: 141 mmol/L (ref 135–145)
Total Bilirubin: 1.1 mg/dL (ref 0.3–1.2)
Total Protein: 8.6 g/dL — ABNORMAL HIGH (ref 6.5–8.1)

## 2022-05-18 LAB — BETA-HYDROXYBUTYRIC ACID: Beta-Hydroxybutyric Acid: 1.41 mmol/L — ABNORMAL HIGH (ref 0.05–0.27)

## 2022-05-18 LAB — LACTIC ACID, PLASMA: Lactic Acid, Venous: 2.3 mmol/L (ref 0.5–1.9)

## 2022-05-18 LAB — BLOOD GAS, VENOUS
Acid-base deficit: 2.8 mmol/L — ABNORMAL HIGH (ref 0.0–2.0)
Bicarbonate: 18.4 mmol/L — ABNORMAL LOW (ref 20.0–28.0)
O2 Saturation: 74.6 %
Patient temperature: 37
pCO2, Ven: 23 mmHg — ABNORMAL LOW (ref 44–60)
pH, Ven: 7.51 — ABNORMAL HIGH (ref 7.25–7.43)
pO2, Ven: 41 mmHg (ref 32–45)

## 2022-05-18 LAB — TROPONIN I (HIGH SENSITIVITY): Troponin I (High Sensitivity): 11 ng/L (ref <18)

## 2022-05-18 LAB — SARS CORONAVIRUS 2 BY RT PCR: SARS Coronavirus 2 by RT PCR: NEGATIVE

## 2022-05-18 LAB — CBG MONITORING, ED: Glucose-Capillary: 112 mg/dL — ABNORMAL HIGH (ref 70–99)

## 2022-05-18 MED ORDER — ONDANSETRON HCL 4 MG/2ML IJ SOLN: 4.0000 mg | Freq: Once | INTRAMUSCULAR | Status: DC

## 2022-05-18 MED ORDER — POTASSIUM CHLORIDE 10 MEQ/100ML IV SOLN
10.0000 meq | INTRAVENOUS | Status: DC
  Administered 2022-05-18 (×2): 10 meq via INTRAVENOUS
  Filled 2022-05-18 (×2): qty 100

## 2022-05-18 MED ORDER — SODIUM CHLORIDE 0.9 % IV BOLUS
1000.0000 mL | Freq: Once | INTRAVENOUS | Status: AC
  Administered 2022-05-18: 1000 mL via INTRAVENOUS

## 2022-05-18 MED ORDER — DROPERIDOL 2.5 MG/ML IJ SOLN
2.5000 mg | Freq: Once | INTRAMUSCULAR | Status: AC
  Administered 2022-05-18: 2.5 mg via INTRAVENOUS
  Filled 2022-05-18: qty 2

## 2022-05-18 NOTE — ED Provider Notes (Signed)
[facility] Provider Note    Event Date/Time   First MD Initiated Contact with Patient 05/18/22 2123     (approximate)   History   Generalized Weakness   HPI  Wesley [name] is a 26 y.o. male  here with generalized weakness. Pt has a long h/o T1DM, h/o chronic issues with glucose control, here with nausea, generalized fatigue. Pt states that over the past 2 days, he has noticed increasing fatigue, nausea, loss of appetite.  He has a history of recurrent DKA.  He reportedly does not have a glucose monitor because he cannot afford it.  He gets himself scheduled 7030 twice a day, and just adjust his insulin based on how he feels.  He states that he has felt worse for the last 2 days.  He is felt mildly short of breath.  Felt generally fatigued.  He is felt nauseous but not vomited.  No fevers or chills.  No significant abdominal pain.  No other complaints.      Physical Exam   Triage Vital Signs: ED Triage Vitals  Enc Vitals Group     BP 05/18/22 2059 (!) 122/102     Pulse Rate 05/18/22 2059 (!) 130     Resp 05/18/22 2059 (!) 24     Temp 05/18/22 2059 99.2 F (37.3 C)     Temp Source 05/18/22 2059 Oral     SpO2 05/18/22 2059 100 %     Weight 05/18/22 2102 180 lb (81.6 kg)     Height 05/18/22 2102 5\' 10"  (1.778 m)     Head Circumference --      Peak Flow --      Pain Score 05/18/22 2102 0     Pain Loc --      Pain Edu? --      Excl. in GC? --     Most recent vital signs: Vitals:   05/18/22 2241 05/18/22 2300  BP: (!) 135/92 122/79  Pulse: 98 96  Resp: (!) 22 19  Temp:    SpO2: 98% 98%     General: Awake, no distress.  CV:  Good peripheral perfusion.  Regular rate and rhythm.  No murmurs. Resp:  Normal effort.  Slight increased respiratory rate.  Lungs clear. Abd:  No distention.  No significant tenderness Other:  Dry mucous membranes.   ED Results / Procedures / Treatments   Labs (all labs ordered are listed, but only abnormal  results are displayed) Labs Reviewed  CBC WITH DIFFERENTIAL/PLATELET - Abnormal; Notable for the following components:      Result Value   RBC 6.17 (*)    Hemoglobin 17.8 (*)    HCT 53.0 (*)    All other components within normal limits  COMPREHENSIVE METABOLIC PANEL - Abnormal; Notable for the following components:   Potassium 3.3 (*)    Glucose, Bld 105 (*)    Creatinine, Ser 1.26 (*)    Total Protein 8.6 (*)    Anion gap 16 (*)    All other components within normal limits  LACTIC ACID, PLASMA - Abnormal; Notable for the following components:   Lactic Acid, Venous 2.3 (*)    All other components within normal limits  BETA-HYDROXYBUTYRIC ACID - Abnormal; Notable for the following components:   Beta-Hydroxybutyric Acid 1.41 (*)    All other components within normal limits  BLOOD GAS, VENOUS - Abnormal; Notable for the following components:   pH, Ven 7.51 (*)    pCO2, Ven 23 (*)  Bicarbonate 18.4 (*)    Acid-base deficit 2.8 (*)    All other components within normal limits  CBG MONITORING, ED - Abnormal; Notable for the following components:   Glucose-Capillary 112 (*)    All other components within normal limits  SARS CORONAVIRUS 2 BY RT PCR  LACTIC ACID, PLASMA  URINALYSIS, ROUTINE W REFLEX MICROSCOPIC  URINE DRUG SCREEN, QUALITATIVE ([facility] ONLY)  CBG MONITORING, ED  TROPONIN I (HIGH SENSITIVITY)     EKG    RADIOLOGY Chest x-ray: No acute process   I also independently reviewed and agree with radiologist interpretations.   PROCEDURES:  Critical Care performed: No   MEDICATIONS ORDERED IN ED: Medications  potassium chloride 10 mEq in 100 mL IVPB (10 mEq Intravenous New Bag/Given 05/18/22 2247)  sodium chloride 0.9 % bolus 1,000 mL (1,000 mLs Intravenous New Bag/Given 05/18/22 2147)  sodium chloride 0.9 % bolus 1,000 mL (1,000 mLs Intravenous New Bag/Given 05/18/22 2247)  droperidol (INAPSINE) 2.5 MG/ML injection 2.5 mg (2.5 mg Intravenous Given 05/18/22 2254)      IMPRESSION / MDM / ASSESSMENT AND PLAN / ED COURSE  I reviewed the triage vital signs and the nursing notes.                               The patient is on the cardiac monitor to evaluate for evidence of arrhythmia and/or significant heart rate changes.   Ddx:  Differential includes the following, with pertinent life- or limb-threatening emergencies considered:  DKA, cannabis hyperemesis, occult infection such as UTI, food-borne illness, gastritis, PUD  Patient's presentation is most consistent with acute presentation with potential threat to life or bodily function.  MDM:  26 yo M with h/o T1DM here with nausea, general fatigue. Primary suspicion is possible DKA vs cannabis hyperemesis vs gastritis. Pt nontoxic, mentating well. Dehydrated clinically. Will start IVF. CBC without leukocytosis. CMP remarkable for mild hypokalemia, Cr elevation. AG 16 but CO2 24 and pH 7.5, do not clinically suspect DKA. LA 2.3 likely from dehydration. BHB 1.41 which is actually below his baseline. Trop neg, doubt ischemia. Abdomen is soft, NT, ND, without focal findings to suggest cholecystitis, appendicitis, or need for CT imaging.   Plan to repeat LA, monitor. Suspect he can be managed as outpt if sx improve and glucose remains stable. Reiterated the importance that pt need a glucose monitor. He has had social work involvement in the past.   MEDICATIONS GIVEN IN ED: Medications  potassium chloride 10 mEq in 100 mL IVPB (10 mEq Intravenous New Bag/Given 05/18/22 2247)  sodium chloride 0.9 % bolus 1,000 mL (1,000 mLs Intravenous New Bag/Given 05/18/22 2147)  sodium chloride 0.9 % bolus 1,000 mL (1,000 mLs Intravenous New Bag/Given 05/18/22 2247)  droperidol (INAPSINE) 2.5 MG/ML injection 2.5 mg (2.5 mg Intravenous Given 05/18/22 2254)     Consults:  None   EMR reviewed Prior ED visits     FINAL CLINICAL IMPRESSION(S) / ED DIAGNOSES   Final diagnoses:  Dehydration  Nausea and vomiting,  unspecified vomiting type     Rx / DC Orders   ED Discharge Orders     None        Note:  This document was prepared using Dragon voice recognition software and may include unintentional dictation errors.   Shaune Pollack, MD 05/18/22 [phone]

## 2022-05-18 NOTE — ED Triage Notes (Addendum)
Patient reports history of type I DM. Reports generalized weakness and nausea x 2 days. Denies abdominal pain, fever or chills. AOX4. Resp even, unlabored on RA. States he does not have home glucometer to check CBG's, reports it broke a few month ago. Reports he has been dosing insulin based on how he feels.

## 2022-05-18 NOTE — ED Notes (Signed)
Pt denies urge to urinate at this time. Urinal placed at bedside. Pt expressing desire to leave but agrees to stay for IV potassium and fluid administration at this time.

## 2022-05-19 NOTE — ED Notes (Signed)
Pt visualized walking out of ED, this RN checked room where patient was, 1st RN notified of patient elopement with IV in place. First RN Marchelle Folks stopped patient prior to leaving ED and successfully removed patient's IV. EDP Isaacs and EDP Dolores Frame made aware of patient's elopement.

## 2022-05-19 NOTE — ED Provider Notes (Signed)
-----------------------------------------   12:21 AM on 05/19/2022 -----------------------------------------   Patient was caught eloping prior to completion of evaluation.  His IV was removed.  He did not desire to stay for remainder of medications and repeat blood work.   Irean Hong, MD 05/19/22 Rich Fuchs

## 2022-08-08 LAB — GC/CHLAMYDIA PROBE AMP
Chlamydia trachomatis, NAA: NEGATIVE
Neisseria Gonorrhoeae by PCR: POSITIVE — AB

## 2022-09-14 IMAGING — DX DG ABDOMEN 2V
3 series · 3 of 3 positions shown · non-contrast
Comparison: None.

CLINICAL DATA: Vomiting

EXAM:
ABDOMEN - 2 VIEW

[abdomen erect]
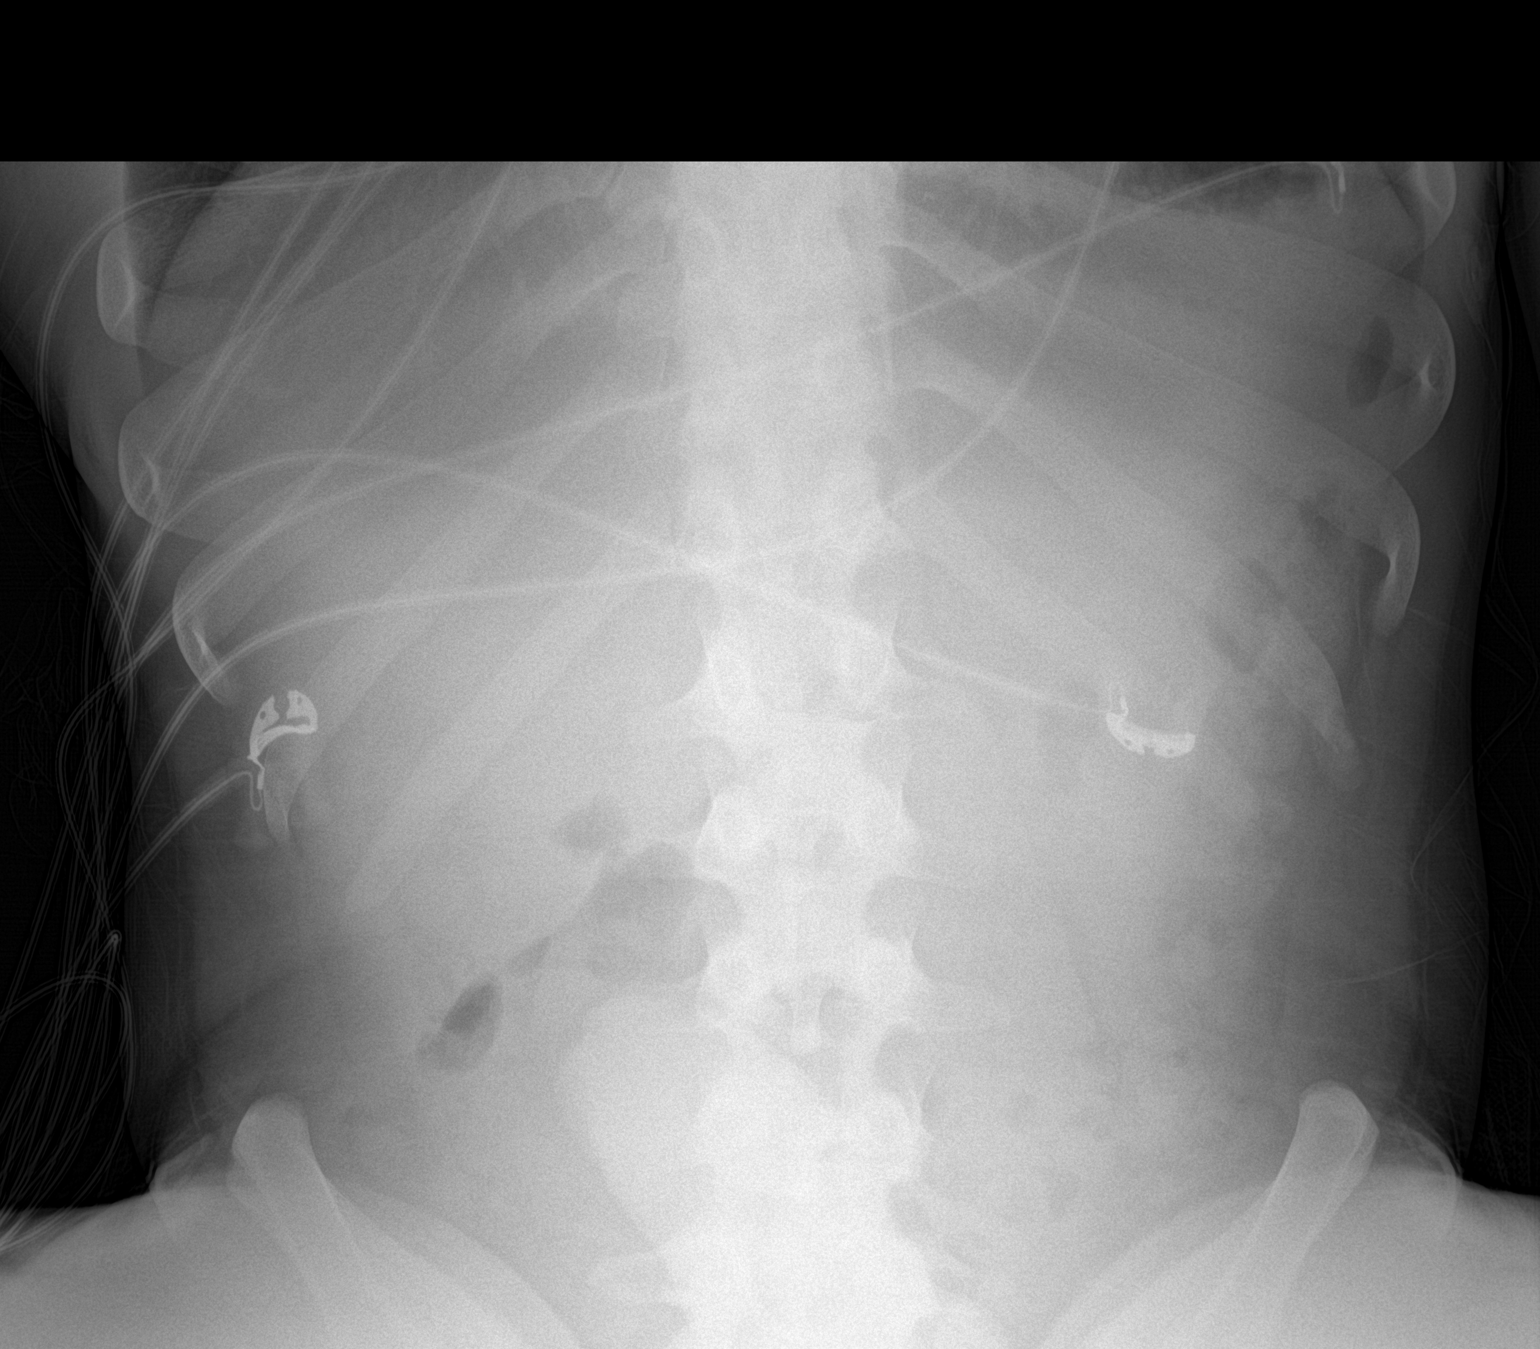

[abdomen supine (1 of 2)]
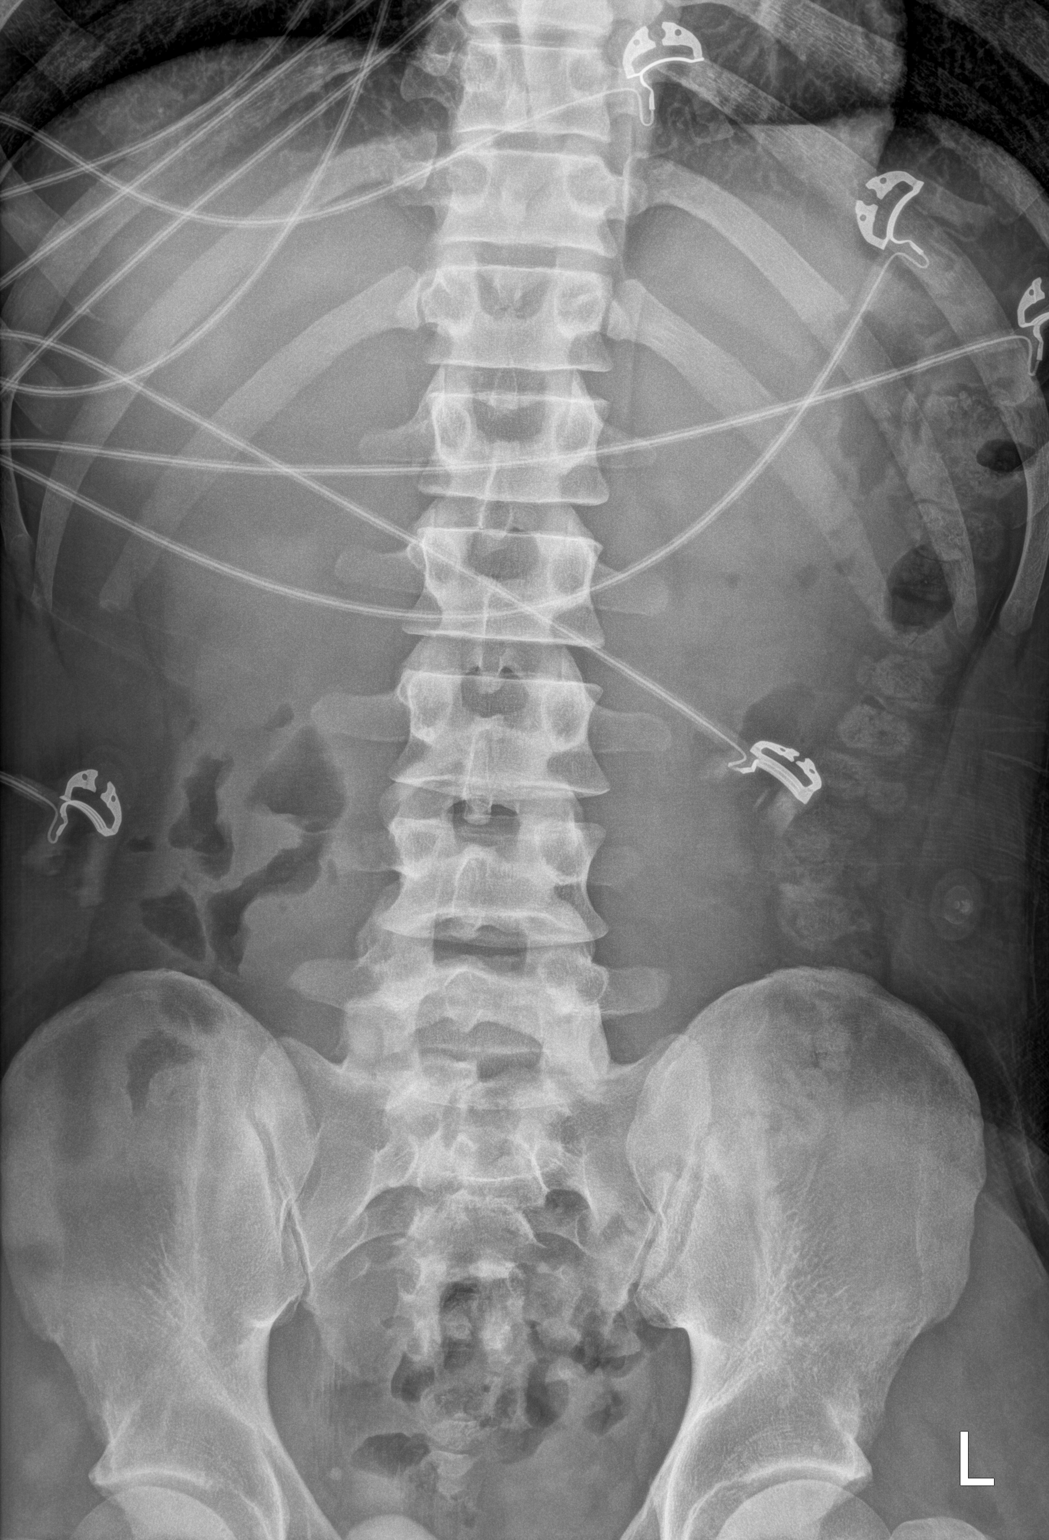

[abdomen supine (2 of 2)]
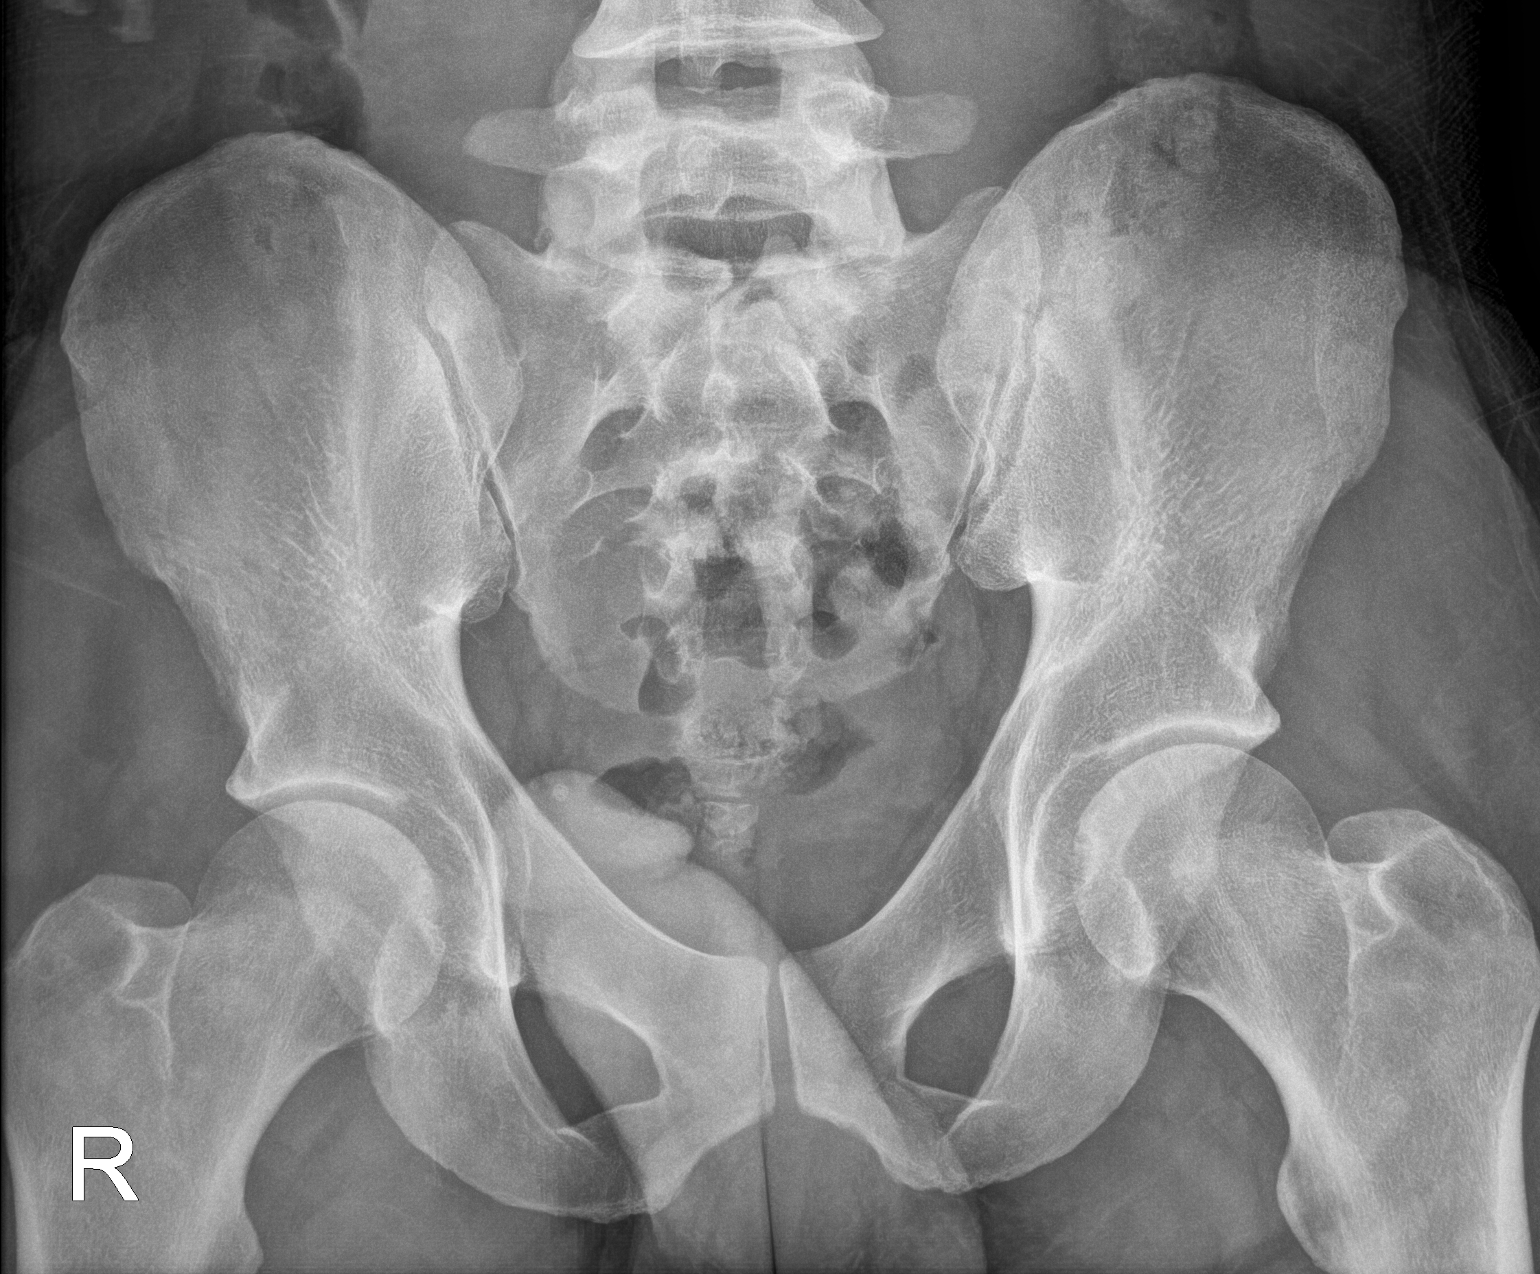

[3 of 3 positions shown; findings below may reference images not displayed]

FINDINGS: The bowel gas pattern is normal. There is no evidence of free air.
No radio-opaque calculi or other significant radiographic
abnormality is seen.
IMPRESSION: Nonobstructive pattern of bowel gas with gas and stool present to
the rectum. No free air in the abdomen.

## 2023-04-13 ENCOUNTER — Encounter: Payer: Self-pay | Admitting: Radiology

## 2023-04-13 ENCOUNTER — Other Ambulatory Visit: Payer: Self-pay

## 2023-04-13 ENCOUNTER — Inpatient Hospital Stay
Admission: EM | Admit: 2023-04-13 | Discharge: 2023-04-15 | DRG: 638 | Disposition: A | Discharge status: Home / Self Care | Payer: Medicaid Other | Attending: Internal Medicine | Admitting: Internal Medicine

## 2023-04-13 DIAGNOSIS — N179 Acute kidney failure, unspecified: Secondary | ICD-10-CM | POA: Diagnosis not present

## 2023-04-13 DIAGNOSIS — Z8249 Family history of ischemic heart disease and other diseases of the circulatory system: Secondary | ICD-10-CM

## 2023-04-13 DIAGNOSIS — I493 Ventricular premature depolarization: Secondary | ICD-10-CM | POA: Diagnosis not present

## 2023-04-13 DIAGNOSIS — Z833 Family history of diabetes mellitus: Secondary | ICD-10-CM

## 2023-04-13 DIAGNOSIS — Z79899 Other long term (current) drug therapy: Secondary | ICD-10-CM

## 2023-04-13 DIAGNOSIS — E860 Dehydration: Secondary | ICD-10-CM | POA: Diagnosis present

## 2023-04-13 DIAGNOSIS — E1010 Type 1 diabetes mellitus with ketoacidosis without coma: Principal | ICD-10-CM

## 2023-04-13 DIAGNOSIS — R531 Weakness: Secondary | ICD-10-CM | POA: Diagnosis present

## 2023-04-13 DIAGNOSIS — R000 Tachycardia, unspecified: Secondary | ICD-10-CM | POA: Diagnosis not present

## 2023-04-13 DIAGNOSIS — Z794 Long term (current) use of insulin: Secondary | ICD-10-CM | POA: Diagnosis not present

## 2023-04-13 DIAGNOSIS — D72829 Elevated white blood cell count, unspecified: Secondary | ICD-10-CM | POA: Diagnosis not present

## 2023-04-13 DIAGNOSIS — F17210 Nicotine dependence, cigarettes, uncomplicated: Secondary | ICD-10-CM | POA: Diagnosis present

## 2023-04-13 DIAGNOSIS — E876 Hypokalemia: Secondary | ICD-10-CM | POA: Diagnosis not present

## 2023-04-13 HISTORY — DX: Type 1 diabetes mellitus with ketoacidosis without coma: E10.10

## 2023-04-13 LAB — TROPONIN I (HIGH SENSITIVITY): Troponin I (High Sensitivity): 7 ng/L (ref <18)

## 2023-04-13 LAB — CBC WITH DIFFERENTIAL/PLATELET
Abs Immature Granulocytes: 0.05 10*3/uL (ref 0.00–0.07)
Basophils Absolute: 0.1 10*3/uL (ref 0.0–0.1)
Basophils Relative: 0 %
Eosinophils Absolute: 0 10*3/uL (ref 0.0–0.5)
Eosinophils Relative: 0 %
HCT: 47.7 % (ref 39.0–52.0)
Hemoglobin: 16.2 g/dL (ref 13.0–17.0)
Immature Granulocytes: 0 %
Lymphocytes Relative: 15 %
Lymphs Abs: 1.8 10*3/uL (ref 0.7–4.0)
MCH: 29 pg (ref 26.0–34.0)
MCHC: 34 g/dL (ref 30.0–36.0)
MCV: 85.5 fL (ref 80.0–100.0)
Monocytes Absolute: 0.7 10*3/uL (ref 0.1–1.0)
Monocytes Relative: 6 %
Neutro Abs: 9.1 10*3/uL — ABNORMAL HIGH (ref 1.7–7.7)
Neutrophils Relative %: 79 %
Platelets: 278 10*3/uL (ref 150–400)
RBC: 5.58 MIL/uL (ref 4.22–5.81)
RDW: 12.8 % (ref 11.5–15.5)
WBC: 11.7 10*3/uL — ABNORMAL HIGH (ref 4.0–10.5)
nRBC: 0 % (ref 0.0–0.2)

## 2023-04-13 LAB — COMPREHENSIVE METABOLIC PANEL
ALT: 38 U/L (ref 0–44)
AST: 29 U/L (ref 15–41)
Albumin: 5 g/dL (ref 3.5–5.0)
Alkaline Phosphatase: 102 U/L (ref 38–126)
Anion gap: 20 — ABNORMAL HIGH (ref 5–15)
BUN: 38 mg/dL — ABNORMAL HIGH (ref 6–20)
CO2: 16 mmol/L — ABNORMAL LOW (ref 22–32)
Calcium: 10 mg/dL (ref 8.9–10.3)
Chloride: 100 mmol/L (ref 98–111)
Creatinine, Ser: 1.81 mg/dL — ABNORMAL HIGH (ref 0.61–1.24)
GFR, Estimated: 52 mL/min — ABNORMAL LOW (ref >60)
Glucose, Bld: 369 mg/dL — ABNORMAL HIGH (ref 70–99)
Potassium: 4.5 mmol/L (ref 3.5–5.1)
Sodium: 136 mmol/L (ref 135–145)
Total Bilirubin: 1.2 mg/dL (ref 0.3–1.2)
Total Protein: 8.7 g/dL — ABNORMAL HIGH (ref 6.5–8.1)

## 2023-04-13 LAB — BLOOD GAS, VENOUS
Acid-base deficit: 9.2 mmol/L — ABNORMAL HIGH (ref 0.0–2.0)
Bicarbonate: 17 mmol/L — ABNORMAL LOW (ref 20.0–28.0)
O2 Saturation: 65.7 %
Patient temperature: 37
pCO2, Ven: 37 mmHg — ABNORMAL LOW (ref 44–60)
pH, Ven: 7.27 (ref 7.25–7.43)
pO2, Ven: 37 mmHg (ref 32–45)

## 2023-04-13 LAB — CBG MONITORING, ED: Glucose-Capillary: 367 mg/dL — ABNORMAL HIGH (ref 70–99)

## 2023-04-13 LAB — BETA-HYDROXYBUTYRIC ACID: Beta-Hydroxybutyric Acid: 6.28 mmol/L — ABNORMAL HIGH (ref 0.05–0.27)

## 2023-04-13 MED ORDER — LACTATED RINGERS IV SOLN: INTRAVENOUS | Status: DC

## 2023-04-13 MED ORDER — DEXTROSE IN LACTATED RINGERS 5 % IV SOLN: INTRAVENOUS | Status: DC

## 2023-04-13 MED ORDER — DEXTROSE 50 % IV SOLN: 0.0000 mL | INTRAVENOUS | Status: DC | PRN

## 2023-04-13 MED ORDER — POTASSIUM CHLORIDE 10 MEQ/100ML IV SOLN
10.0000 meq | INTRAVENOUS | Status: DC
  Administered 2023-04-14: 10 meq via INTRAVENOUS
  Filled 2023-04-13: qty 100

## 2023-04-13 MED ORDER — INSULIN REGULAR(HUMAN) IN NACL 100-0.9 UT/100ML-% IV SOLN
INTRAVENOUS | Status: DC
  Administered 2023-04-14: 8 [IU]/h via INTRAVENOUS
  Administered 2023-04-14: 9.5 [IU]/h via INTRAVENOUS
  Filled 2023-04-13: qty 100

## 2023-04-13 MED ORDER — LACTATED RINGERS IV BOLUS
2000.0000 mL | Freq: Once | INTRAVENOUS | Status: AC
  Administered 2023-04-14: 2000 mL via INTRAVENOUS

## 2023-04-13 NOTE — ED Provider Notes (Signed)
[facility] Provider Note    Event Date/Time   First MD Initiated Contact with Patient 04/13/23 2303     (approximate)   History   Weakness   HPI Wesley [name] is a 27 y.o. male with a history of type 1 diabetes who presents for evaluation of possible DKA.  He said that he started feeling ill yesterday.  He tried to get his 70/30 insulin filled, but he was told by the pharmacy that it is on [employer] and exercise psychologist.  He was "making do" with what he had, meaning that he was giving himself less insulin than usual.  Starting yesterday he felt general malaise and generally ill but without any specific symptoms.  Today got worse and tonight he is fairly certain he is in DKA.  He is not having any pain in his abdomen and no nausea or vomiting but has decreased appetite and increased thirst.  No recent fever, chest pain, shortness of breath, cough.  He has no dysuria but he notes that he has some "lumps" at the base of his penis that have been there "for a while".  He also has a nonpainful mass within his scrotum near the base of the scrotum that has also been there "for a while".  He has no known sexually transmitted diseases including HIV.     Physical Exam   Triage Vital Signs: ED Triage Vitals  Enc Vitals Group     BP 04/13/23 2240 118/85     Pulse Rate 04/13/23 2240 (!) 141     Resp 04/13/23 2240 18     Temp 04/13/23 2240 98.5 F (36.9 C)     Temp Source 04/13/23 2240 Oral     SpO2 04/13/23 2240 98 %     Weight 04/13/23 2238 79.4 kg (175 lb)     Height 04/13/23 2238 1.778 m (5\' 10" )     Head Circumference --      Peak Flow --      Pain Score --      Pain Loc --      Pain Edu? --      Excl. in GC? --     Most recent vital signs: Vitals:   04/13/23 2240  BP: 118/85  Pulse: (!) 141  Resp: 18  Temp: 98.5 F (36.9 C)  SpO2: 98%    General: Awake, no obvious distress. CV:  Good peripheral perfusion.  Tachycardia, regular rhythm.  Normal heart  sounds. Resp:  Normal effort. Speaking easily and comfortably, no accessory muscle usage nor intercostal retractions.  Lungs clear to auscultation bilaterally.  No tachypnea. Abd:  No distention.  No tenderness to palpation. GU:  GU exam is notable for a normal-appearing penis but with a cluster of fleshy nodules at the base of the penis on his scrotum suggestive of genital warts.  Additionally, he has a several centimeter palpable mass at the base of the scrotum that is firm and nontender, feels similar to lymphadenopathy, does not feel consistent with an abscess but is also in the wrong position to be epididymal swelling.   ED Results / Procedures / Treatments   Labs (all labs ordered are listed, but only abnormal results are displayed) Labs Reviewed  CBC WITH DIFFERENTIAL/PLATELET - Abnormal; Notable for the following components:      Result Value   WBC 11.7 (*)    Neutro Abs 9.1 (*)    All other components within normal limits  COMPREHENSIVE METABOLIC PANEL - Abnormal; Notable  for the following components:   CO2 16 (*)    Glucose, Bld 369 (*)    BUN 38 (*)    Creatinine, Ser 1.81 (*)    Total Protein 8.7 (*)    GFR, Estimated 52 (*)    Anion gap 20 (*)    All other components within normal limits  BLOOD GAS, VENOUS - Abnormal; Notable for the following components:   pCO2, Ven 37 (*)    Bicarbonate 17.0 (*)    Acid-base deficit 9.2 (*)    All other components within normal limits  BETA-HYDROXYBUTYRIC ACID - Abnormal; Notable for the following components:   Beta-Hydroxybutyric Acid 6.28 (*)    All other components within normal limits  CBG MONITORING, ED - Abnormal; Notable for the following components:   Glucose-Capillary 367 (*)    All other components within normal limits  CBG MONITORING, ED  TROPONIN I (HIGH SENSITIVITY)     EKG  ED ECG REPORT I, Loleta Rose, the attending physician, personally viewed and interpreted this ECG.  Date: 04/13/2023 EKG Time: 22:  43 Rate: 143 Rhythm: Sinus tachycardia QRS Axis: normal Intervals: normal ST/T Wave abnormalities: Non-specific ST segment / T-wave changes, but no clear evidence of acute ischemia. Narrative Interpretation: no definitive evidence of acute ischemia; does not meet STEMI criteria.    RADIOLOGY I ordered ordered a scrotal ultrasound that is pending at the time of admission   PROCEDURES:  Critical Care performed: Yes, see critical care procedure note(s)  .1-3 Lead EKG Interpretation  Performed by: Loleta Rose, MD Authorized by: Loleta Rose, MD     Interpretation: abnormal     ECG rate:  130   ECG rate assessment: tachycardic     Rhythm: sinus tachycardia     Ectopy: none     Conduction: normal   .Critical Care  Performed by: Loleta Rose, MD Authorized by: Loleta Rose, MD   Critical care provider statement:    Critical care time (minutes):  30   Critical care time was exclusive of:  Separately billable procedures and treating other patients   Critical care was necessary to treat or prevent imminent or life-threatening deterioration of the following conditions:  Metabolic crisis, dehydration and renal failure   Critical care was time spent personally by me on the following activities:  Development of treatment plan with patient or surrogate, evaluation of patient's response to treatment, examination of patient, obtaining history from patient or surrogate, ordering and performing treatments and interventions, ordering and review of laboratory studies, ordering and review of radiographic studies, pulse oximetry, re-evaluation of patient's condition and review of old charts     IMPRESSION / MDM / ASSESSMENT AND PLAN / ED COURSE  I reviewed the triage vital signs and the nursing notes.                              Differential diagnosis includes, but is not limited to, DKA, HHS, hyperglycemia, kidney injury, infectious process including STD.  Patient's presentation is  most consistent with acute presentation with potential threat to life or bodily function.  Labs/studies ordered: VBG, CMP, CBC with differential, high-sensitivity troponin, beta hydroxybutyric acid, scrotal ultrasound, EKG  Interventions/Medications given:  Medications  insulin regular, human (MYXREDLIN) 100 units/ 100 mL infusion (has no administration in time range)  lactated ringers infusion (has no administration in time range)  dextrose 5 % in lactated ringers infusion (has no administration in time range)  dextrose 50 % solution 0-50 mL (has no administration in time range)  potassium chloride 10 mEq in 100 mL IVPB (has no administration in time range)  lactated ringers bolus 2,000 mL (has no administration in time range)    (Note:  hospital course my include additional interventions and/or labs/studies not listed above.)   Presentation very consistent with DKA.  Patient is awake and alert and actually is well-appearing despite his vital signs and his lab work which is all consistent with metabolic acidosis as well as acute kidney injury/acute renal failure.  CO2 is 16, anion gap is 20.  I ordered 2 L of LR, as well as IV potassium and IV insulin per DKA protocol.  For the question of his scrotal mass and skin lesions, I suspect genital warts and the scrotal mass is uncertain.  It does not appear consistent with an infectious process particular because it is completely nonpainful and nontender.  However I believe that likely bears further investigation and may be worthy of infectious disease consult with the patient is in the hospital.  I also ordered a scrotal ultrasound for further assessment of the scrotal lesion and will defer to the hospitalist and/or infectious disease for further evaluation and treatment.  I discussed this plan with the patient and he agrees.  DKA seems to be the result of him trying to use less insulin due to the national shortage.  The patient is on the cardiac  monitor to evaluate for evidence of arrhythmia and/or significant heart rate changes.   Clinical Course as of 04/14/23 0001  Mon Apr 13, 2023  2346 Consulting hospitalist for admission [CF]  2347 Beta-Hydroxybutyric Acid(!): 6.28 [CF]  2359 Consulted Dr. Arville Care with the hospitalist service for admission [CF]    Clinical Course User Index [CF] Loleta Rose, MD     FINAL CLINICAL IMPRESSION(S) / ED DIAGNOSES   Final diagnoses:  Diabetic ketoacidosis without coma associated with type 1 diabetes mellitus (HCC)  Acute kidney injury (HCC)     Rx / DC Orders   ED Discharge Orders     None        Note:  This document was prepared using Dragon voice recognition software and may include unintentional dictation errors.   Loleta Rose, MD 04/14/23 0001

## 2023-04-13 NOTE — H&P (Signed)
[place]   PATIENT NAME: Wesley [name]    MR#:  [id number]  DATE OF BIRTH:  March 21, 1996  DATE OF ADMISSION:  04/13/2023  PRIMARY CARE PHYSICIAN: [facility]   Patient is coming from: Home  REQUESTING/REFERRING PHYSICIAN: Loleta Rose, MD  CHIEF COMPLAINT:   Chief Complaint  Patient presents with  . Weakness    HISTORY OF PRESENT ILLNESS:  Kelyn Demmons is a 27 y.o. African-American male with medical history significant for type 1 diabetes mellitus, who presented to the emergency room with acute onset of generalized weakness and fatigue with associated polydipsia since yesterday.  Given the national shortage of his NPH 70/30 he has been taking lower doses lately.  He denies any dyspnea or cough or wheezing.  No chest pain or palpitations.  No dysuria or oliguria or hematuria or flank pain.  No paresthesias or focal muscle weakness.  ED Course: Upon presentation to the emergency room, heart rate was 141 with otherwise normal vital signs.  Labs revealed blood glucose of 369 with CO2 of 16 and anion gap of 20, BUN of 38 and creatinine 1.81 above previous levels..  Beta-hydroxybutyrate was 6.28.  VBG showed pH 7.27 and HCO3 of 17.  EKG as reviewed by me : EKG showed sinus tachycardia with a rate of 143 with occasional PVCs. Imaging: None.  The patient was given 2 L bolus of IV lactated Ringer and 10 mcg of IV potassium chloride.  He will be admitted to stepdown unit bed for further evaluation and management. PAST MEDICAL HISTORY:   Past Medical History:  Diagnosis Date  . Diabetes mellitus without complication (HCC)    type 1 diabetes  . DKA, type 1 (HCC)     PAST SURGICAL HISTORY:   Past Surgical History:  Procedure Laterality Date  . HAND SURGERY    . INCISION AND DRAINAGE ABSCESS N/A 10/02/2015   Procedure: INCISION AND DRAINAGE ABSCESS;  Surgeon: Jerilee Field, MD;  Location: [facility] ORS;  Service: Urology;  Laterality: N/A;  .  INCISION AND DRAINAGE ABSCESS N/A 07/18/2019   Procedure: INCISION AND DRAINAGE SCROTAL ABSCESS;  Surgeon: Riki Altes, MD;  Location: [facility] ORS;  Service: Urology;  Laterality: N/A;  . KNEE ARTHROSCOPY WITH MEDIAL MENISECTOMY Right 03/17/2019   Procedure: KNEE ARTHROSCOPY WITH MEDIAL MENISECTOMY, POSSIBLE CHONDROPLASTY RIGHT - DIABETIC;  Surgeon: Signa Kell, MD;  Location: [facility] ORS;  Service: Orthopedics;  Laterality: Right;  . TONSILLECTOMY    . TONSILLECTOMY AND ADENOIDECTOMY    . UMBILICAL HERNIA REPAIR      SOCIAL HISTORY:   Social History   Tobacco Use  . Smoking status: Some Days    Packs/day: 0.50    Years: 4.00    Additional pack years: 0.00    Total pack years: 2.00    Types: Cigarettes  . Smokeless tobacco: Never  Substance Use Topics  . Alcohol use: Yes    FAMILY HISTORY:   Family History  Problem Relation Age of Onset  . Diabetes Mellitus II Paternal Grandfather   . Hypertension Mother     DRUG ALLERGIES:  No Known Allergies  REVIEW OF SYSTEMS:   ROS As per history of present illness. All pertinent systems were reviewed above. Constitutional, HEENT, cardiovascular, respiratory, GI, GU, musculoskeletal, neuro, psychiatric, endocrine, integumentary and hematologic systems were reviewed and are otherwise negative/unremarkable except for positive findings mentioned above in the HPI.   MEDICATIONS AT HOME:   Prior to Admission medications   Medication  Sig Start Date End Date Taking? Authorizing Provider  blood glucose meter kit and supplies KIT Dispense based on patient and insurance preference. Use up to four times daily as directed. (FOR ICD-9 250.00, 250.01). 04/05/22   Gilles Chiquito, MD  glucose blood test strip Use as instructed 04/05/22   Gilles Chiquito, MD  insulin NPH-regular Human (70-30) 100 UNIT/ML injection Inject 23 Units into the skin 2 (two) times daily with a meal. 04/05/22   Gilles Chiquito, MD  insulin regular (NOVOLIN R) 100 units/mL  injection Inject 0.01-0.1 mLs (1-10 Units total) into the skin 3 (three) times daily before meals. Per sliding scale 04/05/22   Gilles Chiquito, MD  Insulin Syringes, Disposable, U-100 0.5 ML MISC 40 Units by Does not apply route 3 (three) times daily. 04/05/22   Gilles Chiquito, MD  Lancets [facility] ULTRASOFT) lancets Please use as prescribed 04/05/22   Gilles Chiquito, MD  ondansetron (ZOFRAN-ODT) 4 MG disintegrating tablet Take 1 tablet (4 mg total) by mouth every 8 (eight) hours as needed for nausea or vomiting. 04/05/22   Gilles Chiquito, MD      VITAL SIGNS:  Blood pressure 121/86, pulse (!) 141, temperature 98.2 F (36.8 C), temperature source Oral, resp. rate 18, height 5\' 10"  (1.778 m), weight 72.3 kg, SpO2 100 %.  PHYSICAL EXAMINATION:  Physical Exam  GENERAL: Acute ill lethargic 26 y.o.-year-old African-American male patient lying in the bed with no acute distress.  He was slightly somnolent but easily arousable. EYES: Pupils equal, round, reactive to light and accommodation. No scleral icterus. Extraocular muscles intact.  HEENT: Head atraumatic, normocephalic. Oropharynx with dry mucous membrane and tongue and nasopharynx clear.  NECK:  Supple, no jugular venous distention. No thyroid enlargement, no tenderness.  LUNGS: Normal breath sounds bilaterally, no wheezing, rales,rhonchi or crepitation. No use of accessory muscles of respiration.  CARDIOVASCULAR: Regular rate and rhythm, S1, S2 normal. No murmurs, rubs, or gallops.  ABDOMEN: Soft, nondistended, nontender. Bowel sounds present. No organomegaly or mass.  EXTREMITIES: No pedal edema, cyanosis, or clubbing.  NEUROLOGIC: Cranial nerves II through XII are intact. Muscle strength 5/5 in all extremities. Sensation intact. Gait not checked.  PSYCHIATRIC: The patient is alert and oriented x 3.  Normal affect and good eye contact. SKIN: No obvious rash, lesion, or ulcer.   LABORATORY PANEL:   CBC Recent Labs  Lab  04/13/23 2244  WBC 11.7*  HGB 16.2  HCT 47.7  PLT 278   ------------------------------------------------------------------------------------------------------------------  Chemistries  Recent Labs  Lab 04/13/23 2244 04/14/23 0147  NA 136 139  K 4.5 4.4  CL 100 103  CO2 16* 21*  GLUCOSE 369* 209*  BUN 38* 34*  CREATININE 1.81* 1.40*  CALCIUM 10.0 9.7  AST 29  --   ALT 38  --   ALKPHOS 102  --   BILITOT 1.2  --    ------------------------------------------------------------------------------------------------------------------  Cardiac Enzymes No results for input(s): "TROPONINI" in the last 168 hours. ------------------------------------------------------------------------------------------------------------------  RADIOLOGY:  US SCROTUM W/DOPPLER  Result Date: 04/14/2023 CLINICAL DATA:  Nonpainful lump/mass at the base of the scrotum EXAM: SCROTAL ULTRASOUND DOPPLER ULTRASOUND OF THE TESTICLES TECHNIQUE: Complete ultrasound examination of the testicles, epididymis, and other scrotal structures was performed. Color and spectral Doppler ultrasound were also utilized to evaluate blood flow to the testicles. COMPARISON:  None Available. FINDINGS: Right testicle Measurements: 4.3 x 1.6 x 2.5 cm. No mass or microlithiasis visualized. Left testicle Measurements: 4.5 x 1.7 x 2.2 cm. No  mass or microlithiasis visualized. Right epididymis:  Normal in size and appearance. Left epididymis:  5 x 5 x 6 mm epididymal cyst. Hydrocele:  None visualized. Varicocele:  None visualized. Pulsed Doppler interrogation of both testes demonstrates normal low resistance arterial and venous waveforms bilaterally. 18 x 8 x 12 mm subcutaneous fluid collection/cyst along the base of the right scrotum, with mild peripheral vascularity, but without the classic appearance of an abscess. IMPRESSION: 18 mm subcutaneous fluid collection/cyst along the base of the right scrotum, with mild peripheral vascularity. This  appearance favors an irregular subcutaneous cyst over an abscess. Normal sonographic appearance of the bilateral testes. No evidence of testicular torsion. 6 mm left epididymal cyst, benign. Electronically Signed   By: Charline Bills M.D.   On: 04/14/2023 01:01      IMPRESSION AND PLAN:  Assessment and Plan: * DKA, type 1 (HCC) - The patient will be admitted to a stepdown bed. - We will continue the on IV insulin drip per [facility] DKA protocol. - The patient will be aggressively hydrated with IV lactated ringer. - Will follow serial BMPs.   AKI (acute kidney injury) (HCC) - This is likely prerenal due to volume depletion and dehydration. - The patient will be hydrated with IV lactated ringer as mentioned above. - We will follow serial BMPs. - We will avoid nephrotoxins.    DVT prophylaxis: Lovenox.  Advanced Care Planning:  Code Status: full code.  Family Communication:  The plan of care was discussed in details with the patient (and family). I answered all questions. The patient agreed to proceed with the above mentioned plan. Further management will depend upon hospital course. Disposition Plan: Back to previous home environment Consults called: none.  All the records are reviewed and case discussed with ED provider.  Status is: Inpatient    At the time of the admission, it appears that the appropriate admission status for this patient is inpatient.  This is judged to be reasonable and necessary in order to provide the required intensity of service to ensure the patient's safety given the presenting symptoms, physical exam findings and initial radiographic and laboratory data in the context of comorbid conditions.  The patient requires inpatient status due to high intensity of service, high risk of further deterioration and high frequency of surveillance required.  I certify that at the time of admission, it is my clinical judgment that the patient will require inpatient  hospital care extending more than 2 midnights.                            Dispo: The patient is from: Home              Anticipated d/c is to: Home              Patient currently is not medically stable to d/c.              Difficult to place patient: No  Authorized and performed by: Valente David, MD Total critical care time:    45    minutes. Due to a high probability of clinically significant, life-threatening deterioration, the patient required my highest level of preparedness to intervene emergently and I personally spent this critical care time directly and personally managing the patient.  This critical care time included obtaining a history, examining the patient, pulse oximetry, ordering and review of studies, arranging urgent treatment with development of management plan, evaluation of patient's  response to treatment, frequent reassessment, and discussions with other providers. This critical care time was performed to assess and manage the high probability of imminent, life-threatening deterioration that could result in multiorgan failure.  It was exclusive of separately billable procedures and treating other patients and teaching time.   Hannah Beat M.D on 04/14/2023 at 2:20 AM  [facility]   From 7 PM-7 AM, contact night-coverage [email or web address]  CC: Primary care physician; [facility]

## 2023-04-13 NOTE — ED Triage Notes (Signed)
PT thinks he may be in DKA. States this feels similar to previous episodes.Pt feels very weak and tired.

## 2023-04-14 ENCOUNTER — Inpatient Hospital Stay: Payer: Medicaid Other

## 2023-04-14 DIAGNOSIS — N179 Acute kidney failure, unspecified: Secondary | ICD-10-CM | POA: Insufficient documentation

## 2023-04-14 LAB — BASIC METABOLIC PANEL
Anion gap: 15 (ref 5–15)
Anion gap: 8 (ref 5–15)
Anion gap: 10 (ref 5–15)
Anion gap: 9 (ref 5–15)
BUN: 34 mg/dL — ABNORMAL HIGH (ref 6–20)
BUN: 27 mg/dL — ABNORMAL HIGH (ref 6–20)
BUN: 25 mg/dL — ABNORMAL HIGH (ref 6–20)
BUN: 22 mg/dL — ABNORMAL HIGH (ref 6–20)
CO2: 21 mmol/L — ABNORMAL LOW (ref 22–32)
CO2: 20 mmol/L — ABNORMAL LOW (ref 22–32)
CO2: 22 mmol/L (ref 22–32)
CO2: 23 mmol/L (ref 22–32)
Calcium: 9.7 mg/dL (ref 8.9–10.3)
Calcium: 9.2 mg/dL (ref 8.9–10.3)
Calcium: 9.4 mg/dL (ref 8.9–10.3)
Calcium: 9.1 mg/dL (ref 8.9–10.3)
Chloride: 103 mmol/L (ref 98–111)
Chloride: 108 mmol/L (ref 98–111)
Chloride: 108 mmol/L (ref 98–111)
Chloride: 102 mmol/L (ref 98–111)
Creatinine, Ser: 1.4 mg/dL — ABNORMAL HIGH (ref 0.61–1.24)
Creatinine, Ser: 1.07 mg/dL (ref 0.61–1.24)
Creatinine, Ser: 0.96 mg/dL (ref 0.61–1.24)
Creatinine, Ser: 1.13 mg/dL (ref 0.61–1.24)
GFR, Estimated: >60 mL/min (ref >60)
GFR, Estimated: >60 mL/min (ref >60)
GFR, Estimated: >60 mL/min (ref >60)
GFR, Estimated: >60 mL/min (ref >60)
Glucose, Bld: 209 mg/dL — ABNORMAL HIGH (ref 70–99)
Glucose, Bld: 146 mg/dL — ABNORMAL HIGH (ref 70–99)
Glucose, Bld: 133 mg/dL — ABNORMAL HIGH (ref 70–99)
Glucose, Bld: 225 mg/dL — ABNORMAL HIGH (ref 70–99)
Potassium: 4.4 mmol/L (ref 3.5–5.1)
Potassium: 3.8 mmol/L (ref 3.5–5.1)
Potassium: 3.6 mmol/L (ref 3.5–5.1)
Potassium: 4.2 mmol/L (ref 3.5–5.1)
Sodium: 139 mmol/L (ref 135–145)
Sodium: 136 mmol/L (ref 135–145)
Sodium: 140 mmol/L (ref 135–145)
Sodium: 134 mmol/L — ABNORMAL LOW (ref 135–145)

## 2023-04-14 LAB — CBC
HCT: 41.1 % (ref 39.0–52.0)
Hemoglobin: 14.1 g/dL (ref 13.0–17.0)
MCH: 29 pg (ref 26.0–34.0)
MCHC: 34.3 g/dL (ref 30.0–36.0)
MCV: 84.4 fL (ref 80.0–100.0)
Platelets: 234 10*3/uL (ref 150–400)
RBC: 4.87 MIL/uL (ref 4.22–5.81)
RDW: 12.8 % (ref 11.5–15.5)
WBC: 11.2 10*3/uL — ABNORMAL HIGH (ref 4.0–10.5)
nRBC: 0 % (ref 0.0–0.2)

## 2023-04-14 LAB — GLUCOSE, CAPILLARY
Glucose-Capillary: 119 mg/dL — ABNORMAL HIGH (ref 70–99)
Glucose-Capillary: 131 mg/dL — ABNORMAL HIGH (ref 70–99)
Glucose-Capillary: 145 mg/dL — ABNORMAL HIGH (ref 70–99)
Glucose-Capillary: 150 mg/dL — ABNORMAL HIGH (ref 70–99)
Glucose-Capillary: 161 mg/dL — ABNORMAL HIGH (ref 70–99)
Glucose-Capillary: 168 mg/dL — ABNORMAL HIGH (ref 70–99)
Glucose-Capillary: 171 mg/dL — ABNORMAL HIGH (ref 70–99)
Glucose-Capillary: 171 mg/dL — ABNORMAL HIGH (ref 70–99)
Glucose-Capillary: 187 mg/dL — ABNORMAL HIGH (ref 70–99)
Glucose-Capillary: 230 mg/dL — ABNORMAL HIGH (ref 70–99)
Glucose-Capillary: 237 mg/dL — ABNORMAL HIGH (ref 70–99)
Glucose-Capillary: 351 mg/dL — ABNORMAL HIGH (ref 70–99)
Glucose-Capillary: 352 mg/dL — ABNORMAL HIGH (ref 70–99)

## 2023-04-14 LAB — TROPONIN I (HIGH SENSITIVITY): Troponin I (High Sensitivity): 7 ng/L (ref <18)

## 2023-04-14 LAB — BETA-HYDROXYBUTYRIC ACID
Beta-Hydroxybutyric Acid: 3.17 mmol/L — ABNORMAL HIGH (ref 0.05–0.27)
Beta-Hydroxybutyric Acid: 1.89 mmol/L — ABNORMAL HIGH (ref 0.05–0.27)

## 2023-04-14 LAB — CBG MONITORING, ED
Glucose-Capillary: 280 mg/dL — ABNORMAL HIGH (ref 70–99)
Glucose-Capillary: 333 mg/dL — ABNORMAL HIGH (ref 70–99)

## 2023-04-14 LAB — HIV ANTIBODY (ROUTINE TESTING W REFLEX): HIV Screen 4th Generation wRfx: NONREACTIVE

## 2023-04-14 MED ORDER — ONDANSETRON 4 MG PO TBDP: 4.0000 mg | ORAL_TABLET | Freq: Three times a day (TID) | ORAL | Status: DC | PRN

## 2023-04-14 MED ORDER — LACTATED RINGERS IV SOLN: INTRAVENOUS | Status: DC

## 2023-04-14 MED ORDER — ENOXAPARIN SODIUM 40 MG/0.4ML IJ SOSY
40.0000 mg | PREFILLED_SYRINGE | INTRAMUSCULAR | Status: DC
  Administered 2023-04-14 – 2023-04-15 (×2): 40 mg via SUBCUTANEOUS
  Filled 2023-04-14 (×2): qty 0.4

## 2023-04-14 MED ORDER — DEXTROSE IN LACTATED RINGERS 5 % IV SOLN: INTRAVENOUS | Status: DC

## 2023-04-14 MED ORDER — CHLORHEXIDINE GLUCONATE CLOTH 2 % EX PADS: 6.0000 | MEDICATED_PAD | Freq: Every day | CUTANEOUS | Status: DC

## 2023-04-14 MED ORDER — ONDANSETRON HCL 4 MG/2ML IJ SOLN: 4.0000 mg | Freq: Four times a day (QID) | INTRAMUSCULAR | Status: DC | PRN

## 2023-04-14 MED ORDER — INSULIN GLARGINE-YFGN 100 UNIT/ML ~~LOC~~ SOLN
15.0000 [IU] | Freq: Once | SUBCUTANEOUS | Status: AC
  Administered 2023-04-14: 15 [IU] via SUBCUTANEOUS
  Filled 2023-04-14: qty 0.15

## 2023-04-14 MED ORDER — MAGNESIUM HYDROXIDE 400 MG/5ML PO SUSP: 30.0000 mL | Freq: Every day | ORAL | Status: DC | PRN

## 2023-04-14 MED ORDER — ORAL CARE MOUTH RINSE: 15.0000 mL | OROMUCOSAL | Status: DC | PRN

## 2023-04-14 MED ORDER — INSULIN REGULAR(HUMAN) IN NACL 100-0.9 UT/100ML-% IV SOLN: INTRAVENOUS | Status: DC

## 2023-04-14 MED ORDER — INSULIN ASPART 100 UNIT/ML IJ SOLN
0.0000 [IU] | INTRAMUSCULAR | Status: DC
  Administered 2023-04-14: 4 [IU] via SUBCUTANEOUS
  Administered 2023-04-14: 7 [IU] via SUBCUTANEOUS
  Administered 2023-04-14 (×2): 20 [IU] via SUBCUTANEOUS
  Filled 2023-04-14 (×4): qty 1

## 2023-04-14 MED ORDER — ONDANSETRON HCL 4 MG PO TABS: 4.0000 mg | ORAL_TABLET | Freq: Four times a day (QID) | ORAL | Status: DC | PRN

## 2023-04-14 MED ORDER — POTASSIUM CHLORIDE 10 MEQ/100ML IV SOLN
10.0000 meq | INTRAVENOUS | Status: AC
  Administered 2023-04-14 (×2): 10 meq via INTRAVENOUS
  Filled 2023-04-14 (×2): qty 100

## 2023-04-14 MED ORDER — DEXTROSE 50 % IV SOLN: 0.0000 mL | INTRAVENOUS | Status: DC | PRN

## 2023-04-14 MED ORDER — ACETAMINOPHEN 325 MG PO TABS: 650.0000 mg | ORAL_TABLET | Freq: Four times a day (QID) | ORAL | Status: DC | PRN

## 2023-04-14 MED ORDER — TRAZODONE HCL 50 MG PO TABS: 25.0000 mg | ORAL_TABLET | Freq: Every evening | ORAL | Status: DC | PRN

## 2023-04-14 MED ORDER — INSULIN GLARGINE-YFGN 100 UNIT/ML ~~LOC~~ SOLN
5.0000 [IU] | Freq: Once | SUBCUTANEOUS | Status: AC
  Administered 2023-04-14: 5 [IU] via SUBCUTANEOUS
  Filled 2023-04-14: qty 0.05

## 2023-04-14 MED ORDER — ACETAMINOPHEN 650 MG RE SUPP: 650.0000 mg | Freq: Four times a day (QID) | RECTAL | Status: DC | PRN

## 2023-04-14 NOTE — Assessment & Plan Note (Signed)
-   This is likely prerenal due to volume depletion and dehydration. - The patient will be hydrated with IV lactated ringer as mentioned above. - We will follow serial BMPs. - We will avoid nephrotoxins.

## 2023-04-14 NOTE — Inpatient Diabetes Management (Addendum)
Inpatient Diabetes Program Recommendations  AACE/ADA: New Consensus Statement on Inpatient Glycemic Control (2015)  Target Ranges:  Prepandial:   less than 140 mg/dL      Peak postprandial:   less than 180 mg/dL (1-2 hours)      Critically ill patients:  140 - 180 mg/dL    Latest Reference Range & Units 06/17/21 17:10 04/05/22 13:30  Hemoglobin A1C 4.8 - 5.6 % 10.5 (H) 10.0 (H)  240 mg/dl  (H): Data is abnormally high  Latest Reference Range & Units 04/13/23 22:44 04/14/23 01:47  Beta-Hydroxybutyric Acid 0.05 - 0.27 mmol/L 6.28 (H) 3.17 (H)  (H): Data is abnormally high  Latest Reference Range & Units 04/13/23 22:44  Sodium 135 - 145 mmol/L 136  Potassium 3.5 - 5.1 mmol/L 4.5  Chloride 98 - 111 mmol/L 100  CO2 22 - 32 mmol/L 16 (L)  Glucose 70 - 99 mg/dL 161 (H)  BUN 6 - 20 mg/dL 38 (H)  Creatinine 0.96 - 1.24 mg/dL 0.45 (H)  Calcium 8.9 - 10.3 mg/dL 40.9  Anion gap 5 - 15  20 (H)  (L): Data is abnormally low (H): Data is abnormally high  Latest Reference Range & Units 04/13/23 22:39 04/13/23 23:54 04/14/23 00:58 04/14/23 01:24 04/14/23 02:14 04/14/23 03:03 04/14/23 04:05 04/14/23 05:01 04/14/23 06:05  Glucose-Capillary 70 - 99 mg/dL 811 (H) 914 (H)  IV Insulin Drip Started 280 (H) 237 (H) 171 (H) 187 (H) 171 (H) 168 (H) 150 (H)  IV Insulin Drip Infusing  (H): Data is abnormally high    Admit with: DKA--Per MD notes: "Given the national shortage of his NPH 70/30 he has been taking lower doses lately."  History: Type 1 Diabetes  Home DM Meds: 70/30 Insulin 23 units BID        Regular Insulin 1-10 units TID per SSI  Current Orders: IV Insulin Drip    [facility]P: [facility]  Needs referral to Endocrinologist    MD- Note orders for Semglee 5 units X 1 dose + Novolog 0-20 units Q4 hours  This basal dose will likely not be enough for transition  Please consider instead for transition off the IV Insulin Drip:  Semglee 25 units Daily (80% of the  total basal insulin pt gets in his 2 doses of 70/30 insulin)  Novolog Sensitive Correction Scale/ SSI (0-9 units) TID AC + HS  Novolog 4 units TID with meals for meal coverage     Addendum 11:15am--Met w/ pt at bedside.  Pt told me he has been rationing his 70/30 Insulin due to the national shortage.  Is supposed to take 23 units BID of the 70/30 Insulin but has been only taking about 15 units BID to ration his vials.  Pt told me he now has Medicaid coverage (just reinstated coverage early June).  Asked pt if he has ever taken Basal/Bolus regimen--Pt has used Lantus and Novolog in the past as a pediatric patient.  Has not seen ENDO since 2018--Strongly encouraged pt to seek follow up care with ENDO--Will also attempt to call [facility] today to see if I can get him an appt.  Pt is willing to take 4 injections per day with Lantus and Novolog.  Per [place] Medicaid formulary, both Lantus and Novolog Insulin pens are covered and should be $4 co-pay.  Also discussed with patient diagnosis of DKA (pathophysiology), treatment of DKA, lab results, and transition plan to SQ insulin regimen.  Discussed with pt that current A1c is pending  and reviewed both goal A1c and goal CBG levels with pt.  Pt appreciative of visit and did not have any further questions for me at this time.       --Will follow patient during hospitalization--  Ambrose Finland RN, MSN, CDCES Diabetes Coordinator Inpatient Glycemic Control Team Team Pager: [phone] (8a-5p)

## 2023-04-14 NOTE — TOC Initial Note (Addendum)
Transition of Care [facility]) - Initial/Assessment Note    Patient Details  Name: Wesley [name] MRN: [id number] Date of Birth: [birth year]  Transition of Care [facility]) CM/SW Contact:    Kreg Shropshire, RN Phone Number: 04/14/2023, 10:56 AM  Clinical Narrative:                  Cm received medication consult for pt. 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 Cm messaged pharmacist Ardis Rowan about getting a different medication equivalent to 70/30 to pt can take full dose at home. He stated that    75/25, would work because there isn't a shortage and the dosage will be the same. He also stated he would run by the doctor to confirm. Pt also wanted more information about getting another PCP. Cm added PCP list to AVS for pt to call. He has transportation home with no other needs.Cm will continue to follow for any TOC needs     Patient Goals and CMS Choice            Expected Discharge Plan and Services       Living arrangements for the past 2 months: Single Family Home                                      Prior Living Arrangements/Services Living arrangements for the past 2 months: Single Family Home                     Activities of Daily Living Home Assistive Devices/Equipment: None ADL Screening (condition at time of admission) Patient's cognitive ability adequate to safely complete daily activities?: Yes Is the patient deaf or have difficulty hearing?: No Does the patient have difficulty seeing, even when wearing glasses/contacts?: No Does the patient have difficulty concentrating, remembering, or making decisions?: No Patient able to express need for assistance with ADLs?: Yes Does the patient have difficulty dressing or bathing?: No Independently performs ADLs?: Yes (appropriate for developmental age) Does the patient have difficulty walking or climbing stairs?: No Weakness of Legs: None Weakness of Arms/Hands:  None  Permission Sought/Granted                  Emotional Assessment   Attitude/Demeanor/Rapport: Engaged Affect (typically observed): Calm Orientation: : Oriented to Self, Oriented to Place, Oriented to  Time, Oriented to Situation      Admission diagnosis:  Acute kidney injury (HCC) [N17.9] DKA, type 1 (HCC) [E10.10] Diabetic ketoacidosis without coma associated with type 1 diabetes mellitus (HCC) [E10.10] Patient Active Problem List   Diagnosis Date Noted   AKI (acute kidney injury) (HCC) 04/14/2023   Hypokalemia 06/19/2021   DKA, type 1, not at goal [facility]) 06/18/2021   DKA (diabetic ketoacidosis) (HCC) 06/16/2021   Perineal abscess 01/03/2020   Hyperglycemia due to type 1 diabetes mellitus (HCC) 01/03/2020   Lactic acidosis 01/03/2020   DKA, type 1 (HCC) 08/23/2019   Cellulitis of scrotum 07/18/2019   Acute renal failure (HCC)    Tobacco use disorder 02/10/2018   DKA (diabetic ketoacidoses) 09/01/2016   Sepsis (HCC) 10/02/2015   Scrotal abscess    Diabetes mellitus with ketoacidosis (HCC) 08/13/2012   Type 1 diabetes mellitus (HCC) 02/06/2012   PCP:  [facility] Pharmacy:   [id number] - [place] ([address]), [place] - [address] ([address]) [place] [address] Phone: [phone] Fax: [phone]     Social Determinants of Health (SDOH) Social History: SDOH Screenings   Food Insecurity: No Food Insecurity (04/14/2023)  Housing: Low Risk  (04/14/2023)  Transportation Needs: No Transportation Needs (04/14/2023)  Utilities: Not At Risk (04/14/2023)  Tobacco Use: High Risk (04/13/2023)   SDOH Interventions:     Readmission Risk Interventions     No data to display

## 2023-04-14 NOTE — Assessment & Plan Note (Signed)
-   The patient will be admitted to a stepdown bed. - We will continue the on IV insulin drip per [facility] DKA protocol. - The patient will be aggressively hydrated with IV lactated ringer. - Will follow serial BMPs.  

## 2023-04-14 NOTE — Discharge Instructions (Signed)
Some PCP options in [place] area- not a comprehensive list  Please follow up to find a new Primary Care Provider in your area.  [id number] [facility]- [phone] [facility]- [phone] [facility]- [phone] [facility]- [phone] L[facility]- [phone]  or [facility] Physician Referral Line [phone]

## 2023-04-14 NOTE — Progress Notes (Signed)
Report given to [facility], Charity fundraiser. Room is dirty at this time.   Kittie Plater, RN

## 2023-04-14 NOTE — Inpatient Diabetes Management (Signed)
For Hospital:  Please consider:  1. Start Semglee 20 units Daily (Start AM 07/03)  2. Start Novolog 4 units TID with meals for meal coverage (please start today)      For Discharge:  Consider Changing pt to Lantus and Novolog and Discontinue 70/30 Insulin: Lantus Insulin Pen- Order # [id number] Novolog Insulin Pen- Order # [id number] Insulin Pen Needles- Order # [id number]    --Will follow patient during hospitalization--  Ambrose Finland RN, MSN, CDCES Diabetes Coordinator Inpatient Glycemic Control Team Team Pager: [phone] (8a-5p)

## 2023-04-14 NOTE — Progress Notes (Addendum)
PROGRESS NOTE   HPI was taken from Dr. Arville Care: Wesley [name] is a 27 y.o. African-American male with medical history significant for type 1 diabetes mellitus, who presented to the emergency room with acute onset of generalized weakness and fatigue with associated polydipsia since yesterday.  Given the national shortage of his NPH 70/30 he has been taking lower doses lately.  He denies any dyspnea or cough or wheezing.  No chest pain or palpitations.  No dysuria or oliguria or hematuria or flank pain.  No paresthesias or focal muscle weakness.   ED Course: Upon presentation to the emergency room, heart rate was 141 with otherwise normal vital signs.  Labs revealed blood glucose of 369 with CO2 of 16 and anion gap of 20, BUN of 38 and creatinine 1.81 above previous levels..  Beta-hydroxybutyrate was 6.28.  VBG showed pH 7.27 and HCO3 of 17.   EKG as reviewed by me : EKG showed sinus tachycardia with a rate of 143 with occasional PVCs. Imaging: None.   The patient was given 2 L bolus of IV lactated Ringer and 10 mcg of IV potassium chloride.  He will be admitted to stepdown unit bed for further evaluation and management.    Wesley [name]  ZOX:[id number] DOB: 18-Mar-1996 DOA: 04/13/2023 [facility]CP: [facility]    Assessment & Plan:   Principal Problem:   DKA, type 1 (HCC) Active Problems:   AKI (acute kidney injury) (HCC)  Assessment and Plan: DKA: anion gap is closed. Insulin drip has been weaned off. Continue on glargine, SSI w/ accuchecks. Resolved   High anion gap metabolic acidosis: resolved   DM1: poorly controlled, last HbA1c 10.0. Continue on glargine, SSI w/ accuchecks    AKI: likely secondary to dehydration & poorly controlled DM1. Continue on IVFs. Cr is labile.    Leukocytosis: likely reactive    DVT prophylaxis: lovenox  Code Status: full  Family Communication: Disposition Plan: likely d/c back home   Level of care: Stepdown  Status is:  Inpatient Remains inpatient appropriate because: severity of illness, was still on insulin drip but weaned off this afternoon. Will need insulin to go home with. Both Novolog & Humalog short-acting insulin pens are covered. Long-acting insulin covered as preferred pen is Lantus Passenger transport manager as per pharmacy.  Can likely d/c home tomorrow if BS better controlled        Consultants:    Procedures:   Antimicrobials:   Subjective: Pt c/o fatigue   Objective: Vitals:   04/14/23 0500 04/14/23 0600 04/14/23 0800 04/14/23 1000  BP: 121/74 124/72  (!) 137/99  Pulse: (!) 113 (!) 118  (!) 120  Resp:   16   Temp:   98.2 F (36.8 C)   TempSrc:   Axillary   SpO2: 99% 98%  99%  Weight:      Height:        Intake/Output Summary (Last 24 hours) at 04/14/2023 1400 Last data filed at 04/14/2023 1151 Gross per 24 hour  Intake 1382.06 ml  Output --  Net 1382.06 ml   Filed Weights   04/13/23 2238 04/14/23 0129  Weight: 79.4 kg 72.3 kg    Examination:  General exam: Appears calm and comfortable  Respiratory system: Clear to auscultation. Respiratory effort normal. Cardiovascular system: S1 & S2+ No rubs, gallops or clicks.  Gastrointestinal system: Abdomen is nondistended, soft and nontender.  Normal bowel sounds heard. Central nervous system: Alert and oriented. Moves all extremities  Psychiatry: Judgement and insight appear normal.  Mood & affect appropriate.     Data Reviewed: I have personally reviewed following labs and imaging studies  CBC: Recent Labs  Lab 04/13/23 2244 04/14/23 0532  WBC 11.7* 11.2*  NEUTROABS 9.1*  --   HGB 16.2 14.1  HCT 47.7 41.1  MCV 85.5 84.4  PLT 278 234   Basic Metabolic Panel: Recent Labs  Lab 04/13/23 2244 04/14/23 0147 04/14/23 0532 04/14/23 0854 04/14/23 1311  NA 136 139 136 140 134*  K 4.5 4.4 3.8 3.6 4.2  CL 100 103 108 108 102  CO2 16* 21* 20* 22 23  GLUCOSE 369* 209* 146* 133* 225*  BUN 38* 34* 27* 25* 22*  CREATININE 1.81*  1.40* 1.07 0.96 1.13  CALCIUM 10.0 9.7 9.2 9.4 9.1   GFR: Estimated Creatinine Clearance: 101.3 mL/min (by C-G formula based on SCr of 1.13 mg/dL). Liver Function Tests: Recent Labs  Lab 04/13/23 2244  AST 29  ALT 38  ALKPHOS 102  BILITOT 1.2  PROT 8.7*  ALBUMIN 5.0   No results for input(s): "LIPASE", "AMYLASE" in the last 168 hours. No results for input(s): "AMMONIA" in the last 168 hours. Coagulation Profile: No results for input(s): "INR", "PROTIME" in the last 168 hours. Cardiac Enzymes: No results for input(s): "CKTOTAL", "CKMB", "CKMBINDEX", "TROPONINI" in the last 168 hours. BNP (last 3 results) No results for input(s): "PROBNP" in the last 8760 hours. HbA1C: No results for input(s): "HGBA1C" in the last 72 hours. CBG: Recent Labs  Lab 04/14/23 0605 04/14/23 0806 04/14/23 0906 04/14/23 1012 04/14/23 1114  GLUCAP 150* 119* 131* 145* 230*   Lipid Profile: No results for input(s): "CHOL", "HDL", "LDLCALC", "TRIG", "CHOLHDL", "LDLDIRECT" in the last 72 hours. Thyroid Function Tests: No results for input(s): "TSH", "T4TOTAL", "FREET4", "T3FREE", "THYROIDAB" in the last 72 hours. Anemia Panel: No results for input(s): "VITAMINB12", "FOLATE", "FERRITIN", "TIBC", "IRON", "RETICCTPCT" in the last 72 hours. Sepsis Labs: No results for input(s): "PROCALCITON", "LATICACIDVEN" in the last 168 hours.  No results found for this or any previous visit (from the past 240 hour(s)).       Radiology Studies: US SCROTUM W/DOPPLER  Result Date: 04/14/2023 CLINICAL DATA:  Nonpainful lump/mass at the base of the scrotum EXAM: SCROTAL ULTRASOUND DOPPLER ULTRASOUND OF THE TESTICLES TECHNIQUE: Complete ultrasound examination of the testicles, epididymis, and other scrotal structures was performed. Color and spectral Doppler ultrasound were also utilized to evaluate blood flow to the testicles. COMPARISON:  None Available. FINDINGS: Right testicle Measurements: 4.3 x 1.6 x 2.5 cm. No  mass or microlithiasis visualized. Left testicle Measurements: 4.5 x 1.7 x 2.2 cm. No mass or microlithiasis visualized. Right epididymis:  Normal in size and appearance. Left epididymis:  5 x 5 x 6 mm epididymal cyst. Hydrocele:  None visualized. Varicocele:  None visualized. Pulsed Doppler interrogation of both testes demonstrates normal low resistance arterial and venous waveforms bilaterally. 18 x 8 x 12 mm subcutaneous fluid collection/cyst along the base of the right scrotum, with mild peripheral vascularity, but without the classic appearance of an abscess. IMPRESSION: 18 mm subcutaneous fluid collection/cyst along the base of the right scrotum, with mild peripheral vascularity. This appearance favors an irregular subcutaneous cyst over an abscess. Normal sonographic appearance of the bilateral testes. No evidence of testicular torsion. 6 mm left epididymal cyst, benign. Electronically Signed   By: Charline Bills M.D.   On: 04/14/2023 01:01        Scheduled Meds:  enoxaparin (LOVENOX) injection  40 mg Subcutaneous Q24H  insulin aspart  0-20 Units Subcutaneous Q4H   Continuous Infusions:  dextrose 5% lactated ringers Stopped (04/14/23 1118)   lactated ringers Stopped (04/14/23 0134)     LOS: 1 day    Time spent: 75 mis    Charise Killian, MD [facility] Pager [phone]  If 7PM-7AM, please contact night-coverage [email or web address] 04/14/2023, 2:00 PM

## 2023-04-15 ENCOUNTER — Other Ambulatory Visit (HOSPITAL_COMMUNITY): Payer: Self-pay

## 2023-04-15 LAB — BASIC METABOLIC PANEL
Anion gap: 9 (ref 5–15)
BUN: 20 mg/dL (ref 6–20)
CO2: 24 mmol/L (ref 22–32)
Calcium: 8.9 mg/dL (ref 8.9–10.3)
Chloride: 106 mmol/L (ref 98–111)
Creatinine, Ser: 0.9 mg/dL (ref 0.61–1.24)
GFR, Estimated: >60 mL/min (ref >60)
Glucose, Bld: 110 mg/dL — ABNORMAL HIGH (ref 70–99)
Potassium: 3 mmol/L — ABNORMAL LOW (ref 3.5–5.1)
Sodium: 139 mmol/L (ref 135–145)

## 2023-04-15 LAB — GLUCOSE, CAPILLARY
Glucose-Capillary: 103 mg/dL — ABNORMAL HIGH (ref 70–99)
Glucose-Capillary: 124 mg/dL — ABNORMAL HIGH (ref 70–99)
Glucose-Capillary: 172 mg/dL — ABNORMAL HIGH (ref 70–99)
Glucose-Capillary: 265 mg/dL — ABNORMAL HIGH (ref 70–99)

## 2023-04-15 LAB — POTASSIUM: Potassium: 4 mmol/L (ref 3.5–5.1)

## 2023-04-15 LAB — PHOSPHORUS: Phosphorus: 3.1 mg/dL (ref 2.5–4.6)

## 2023-04-15 LAB — MAGNESIUM: Magnesium: 1.9 mg/dL (ref 1.7–2.4)

## 2023-04-15 LAB — HEMOGLOBIN A1C
Hgb A1c MFr Bld: 11.5 % — ABNORMAL HIGH (ref 4.8–5.6)
Mean Plasma Glucose: 283 mg/dL

## 2023-04-15 MED ORDER — INSULIN GLARGINE 100 UNIT/ML SOLOSTAR PEN: 18.0000 [IU] | PEN_INJECTOR | Freq: Every day | SUBCUTANEOUS | 0 refills | Status: AC

## 2023-04-15 MED ORDER — POTASSIUM CHLORIDE CRYS ER 20 MEQ PO TBCR
40.0000 meq | EXTENDED_RELEASE_TABLET | ORAL | Status: AC
  Administered 2023-04-15 (×2): 40 meq via ORAL
  Filled 2023-04-15 (×2): qty 2

## 2023-04-15 MED ORDER — INSULIN ASPART 100 UNIT/ML IJ SOLN
4.0000 [IU] | Freq: Three times a day (TID) | INTRAMUSCULAR | Status: DC
  Administered 2023-04-15 (×3): 4 [IU] via SUBCUTANEOUS
  Filled 2023-04-15 (×3): qty 1

## 2023-04-15 MED ORDER — INSULIN ASPART 100 UNIT/ML FLEXPEN: 4.0000 [IU] | PEN_INJECTOR | Freq: Three times a day (TID) | SUBCUTANEOUS | 0 refills | Status: AC

## 2023-04-15 MED ORDER — INSULIN ASPART 100 UNIT/ML IJ SOLN
0.0000 [IU] | Freq: Three times a day (TID) | INTRAMUSCULAR | Status: DC
  Administered 2023-04-15: 5 [IU] via SUBCUTANEOUS
  Administered 2023-04-15: 2 [IU] via SUBCUTANEOUS
  Filled 2023-04-15 (×2): qty 1

## 2023-04-15 MED ORDER — INSULIN ASPART 100 UNIT/ML IJ SOLN: 0.0000 [IU] | Freq: Every day | INTRAMUSCULAR | Status: DC

## 2023-04-15 MED ORDER — INSULIN GLARGINE-YFGN 100 UNIT/ML ~~LOC~~ SOLN
20.0000 [IU] | Freq: Every day | SUBCUTANEOUS | Status: DC
  Administered 2023-04-15: 20 [IU] via SUBCUTANEOUS
  Filled 2023-04-15: qty 0.2

## 2023-04-15 NOTE — Discharge Summary (Signed)
Physician Discharge Summary   Patient: Wesley [name] MRN: [id number] DOB: 1996-05-31  Admit date:     04/13/2023  Discharge date: 04/15/23  Discharge Physician: Lurene Shadow   PCP: [facility]   Recommendations at discharge:   Follow-up with PCP in 1 week Follow-up with Dr. Gershon Crane, endocrinologist,at [facility] within 2 weeks of discharge  Discharge Diagnoses: Principal Problem:   DKA, type 1 (HCC) Active Problems:   AKI (acute kidney injury) (HCC)  Resolved Problems:   * No resolved hospital problems. [facility] Course:  Mr. Wesley [name] is a 27 y.o. African-American male with medical history significant for type 1 diabetes mellitus who presented to the hospital with generalized weakness, fatigue and polydipsia.  Given the national shortage of insulin NPH 70/30, he had been taking lower doses of prescribed dose.   He was found to have AKI and DKA with glucose of 369, bicarbonate of 16 and anion gap of 20.  Hemoglobin A1c was 11.5.  He was treated with IV fluids and IV insulin drip per DKA protocol.  DKA and AKI have resolved.  He also had hypokalemia that was repleted successfully.  He was evaluated by the diabetes care coordinator.  Patient will be switched to insulin glargine 18 units nightly and NovoLog 4 units 3 times daily at discharge.  He has also been prescribed Dexcom 7 for glucose monitoring at home.  His condition has improved and is deemed stable for discharge to home today. Discharge plan discussed with the patient and his girlfriend at the bedside.        Consultants: None Procedures performed: None Disposition: Home Diet recommendation:  Discharge Diet Orders (From admission, onward)     Start     Ordered   04/15/23 0000  Diet - low sodium heart healthy        04/15/23 1620   04/15/23 0000  Diet Carb Modified        04/15/23 1620           Cardiac and Carb modified diet DISCHARGE MEDICATION: Allergies as of  04/15/2023   No Known Allergies      Medication List     STOP taking these medications    blood glucose meter kit and supplies Kit   glucose blood test strip   insulin NPH-regular Human (70-30) 100 UNIT/ML injection   insulin regular 100 units/mL injection Commonly known as: NOVOLIN R   Insulin Syringes (Disposable) U-100 0.5 ML Misc   onetouch ultrasoft lancets       TAKE these medications    insulin aspart 100 UNIT/ML FlexPen Commonly known as: NOVOLOG Inject 4 Units into the skin 3 (three) times daily with meals.   insulin glargine 100 UNIT/ML Solostar Pen Commonly known as: LANTUS Inject 18 Units into the skin at bedtime. Start taking on: April 16, 2023        Follow-up Information     Sherlon Handing, MD. Schedule an appointment as soon as possible for a visit in 2 week(s).   Specialties: Internal Medicine, Endocrinology Contact information: [address] [place] [address] [phone]                Discharge Exam: Filed Weights   04/13/23 2238 04/14/23 0129  Weight: 79.4 kg 72.3 kg   GEN: NAD SKIN: Warm and dry EYES: No pallor or icterus ENT: MMM CV: RRR PULM: CTA B ABD: soft, ND, NT, +BS CNS: AAO x 3, non focal EXT: No  edema or tenderness   Condition at discharge: good  The results of significant diagnostics from this hospitalization (including imaging, microbiology, ancillary and laboratory) are listed below for reference.   Imaging Studies: US SCROTUM W/DOPPLER  Result Date: 04/14/2023 CLINICAL DATA:  Nonpainful lump/mass at the base of the scrotum EXAM: SCROTAL ULTRASOUND DOPPLER ULTRASOUND OF THE TESTICLES TECHNIQUE: Complete ultrasound examination of the testicles, epididymis, and other scrotal structures was performed. Color and spectral Doppler ultrasound were also utilized to evaluate blood flow to the testicles. COMPARISON:  None Available. FINDINGS: Right testicle Measurements: 4.3 x 1.6 x 2.5 cm. No mass or  microlithiasis visualized. Left testicle Measurements: 4.5 x 1.7 x 2.2 cm. No mass or microlithiasis visualized. Right epididymis:  Normal in size and appearance. Left epididymis:  5 x 5 x 6 mm epididymal cyst. Hydrocele:  None visualized. Varicocele:  None visualized. Pulsed Doppler interrogation of both testes demonstrates normal low resistance arterial and venous waveforms bilaterally. 18 x 8 x 12 mm subcutaneous fluid collection/cyst along the base of the right scrotum, with mild peripheral vascularity, but without the classic appearance of an abscess. IMPRESSION: 18 mm subcutaneous fluid collection/cyst along the base of the right scrotum, with mild peripheral vascularity. This appearance favors an irregular subcutaneous cyst over an abscess. Normal sonographic appearance of the bilateral testes. No evidence of testicular torsion. 6 mm left epididymal cyst, benign. Electronically Signed   By: Charline Bills M.D.   On: 04/14/2023 01:01    Microbiology: Results for orders placed or performed during the hospital encounter of 05/18/22  SARS Coronavirus 2 by RT PCR (hospital order, performed in [facility] hospital lab) *cepheid single result test* Anterior Nasal Swab     Status: None   Collection Time: 05/18/22 10:45 PM   Specimen: Anterior Nasal Swab  Result Value Ref Range Status   SARS Coronavirus 2 by RT PCR NEGATIVE NEGATIVE Final    Comment: (NOTE) SARS-CoV-2 target nucleic acids are NOT DETECTED.  The SARS-CoV-2 RNA is generally detectable in upper and lower respiratory specimens during the acute phase of infection. The lowest concentration of SARS-CoV-2 viral copies this assay can detect is 250 copies / mL. A negative result does not preclude SARS-CoV-2 infection and should not be used as the sole basis for treatment or other patient management decisions.  A negative result may occur with improper specimen collection / handling, submission of specimen other than nasopharyngeal swab,  presence of viral mutation(s) within the areas targeted by this assay, and inadequate number of viral copies (<250 copies / mL). A negative result must be combined with clinical observations, patient history, and epidemiological information.  Fact Sheet for Patients:   RoadLapTop.co.za  Fact Sheet for Healthcare Providers: http://kim-miller.com/  This test is not yet approved or  cleared by the [place] FDA and has been authorized for detection and/or diagnosis of SARS-CoV-2 by FDA under an Emergency Use Authorization (EUA).  This EUA will remain in effect (meaning this test can be used) for the duration of the COVID-19 declaration under Section 564(b)(1) of the Act, 21 U.S.C. section 360bbb-3(b)(1), unless the authorization is terminated or revoked sooner.  Performed at [facility], [address] [place], [place] [address]     Labs: CBC: Recent Labs  Lab 04/13/23 2244 04/14/23 0532  WBC 11.7* 11.2*  NEUTROABS 9.1*  --   HGB 16.2 14.1  HCT 47.7 41.1  MCV 85.5 84.4  PLT 278 234   Basic Metabolic Panel: Recent Labs  Lab 04/14/23  0147 04/14/23 0532 04/14/23 0854 04/14/23 1311 04/15/23 0333 04/15/23 1501  NA 139 136 140 134* 139  --   K 4.4 3.8 3.6 4.2 3.0* 4.0  CL 103 108 108 102 106  --   CO2 21* 20* 22 23 24   --   GLUCOSE 209* 146* 133* 225* 110*  --   BUN 34* 27* 25* 22* 20  --   CREATININE 1.40* 1.07 0.96 1.13 0.90  --   CALCIUM 9.7 9.2 9.4 9.1 8.9  --   MG  --   --   --   --  1.9  --   PHOS  --   --   --   --  3.1  --    Liver Function Tests: Recent Labs  Lab 04/13/23 2244  AST 29  ALT 38  ALKPHOS 102  BILITOT 1.2  PROT 8.7*  ALBUMIN 5.0   CBG: Recent Labs  Lab 04/14/23 1919 04/14/23 2333 04/15/23 0357 04/15/23 0812 04/15/23 1127  GLUCAP 161* 352* 103* 124* 172*    Discharge time spent: greater than 30 minutes.  Signed: Lurene Shadow, MD [facility] 04/15/2023

## 2023-04-15 NOTE — TOC Benefit Eligibility Note (Signed)
Pharmacy Patient Advocate Encounter  Insurance verification completed.    The patient is insured through [employer] and [employer] [place] [employer]  Ran test claim for [employer] and the current 30 day co-pay is $363.55 due to deductible on [employer].  [employer] [place] Medicaid Requires Prior Authorization.  Ran test claim for [employer] and Requires Prior Authorization.   This test claim was processed through [facility]- copay amounts may vary at other pharmacies due to pharmacy/plan contracts, or as the patient moves through the different stages of their insurance plan.    Roland Earl, CPHT Pharmacy Patient Advocate Specialist [facility] Patient Advocate Team Direct Number: [phone]  Fax: [phone]

## 2023-04-15 NOTE — Inpatient Diabetes Management (Addendum)
Inpatient Diabetes Program Recommendations  AACE/ADA: New Consensus Statement on Inpatient Glycemic Control (2015)  Target Ranges:  Prepandial:   less than 140 mg/dL      Peak postprandial:   less than 180 mg/dL (1-2 hours)      Critically ill patients:  140 - 180 mg/dL   Lab Results  Component Value Date   GLUCAP 124 (H) 04/15/2023   HGBA1C 11.5 (H) 04/14/2023    Latest Reference Range & Units 04/14/23 19:19 04/14/23 23:33 04/15/23 03:57 04/15/23 08:12  Glucose-Capillary 70 - 99 mg/dL 161 (H) 096 (H) 045 (H) 124 (H)  (H): Data is abnormally high  Discharge Recommendations: Long acting recommendations: Insulin Glargine (LANTUS) Solostar Pen 18 units QHS  Short acting recommendations:  Meal + Correction coverage Insulin aspart (NOVOLOG) FlexPen  Sensitive Scale.  4 units with meals + sensitive correction TID   Spoke with patient at bedside.  He cannot remember his last Lantus dose.  Called [facility] endocrinology to request an appointment for him.  They will need a referral to make him an appointment.  Received VO to place a Dexcom G7 prior to DC.  Will provide him with a sample to take home as well.    Called [facility] to ask if they would send a referral to [facility] endocrinology.  There was no answer as I believe they are closed now.  Asked patient to make an appt with CD so they can refer him to [facility] endocrinology.    He does not have a credit card attached to his iphone so he cannot open the Dexcom app.  He will attach a credit card to his phone when he gets home.  Explained how to apply and use the Dexcom G7.  He can watch a Youtube video on how to apply as well.  He verbalizes understanding.    Will continue to follow while inpatient.  Thank you, Dulce Sellar, MSN, CDCES Diabetes Coordinator Inpatient Diabetes Program [phone] (team pager from 8a-5p)

## 2023-04-19 ENCOUNTER — Other Ambulatory Visit: Payer: Self-pay

## 2023-04-19 ENCOUNTER — Encounter (HOSPITAL_COMMUNITY): Payer: Self-pay | Admitting: *Deleted

## 2023-04-19 ENCOUNTER — Emergency Department (HOSPITAL_COMMUNITY): Payer: 59

## 2023-04-19 ENCOUNTER — Emergency Department (HOSPITAL_COMMUNITY)
Admission: EM | Admit: 2023-04-19 | Discharge: 2023-04-19 | Disposition: A | Discharge status: Home / Self Care | Payer: 59 | Attending: Emergency Medicine | Admitting: Emergency Medicine

## 2023-04-19 DIAGNOSIS — L03317 Cellulitis of buttock: Secondary | ICD-10-CM | POA: Insufficient documentation

## 2023-04-19 DIAGNOSIS — E109 Type 1 diabetes mellitus without complications: Secondary | ICD-10-CM | POA: Diagnosis not present

## 2023-04-19 DIAGNOSIS — Z794 Long term (current) use of insulin: Secondary | ICD-10-CM | POA: Diagnosis not present

## 2023-04-19 DIAGNOSIS — R000 Tachycardia, unspecified: Secondary | ICD-10-CM | POA: Insufficient documentation

## 2023-04-19 DIAGNOSIS — R590 Localized enlarged lymph nodes: Secondary | ICD-10-CM | POA: Diagnosis not present

## 2023-04-19 DIAGNOSIS — L0231 Cutaneous abscess of buttock: Secondary | ICD-10-CM | POA: Diagnosis not present

## 2023-04-19 LAB — BASIC METABOLIC PANEL
Anion gap: 11 (ref 5–15)
BUN: 13 mg/dL (ref 6–20)
CO2: 27 mmol/L (ref 22–32)
Calcium: 9.1 mg/dL (ref 8.9–10.3)
Chloride: 96 mmol/L — ABNORMAL LOW (ref 98–111)
Creatinine, Ser: 0.8 mg/dL (ref 0.61–1.24)
GFR, Estimated: >60 mL/min (ref >60)
Glucose, Bld: 292 mg/dL — ABNORMAL HIGH (ref 70–99)
Potassium: 4.2 mmol/L (ref 3.5–5.1)
Sodium: 134 mmol/L — ABNORMAL LOW (ref 135–145)

## 2023-04-19 LAB — CBC WITH DIFFERENTIAL/PLATELET
Abs Immature Granulocytes: 0.04 10*3/uL (ref 0.00–0.07)
Basophils Absolute: 0 10*3/uL (ref 0.0–0.1)
Basophils Relative: 0 %
Eosinophils Absolute: 0.1 10*3/uL (ref 0.0–0.5)
Eosinophils Relative: 1 %
HCT: 39.9 % (ref 39.0–52.0)
Hemoglobin: 13.6 g/dL (ref 13.0–17.0)
Immature Granulocytes: 0 %
Lymphocytes Relative: 11 %
Lymphs Abs: 1 10*3/uL (ref 0.7–4.0)
MCH: 29.8 pg (ref 26.0–34.0)
MCHC: 34.1 g/dL (ref 30.0–36.0)
MCV: 87.3 fL (ref 80.0–100.0)
Monocytes Absolute: 0.8 10*3/uL (ref 0.1–1.0)
Monocytes Relative: 9 %
Neutro Abs: 7.1 10*3/uL (ref 1.7–7.7)
Neutrophils Relative %: 79 %
Platelets: 222 10*3/uL (ref 150–400)
RBC: 4.57 MIL/uL (ref 4.22–5.81)
RDW: 13.2 % (ref 11.5–15.5)
WBC: 9 10*3/uL (ref 4.0–10.5)
nRBC: 0 % (ref 0.0–0.2)

## 2023-04-19 LAB — CBG MONITORING, ED: Glucose-Capillary: 232 mg/dL — ABNORMAL HIGH (ref 70–99)

## 2023-04-19 LAB — RAPID HIV SCREEN (HIV 1/2 AB+AG)
HIV 1/2 Antibodies: NONREACTIVE
HIV-1 P24 Antigen - HIV24: NONREACTIVE

## 2023-04-19 MED ORDER — VANCOMYCIN HCL IN DEXTROSE 1-5 GM/200ML-% IV SOLN: 1000.0000 mg | Freq: Once | INTRAVENOUS | Status: DC

## 2023-04-19 MED ORDER — NAPROXEN 500 MG PO TABS: 500.0000 mg | ORAL_TABLET | Freq: Two times a day (BID) | ORAL | 0 refills | Status: DC

## 2023-04-19 MED ORDER — IOHEXOL 300 MG/ML  SOLN
75.0000 mL | Freq: Once | INTRAMUSCULAR | Status: AC | PRN
  Administered 2023-04-19: 75 mL via INTRAVENOUS

## 2023-04-19 MED ORDER — HYDROMORPHONE HCL 1 MG/ML IJ SOLN
1.0000 mg | Freq: Once | INTRAMUSCULAR | Status: AC
  Administered 2023-04-19: 1 mg via INTRAVENOUS
  Filled 2023-04-19: qty 1

## 2023-04-19 MED ORDER — DOXYCYCLINE HYCLATE 100 MG PO CAPS: 100.0000 mg | ORAL_CAPSULE | Freq: Two times a day (BID) | ORAL | 0 refills | Status: DC

## 2023-04-19 MED ORDER — VANCOMYCIN HCL 2000 MG/400ML IV SOLN
2000.0000 mg | Freq: Once | INTRAVENOUS | Status: AC
  Administered 2023-04-19: 2000 mg via INTRAVENOUS
  Filled 2023-04-19: qty 400

## 2023-04-19 MED ORDER — SODIUM CHLORIDE 0.9 % IV SOLN: INTRAVENOUS | Status: DC

## 2023-04-19 NOTE — ED Provider Notes (Signed)
[place] EMERGENCY DEPARTMENT AT [facility] Provider Note   CSN: [id number] Arrival date & time: 04/19/23  1705     History  Chief Complaint  Patient presents with   Abscess    Wesley [name] is a 27 y.o. male.   Abscess  This patient is a 27 year old male, he has a history of diabetes on insulin, denies any other medical problems, denies having HIV or risk for HIV, unfortunately he has had recurrent abscesses and has been struggling for about the last 8 or 9 days with an abscess in the right inguinal region beside the scrotum, this has been gradually worsening despite using hot compresses, soaking in a hot tub, he has not been on antibiotics, he has not been seen by his family doctor.  It appears that the patient had been admitted with diabetic ketoacidosis 6 days ago, he had been seen in the emergency department multiple times for nausea and vomiting and had actually been admitted for diabetes in 2022 as well.  The patient reports being compliant with his medications, he does have nausea but no vomiting    Home Medications Prior to Admission medications   Medication Sig Start Date End Date Taking? Authorizing Provider  doxycycline (VIBRAMYCIN) 100 MG capsule Take 1 capsule (100 mg total) by mouth 2 (two) times daily. 04/19/23  Yes Eber Hong, MD  naproxen (NAPROSYN) 500 MG tablet Take 1 tablet (500 mg total) by mouth 2 (two) times daily with a meal. 04/19/23  Yes Eber Hong, MD  insulin aspart (NOVOLOG) 100 UNIT/ML FlexPen Inject 4 Units into the skin 3 (three) times daily with meals. 04/15/23 05/15/23  Lurene Shadow, MD  insulin glargine (LANTUS) 100 UNIT/ML Solostar Pen Inject 18 Units into the skin at bedtime. 04/16/23   Lurene Shadow, MD      Allergies    Patient has no known allergies.    Review of Systems   Review of Systems  All other systems reviewed and are negative.   Physical Exam Updated Vital Signs BP (!) 131/92   Pulse (!) 101   Temp 99 F (37.2  C)   Resp 16   Ht 1.778 m (5\' 10" )   Wt 79.4 kg   SpO2 100%   BMI 25.11 kg/m  Physical Exam Vitals and nursing note reviewed.  Constitutional:      General: He is not in acute distress.    Appearance: He is well-developed.  HENT:     Head: Normocephalic and atraumatic.     Mouth/Throat:     Pharynx: No oropharyngeal exudate.  Eyes:     General: No scleral icterus.       Right eye: No discharge.        Left eye: No discharge.     Conjunctiva/sclera: Conjunctivae normal.     Pupils: Pupils are equal, round, and reactive to light.  Neck:     Thyroid: No thyromegaly.     Vascular: No JVD.  Cardiovascular:     Rate and Rhythm: Regular rhythm. Tachycardia present.     Heart sounds: Normal heart sounds. No murmur heard.    No friction rub. No gallop.  Pulmonary:     Effort: Pulmonary effort is normal. No respiratory distress.     Breath sounds: Normal breath sounds. No wheezing or rales.  Abdominal:     General: Bowel sounds are normal. There is no distension.     Palpations: Abdomen is soft. There is no mass.     Tenderness:  There is no abdominal tenderness.  Genitourinary:    Comments: There is inguinal lymphadenopathy present on the right Musculoskeletal:        General: Tenderness present. Normal range of motion.     Cervical back: Normal range of motion and neck supple.     Comments: Tenderness in the right inguinal region in the area between the scrotum and the thigh, area of induration and swelling here.  Scrotum otherwise appears normal without redness, no crepitance or subcutaneous emphysema, normal penis and testicles  Lymphadenopathy:     Cervical: No cervical adenopathy.  Skin:    General: Skin is warm and dry.     Findings: No erythema or rash.  Neurological:     Mental Status: He is alert.     Coordination: Coordination normal.  Psychiatric:        Behavior: Behavior normal.     ED Results / Procedures / Treatments   Labs (all labs ordered are listed,  but only abnormal results are displayed) Labs Reviewed  BASIC METABOLIC PANEL - Abnormal; Notable for the following components:      Result Value   Sodium 134 (*)    Chloride 96 (*)    Glucose, Bld 292 (*)    All other components within normal limits  CBG MONITORING, ED - Abnormal; Notable for the following components:   Glucose-Capillary 232 (*)    All other components within normal limits  AEROBIC/ANAEROBIC CULTURE W GRAM STAIN (SURGICAL/DEEP WOUND)  CBC WITH DIFFERENTIAL/PLATELET  RAPID HIV SCREEN (HIV 1/2 AB+AG)    EKG None  Radiology CT PELVIS W CONTRAST  Result Date: 04/19/2023 CLINICAL DATA:  Abdominal abscess in the right groin. EXAM: CT PELVIS WITH CONTRAST TECHNIQUE: Multidetector CT imaging of the pelvis was performed using the standard protocol following the bolus administration of intravenous contrast. RADIATION DOSE REDUCTION: This exam was performed according to the departmental dose-optimization program which includes automated exposure control, adjustment of the mA and/or kV according to patient size and/or use of iterative reconstruction technique. CONTRAST:  75mL OMNIPAQUE IOHEXOL 300 MG/ML  SOLN COMPARISON:  Scrotal ultrasound 04/14/2023. CT of the pelvis 07/18/2019. FINDINGS: Urinary Tract:  No abnormality visualized. Bowel: Unremarkable visualized pelvic bowel loops. The appendix is within normal limits. Vascular/Lymphatic: Vascular structures are within normal limits. There are prominent left inguinal lymph nodes and nonenlarged and enlarged right inguinal lymph nodes measuring up to 11 mm short axis. Reproductive: Prostate gland is within normal limits. There is scrotal wall edema inferiorly. Other:  No ascites or focal abdominal wall hernia. Musculoskeletal: There is skin thickening, subcutaneous edema and swelling involving the right inferior buttocks extending into the medial right thigh. There is also mild edema in the inferior left buttocks. There is no evidence for  foreign body or soft tissue air. Subtle rounded enhancing fluid collection is seen in the inferior right scrotum measuring 1.7 by 0.8 by 1.3 cm as seen on recent ultrasound. No additional fluid collections are identified. No significant osseous abnormalities. IMPRESSION: 1. Findings compatible with cellulitis involving the right inferior buttocks extending into the medial right thigh. No evidence for foreign body or soft tissue air. 2. Mild edema in the inferior left buttocks. 3. Small enhancing fluid collection in the inferior right scrotum as seen on recent ultrasound. 4. Bilateral inguinal lymphadenopathy, likely reactive. Electronically Signed   By: Darliss Cheney M.D.   On: 04/19/2023 19:19    Procedures Procedures    Medications Ordered in ED Medications  0.9 %  sodium  chloride infusion ( Intravenous New Bag/Given 04/19/23 1754)  HYDROmorphone (DILAUDID) injection 1 mg (1 mg Intravenous Given 04/19/23 1802)  vancomycin (VANCOREADY) IVPB 2000 mg/400 mL (2,000 mg Intravenous New Bag/Given 04/19/23 1808)  iohexol (OMNIPAQUE) 300 MG/ML solution 75 mL (75 mLs Intravenous Contrast Given 04/19/23 1845)    ED Course/ Medical Decision Making/ A&P                             Medical Decision Making Amount and/or Complexity of Data Reviewed Labs: ordered. Radiology: ordered.  Risk Prescription drug management.    This patient presents to the ED for concern of tachycardia, known poorly controlled diabetes and now a new swelling in the right inguinal region, this involves an extensive number of treatment options, and is a complaint that carries with it a high risk of complications and morbidity.  The differential diagnosis includes abscess, cellulitis, sepsis, the patient could be in DKA as well   Co morbidities that complicate the patient evaluation  Poorly treated diabetes   Additional history obtained:  Additional history obtained from electronic medical record External records from outside  source obtained and reviewed including recent admission to the hospital There is no comment on inguinal pain or tenderness or infection in the admission or discharge notes   Lab Tests:  I Ordered, and personally interpreted labs.  The pertinent results include: No leukocytosis, hyperglycemic but no signs of DKA   Imaging Studies ordered:  I ordered imaging studies including CT scan of the pelvis I independently visualized and interpreted imaging which showed cellulitis without a discrete abscess I agree with the radiologist interpretation   Cardiac Monitoring: / EKG:  The patient was maintained on a cardiac monitor.  I personally viewed and interpreted the cardiac monitored which showed an underlying rhythm of: Mild sinus tachycardia with a rate of 100 bpm at discharge    Problem List / ED Course / Critical interventions / Medication management  I had a long discussion with the patient at the bedside regarding the treatment of what appears to be some early sepsis.  His heart rate was initially very high at 124 and his temperature was 100.2 however over the course of the visit his temperature came down to 99 his heart rate lowered to 101 and he has no leukocytosis.  I offered the patient admission to the hospital stating that that is what I would want for a family member and so that is what I offered him but he declines and states he would rather go home.  He understands that there is a risk is a diabetic that this could get worse but is willing to accept that risk and states that he will come back for worsening symptoms.  He has his medical decision-making capacity I think this is a reasonable alternative I ordered medication including vancomycin IV fluids hydromorphone and ketorolac for infection and pain Reevaluation of the patient after these medicines showed that the patient evidently improved I have reviewed the patients home medicines and have made adjustments as needed   Social  Determinants of Health:  Type I diabetic, states he is comfortable with the medications that he has at home   Test / Admission - Considered:  Offered and strongly recommended admission but the patient declines, states he will return should symptoms worsen.         Final Clinical Impression(s) / ED Diagnoses Final diagnoses:  Cellulitis of buttock  Rx / DC Orders ED Discharge Orders          Ordered    doxycycline (VIBRAMYCIN) 100 MG capsule  2 times daily        04/19/23 2013    naproxen (NAPROSYN) 500 MG tablet  2 times daily with meals        04/19/23 2013              Eber Hong, MD 04/19/23 2014

## 2023-04-19 NOTE — Discharge Instructions (Signed)
As we discussed I have recommended that you be admitted to the hospital overnight for observation and to get more antibiotics.  You have declined, I have given you the medication for home including doxycycline which I want you to take twice a day for 10 days and Naprosyn twice a day for pain.  If you should develop severe or worsening pain, weakness, vomiting, fever, swelling, spreading redness or any other worsening symptoms including worsening blood sugar control I would like for you to come back to the emergency department immediately.  If you change your mind about being admitted to the hospital you are also more than welcome to return for repeat evaluation and potential admission.  Otherwise I would like for you to see your doctor within 2 to 3 days for recheck even if you are getting better.

## 2023-04-19 NOTE — ED Triage Notes (Signed)
Pt states abscess to right groin for 1.5 weeks, tried warm compress without success. Hx of same. Fever the other day, denies any more fevers.  Denies any N/V.

## 2023-04-19 NOTE — ED Notes (Signed)
Reporting his pain is 8/10. LHe had good pain relief until his CT, when he had to hold his legs together, which increased his pain.

## 2023-08-03 ENCOUNTER — Other Ambulatory Visit: Payer: Self-pay

## 2023-08-03 ENCOUNTER — Emergency Department
Admission: EM | Admit: 2023-08-03 | Discharge: 2023-08-04 | Disposition: A | Discharge status: Home / Self Care | Payer: 59 | Attending: Emergency Medicine | Admitting: Emergency Medicine

## 2023-08-03 DIAGNOSIS — L723 Sebaceous cyst: Secondary | ICD-10-CM | POA: Diagnosis not present

## 2023-08-03 MED ORDER — LIDOCAINE-EPINEPHRINE (PF) 2 %-1:200000 IJ SOLN: 20.0000 mL | Freq: Once | INTRAMUSCULAR | Status: DC

## 2023-08-03 NOTE — ED Triage Notes (Signed)
Patient ambulatory to triage with complaints of a boil under the left arm pit that is getting bigger over the past 1x week and more painful. Denies drainage/foul smell. Endorses having previous one that have to be cut open and removed.

## 2023-08-04 MED ORDER — CEPHALEXIN 500 MG PO CAPS: 500.0000 mg | ORAL_CAPSULE | Freq: Two times a day (BID) | ORAL | 0 refills | Status: DC

## 2023-08-04 NOTE — ED Provider Notes (Signed)
   [facility] Provider Note    Event Date/Time   First MD Initiated Contact with Patient 08/03/23 2340     (approximate)   History   Cyst   HPI  Wesley [name] is a 27 y.o. male who presents with complaints of a cyst in his left axilla.  He reports he has had a cyst in his groin before which required surgical removal     Physical Exam   Triage Vital Signs: ED Triage Vitals  Encounter Vitals Group     BP 08/03/23 2031 (!) 145/102     Systolic BP Percentile --      Diastolic BP Percentile --      Pulse Rate 08/03/23 2031 (!) 106     Resp 08/03/23 2031 18     Temp 08/03/23 2031 98.4 F (36.9 C)     Temp Source 08/03/23 2031 Oral     SpO2 08/03/23 2031 99 %     Weight 08/03/23 2032 86.2 kg (190 lb)     Height 08/03/23 2032 1.753 m (5\' 9" )     Head Circumference --      Peak Flow --      Pain Score 08/03/23 2032 10     Pain Loc --      Pain Education --      Exclude from Growth Chart --     Most recent vital signs: Vitals:   08/03/23 2031 08/04/23 0117  BP: (!) 145/102 (!) 141/89  Pulse: (!) 106 89  Resp: 18 18  Temp: 98.4 F (36.9 C)   SpO2: 99% 99%     General: Awake, no distress.  CV:  Good peripheral perfusion.  Resp:  Normal effort.  Abd:  No distention.  Other:  Left axilla, firm cystic structure, not consistent with abscess, suspect epidermoid cyst   ED Results / Procedures / Treatments   Labs (all labs ordered are listed, but only abnormal results are displayed) Labs Reviewed - No data to display   EKG     RADIOLOGY     PROCEDURES:  Critical Care performed:   Procedures   MEDICATIONS ORDERED IN ED: Medications - No data to display   IMPRESSION / MDM / ASSESSMENT AND PLAN / ED COURSE  I reviewed the triage vital signs and the nursing notes. Patient's presentation is most consistent with acute, uncomplicated illness.  Suspect epidermoid cyst based on exam, not consistent with abscess, discussed  with patient need for surgical follow-up for removal        FINAL CLINICAL IMPRESSION(S) / ED DIAGNOSES   Final diagnoses:  Sebaceous cyst of left axilla     Rx / DC Orders   ED Discharge Orders          Ordered    cephALEXin (KEFLEX) 500 MG capsule  2 times daily        08/04/23 0030             Note:  This document was prepared using Dragon voice recognition software and may include unintentional dictation errors.   Jene Every, MD 08/04/23 [phone]

## 2023-09-21 DIAGNOSIS — L03311 Cellulitis of abdominal wall: Secondary | ICD-10-CM | POA: Diagnosis not present

## 2023-09-21 NOTE — Progress Notes (Addendum)
 Subjective Patient ID: Wesley [name] is a 27 y.o. male.  Comes with 5 days of worsening pain and swelling in the lower abdomen    History provided by:  Patient Language interpreter used: No     Review of Systems  Constitutional:  Negative for chills, fatigue and fever.  Skin:  Positive for rash and wound.    Patient History  Allergies: No Known Allergies   Past Medical History:  Diagnosis Date  . Diabetes mellitus    History reviewed. No pertinent surgical history. Social History   Socioeconomic History  . Marital status: Single    Spouse name: Not on file  . Number of children: Not on file  . Years of education: Not on file  . Highest education level: Not on file  Occupational History  . Not on file  Tobacco Use  . Smoking status: Every Day    Current packs/day: 0.50    Types: Cigarettes  . Smokeless tobacco: Never  Vaping Use  . Vaping status: Never Used  Substance and Sexual Activity  . Alcohol use: Yes  . Drug use: Never  . Sexual activity: Yes  Other Topics Concern  . Not on file  Social History Narrative  . Not on file   History reviewed. No pertinent family history. Current Outpatient Medications on File Prior to Visit  Medication Sig Dispense Refill  . insulin  aspart (NovoLOG ) 100 UNIT/ML injection Inject 4 Units under the skin.    . insulin  glargine (Lantus ) 100 UNIT/ML injection Inject 18 Units under the skin.     No current facility-administered medications on file prior to visit.     Objective  Vitals:   09/21/23 1639  BP: 109/78  Pulse: (!) 116  Resp: 18  Temp: 36.8 C (98.2 F)  TempSrc: Oral  SpO2: 98%  Weight: 87.1 kg  Height: 5' 9  PainSc:   7                No results found.  Physical Exam Vitals and nursing note reviewed.  Constitutional:      Appearance: Normal appearance. He is normal weight.  Cardiovascular:     Rate and Rhythm: Normal rate and regular rhythm.     Pulses: Normal pulses.      Heart sounds: Normal heart sounds. No murmur heard.    No friction rub. No gallop.  Pulmonary:     Effort: Pulmonary effort is normal. No respiratory distress.     Breath sounds: Normal breath sounds. No stridor. No wheezing, rhonchi or rales.  Chest:     Chest wall: No tenderness.  Skin:         Comments: 2 cm area of fluctuance with tenderness surrounded by about 3cm of induration   Neurological:     General: No focal deficit present.     Mental Status: He is alert and oriented to person, place, and time. Mental status is at baseline.     Motor: No weakness.     Coordination: Coordination normal.     Gait: Gait normal.  Psychiatric:     Comments: Appearance: age appropriate attire, good hygiene, and normal grooming Behavior:no distress, cooperative, and appropriate Affect: congruent with mood Cognition: Alert and Oriented x 3, Judgement intact, and Insight intact Motor Functions: normal psychomotor speed and no involuntary movements Speech: normal verbilization, rate, volume, and tone Thought Content and Perception: Negative suicidal ideations, suicide attempt, auditory hallucinations, visual hallucinations  Thought Process: linear and goal directed  No results found for this visit on 09/21/23.   Incision and Drainage  Date/Time: 09/21/2023 4:45 PM  Performed by: Geofm Sar, PA Authorized by: Geofm Sar, PA   Consent:    Consent obtained:  Verbal   Consent given by:  Patient   Risks, benefits, and alternatives were discussed: yes     Risks discussed:  Bleeding, incomplete drainage, pain and infection   Alternatives discussed:  Referral Universal protocol:    Procedure explained and questions answered to patient or proxy's satisfaction: yes     Immediately prior to procedure, a time out was called: no     Patient identity confirmed:  Verbally with patient Location:    Type:  Abscess   Location:  Trunk   Trunk location:  Abdomen Pre-procedure details:     Skin preparation:  Povidone-iodine Sedation:    Sedation type:  None Anesthesia:    Anesthesia method:  Local infiltration   Local anesthetic:  Lidocaine  1% w/o epi Procedure type:    Complexity:  Complex Procedure details:    Ultrasound guidance: no     Needle aspiration: no     Incision types:  Single straight   Incision depth:  Submucosal   Wound management:  Probed and deloculated and irrigated with saline   Drainage:  Purulent   Drainage amount:  Moderate   Wound treatment:  Wound left open   Packing materials:  1/4 in iodoform gauze   Amount 1/4 iodoform:  12 in Post-procedure details:    Procedure completion:  Tolerated well, no immediate complications  MDM:     1 Acute illness with systemic symptoms     Explanation of Medical Decision Making and variances from expected care:  Abscess -- I and D in clinic today  Drained and packed today  Return to the clinic in 2 days for packing removal  Will also start him on antibiotics -- doxycycline -- for seven days Motrin  600-800mg  every 8 hours with 650mg  to 1000mg  of tylenol  for pain as needed     Assessment requiring historian other than patient: No     Independent visualization of image, tracing, or test: No     Discussion of management with another provider: No     Risk:: Moderate          Assessment/Plan Diagnoses and all orders for this visit:  Abscess -     doxycycline  (Vibramycin ) 100 MG capsule; Take 1 capsule (100 mg total) by mouth in the morning and 1 capsule (100 mg total) in the evening. Do all this for 10 days. Take with at least 8 ounces (large glass) of water , do not lie down for 30 minutes after.  Other orders -     Incision and Drainage         Progress note signed by Geofm Sar, PA on 09/21/23 at  8:21 PM

## 2024-05-28 ENCOUNTER — Inpatient Hospital Stay

## 2024-05-28 ENCOUNTER — Emergency Department

## 2024-05-28 ENCOUNTER — Inpatient Hospital Stay
Admission: EM | Admit: 2024-05-28 | Discharge: 2024-05-31 | DRG: 637 | Disposition: A | Discharge status: Home / Self Care | Attending: Internal Medicine | Admitting: Internal Medicine

## 2024-05-28 ENCOUNTER — Other Ambulatory Visit: Payer: Self-pay

## 2024-05-28 DIAGNOSIS — Z8249 Family history of ischemic heart disease and other diseases of the circulatory system: Secondary | ICD-10-CM

## 2024-05-28 DIAGNOSIS — R6511 Systemic inflammatory response syndrome (SIRS) of non-infectious origin with acute organ dysfunction: Secondary | ICD-10-CM | POA: Diagnosis present

## 2024-05-28 DIAGNOSIS — E872 Acidosis, unspecified: Secondary | ICD-10-CM | POA: Diagnosis present

## 2024-05-28 DIAGNOSIS — E875 Hyperkalemia: Secondary | ICD-10-CM | POA: Diagnosis present

## 2024-05-28 DIAGNOSIS — Z833 Family history of diabetes mellitus: Secondary | ICD-10-CM

## 2024-05-28 DIAGNOSIS — R112 Nausea with vomiting, unspecified: Secondary | ICD-10-CM | POA: Insufficient documentation

## 2024-05-28 DIAGNOSIS — Z794 Long term (current) use of insulin: Secondary | ICD-10-CM | POA: Diagnosis not present

## 2024-05-28 DIAGNOSIS — L989 Disorder of the skin and subcutaneous tissue, unspecified: Secondary | ICD-10-CM | POA: Diagnosis present

## 2024-05-28 DIAGNOSIS — D72829 Elevated white blood cell count, unspecified: Secondary | ICD-10-CM | POA: Diagnosis present

## 2024-05-28 DIAGNOSIS — N179 Acute kidney failure, unspecified: Secondary | ICD-10-CM | POA: Diagnosis present

## 2024-05-28 DIAGNOSIS — Z1152 Encounter for screening for COVID-19: Secondary | ICD-10-CM | POA: Diagnosis not present

## 2024-05-28 DIAGNOSIS — R079 Chest pain, unspecified: Secondary | ICD-10-CM | POA: Diagnosis present

## 2024-05-28 DIAGNOSIS — E871 Hypo-osmolality and hyponatremia: Secondary | ICD-10-CM | POA: Diagnosis present

## 2024-05-28 DIAGNOSIS — F17210 Nicotine dependence, cigarettes, uncomplicated: Secondary | ICD-10-CM | POA: Diagnosis present

## 2024-05-28 DIAGNOSIS — R739 Hyperglycemia, unspecified: Principal | ICD-10-CM

## 2024-05-28 DIAGNOSIS — E1010 Type 1 diabetes mellitus with ketoacidosis without coma: Principal | ICD-10-CM | POA: Diagnosis present

## 2024-05-28 DIAGNOSIS — E1110 Type 2 diabetes mellitus with ketoacidosis without coma: Secondary | ICD-10-CM | POA: Diagnosis not present

## 2024-05-28 DIAGNOSIS — E869 Volume depletion, unspecified: Secondary | ICD-10-CM | POA: Diagnosis present

## 2024-05-28 DIAGNOSIS — N509 Disorder of male genital organs, unspecified: Secondary | ICD-10-CM | POA: Insufficient documentation

## 2024-05-28 LAB — URINALYSIS, ROUTINE W REFLEX MICROSCOPIC
Bacteria, UA: NONE SEEN
Bilirubin Urine: NEGATIVE
Glucose, UA: >=500 mg/dL — AB
Ketones, ur: 80 mg/dL — AB
Leukocytes,Ua: NEGATIVE
Nitrite: NEGATIVE
Protein, ur: 30 mg/dL — AB
Specific Gravity, Urine: 1.018 (ref 1.005–1.030)
pH: 5 (ref 5.0–8.0)

## 2024-05-28 LAB — CBG MONITORING, ED
Glucose-Capillary: 344 mg/dL — ABNORMAL HIGH (ref 70–99)
Glucose-Capillary: 477 mg/dL — ABNORMAL HIGH (ref 70–99)
Glucose-Capillary: 522 mg/dL (ref 70–99)
Glucose-Capillary: 533 mg/dL (ref 70–99)
Glucose-Capillary: 552 mg/dL (ref 70–99)

## 2024-05-28 LAB — CBC
HCT: 47.6 % (ref 39.0–52.0)
Hemoglobin: 15.4 g/dL (ref 13.0–17.0)
MCH: 29.3 pg (ref 26.0–34.0)
MCHC: 32.4 g/dL (ref 30.0–36.0)
MCV: 90.5 fL (ref 80.0–100.0)
Platelets: 245 K/uL (ref 150–400)
RBC: 5.26 MIL/uL (ref 4.22–5.81)
RDW: 13.2 % (ref 11.5–15.5)
WBC: 11.5 K/uL — ABNORMAL HIGH (ref 4.0–10.5)
nRBC: 0 % (ref 0.0–0.2)

## 2024-05-28 LAB — BLOOD GAS, VENOUS
Acid-base deficit: 19.3 mmol/L — ABNORMAL HIGH (ref 0.0–2.0)
Acid-base deficit: 18 mmol/L — ABNORMAL HIGH (ref 0.0–2.0)
Bicarbonate: 9.9 mmol/L — ABNORMAL LOW (ref 20.0–28.0)
Bicarbonate: 8.6 mmol/L — ABNORMAL LOW (ref 20.0–28.0)
O2 Saturation: 63.4 %
O2 Saturation: 91.3 %
Patient temperature: 37
Patient temperature: 37
pCO2, Ven: 34 mmHg — ABNORMAL LOW (ref 44–60)
pCO2, Ven: 23 mmHg — ABNORMAL LOW (ref 44–60)
pH, Ven: 7.07 — CL (ref 7.25–7.43)
pH, Ven: 7.18 — CL (ref 7.25–7.43)
pO2, Ven: 40 mmHg (ref 32–45)
pO2, Ven: 61 mmHg — ABNORMAL HIGH (ref 32–45)

## 2024-05-28 LAB — BASIC METABOLIC PANEL WITH GFR
Anion gap: 28 — ABNORMAL HIGH (ref 5–15)
Anion gap: 26 — ABNORMAL HIGH (ref 5–15)
BUN: 28 mg/dL — ABNORMAL HIGH (ref 6–20)
BUN: 29 mg/dL — ABNORMAL HIGH (ref 6–20)
CO2: 9 mmol/L — ABNORMAL LOW (ref 22–32)
CO2: 8 mmol/L — ABNORMAL LOW (ref 22–32)
Calcium: 9.8 mg/dL (ref 8.9–10.3)
Calcium: 10 mg/dL (ref 8.9–10.3)
Chloride: 96 mmol/L — ABNORMAL LOW (ref 98–111)
Chloride: 99 mmol/L (ref 98–111)
Creatinine, Ser: 1.64 mg/dL — ABNORMAL HIGH (ref 0.61–1.24)
Creatinine, Ser: 1.73 mg/dL — ABNORMAL HIGH (ref 0.61–1.24)
GFR, Estimated: 58 mL/min — ABNORMAL LOW (ref >60)
GFR, Estimated: 55 mL/min — ABNORMAL LOW (ref >60)
Glucose, Bld: 584 mg/dL (ref 70–99)
Glucose, Bld: 589 mg/dL (ref 70–99)
Potassium: 7.1 mmol/L (ref 3.5–5.1)
Potassium: 7.3 mmol/L (ref 3.5–5.1)
Sodium: 133 mmol/L — ABNORMAL LOW (ref 135–145)
Sodium: 133 mmol/L — ABNORMAL LOW (ref 135–145)

## 2024-05-28 LAB — BETA-HYDROXYBUTYRIC ACID
Beta-Hydroxybutyric Acid: >8 mmol/L — ABNORMAL HIGH (ref 0.05–0.27)
Beta-Hydroxybutyric Acid: >8 mmol/L — ABNORMAL HIGH (ref 0.05–0.27)

## 2024-05-28 LAB — TROPONIN I (HIGH SENSITIVITY)
Troponin I (High Sensitivity): 4 ng/L (ref <18)
Troponin I (High Sensitivity): 5 ng/L (ref <18)

## 2024-05-28 MED ORDER — ACETAMINOPHEN 325 MG PO TABS: 650.0000 mg | ORAL_TABLET | Freq: Four times a day (QID) | ORAL | Status: DC | PRN

## 2024-05-28 MED ORDER — DEXTROSE IN LACTATED RINGERS 5 % IV SOLN: INTRAVENOUS | Status: AC

## 2024-05-28 MED ORDER — DEXTROSE 50 % IV SOLN: 0.0000 mL | INTRAVENOUS | Status: DC | PRN

## 2024-05-28 MED ORDER — POLYETHYLENE GLYCOL 3350 17 G PO PACK: 17.0000 g | PACK | Freq: Every day | ORAL | Status: DC | PRN

## 2024-05-28 MED ORDER — INSULIN REGULAR(HUMAN) IN NACL 100-0.9 UT/100ML-% IV SOLN
INTRAVENOUS | Status: DC
  Administered 2024-05-28: 9 [IU]/h via INTRAVENOUS
  Administered 2024-05-29: 1.3 [IU]/h via INTRAVENOUS
  Filled 2024-05-28 (×2): qty 100

## 2024-05-28 MED ORDER — ONDANSETRON HCL 4 MG PO TABS
4.0000 mg | ORAL_TABLET | Freq: Four times a day (QID) | ORAL | Status: DC | PRN
Start: 2024-05-28 — End: 2024-05-31

## 2024-05-28 MED ORDER — LACTATED RINGERS IV SOLN: INTRAVENOUS | Status: AC

## 2024-05-28 MED ORDER — ENOXAPARIN SODIUM 40 MG/0.4ML IJ SOSY
40.0000 mg | PREFILLED_SYRINGE | INTRAMUSCULAR | Status: DC
  Administered 2024-05-28 – 2024-05-30 (×3): 40 mg via SUBCUTANEOUS
  Filled 2024-05-28 (×3): qty 0.4

## 2024-05-28 MED ORDER — ACETAMINOPHEN 650 MG RE SUPP: 650.0000 mg | Freq: Four times a day (QID) | RECTAL | Status: DC | PRN

## 2024-05-28 MED ORDER — ONDANSETRON HCL 4 MG/2ML IJ SOLN
4.0000 mg | Freq: Four times a day (QID) | INTRAMUSCULAR | Status: DC | PRN
  Administered 2024-05-29 – 2024-05-31 (×3): 4 mg via INTRAVENOUS
  Filled 2024-05-28 (×3): qty 2

## 2024-05-28 MED ORDER — SODIUM BICARBONATE 8.4 % IV SOLN
50.0000 meq | Freq: Once | INTRAVENOUS | Status: AC
  Administered 2024-05-28: 50 meq via INTRAVENOUS
  Filled 2024-05-28: qty 50

## 2024-05-28 MED ORDER — LACTATED RINGERS IV BOLUS
2000.0000 mL | Freq: Once | INTRAVENOUS | Status: AC
  Administered 2024-05-28: 2000 mL via INTRAVENOUS

## 2024-05-28 MED ORDER — CALCIUM GLUCONATE 10 % IV SOLN
1.0000 g | Freq: Once | INTRAVENOUS | Status: AC
  Administered 2024-05-28: 1 g via INTRAVENOUS
  Filled 2024-05-28: qty 10

## 2024-05-28 MED ORDER — SODIUM ZIRCONIUM CYCLOSILICATE 5 G PO PACK
10.0000 g | PACK | Freq: Three times a day (TID) | ORAL | Status: DC
  Administered 2024-05-28: 10 g via ORAL
  Filled 2024-05-28: qty 1

## 2024-05-28 MED ORDER — SODIUM ZIRCONIUM CYCLOSILICATE 10 G PO PACK
10.0000 g | PACK | Freq: Once | ORAL | Status: AC
  Administered 2024-05-28: 10 g via ORAL
  Filled 2024-05-28: qty 1

## 2024-05-28 NOTE — ED Provider Notes (Signed)
 Wesley [name] Provider Note    Event Date/Time   First MD Initiated Contact with Patient 05/28/24 1940     (approximate)   History   Hyperglycemia   HPI  Wesley [name] is a 28 y.o. male with history of diabetes, presenting with elevated blood glucose.  States that he has not missed any doses of insulin  but has been feeling very drained and tired recently.  Is concerned that this is a symptom of potential DKA.  Had an episode of nausea vomiting today.  Also noted some chest pain.  States the chest pain is feels like burning, started after he had an episode of nausea vomiting today.  He has no cough or other urinary symptoms, no diarrhea.  No abdominal pain or back pain.  No shortness of breath.  States that he tends to go into DKA when he is stressed, did endorse decreased p.o. intake recently.  Independent history from EMS, he was feeling generalized weakness and increased fatigue, vitals were normal for them, blood glucose was 479.  He is a type I diabetic.  On independent chart review, he was admitted in 2024 for DKA.Wesley  At that time presented with AKI as well.  Symptoms improved after insulin  drip and electrolyte repletion.     Physical Exam   Triage Vital Signs: ED Triage Vitals  Encounter Vitals Group     BP 05/28/24 1915 (!) 152/87     Girls Systolic BP Percentile --      Girls Diastolic BP Percentile --      Boys Systolic BP Percentile --      Boys Diastolic BP Percentile --      Pulse Rate 05/28/24 1915 (!) 106     Resp 05/28/24 1915 18     Temp 05/28/24 1915 97.8 F (36.6 C)     Temp Source 05/28/24 1915 Oral     SpO2 05/28/24 1915 100 %     Weight --      Height --      Head Circumference --      Peak Flow --      Pain Score 05/28/24 1916 4     Pain Loc --      Pain Education --      Exclude from Growth Chart --     Most recent vital signs: Vitals:   05/28/24 1915 05/28/24 1940  BP: (!) 152/87 (!) 143/82  Pulse: (!) 106 (!) 111   Resp: 18 20  Temp: 97.8 F (36.6 C)   SpO2: 100% 100%     General: Awake, no distress.  CV:  Good peripheral perfusion.  Resp:  Normal effort.  No tachypnea or respiratory distress, Abd:  No distention.  Soft nontender Other:  Dry mucous membranes   ED Results / Procedures / Treatments   Labs (all labs ordered are listed, but only abnormal results are displayed) Labs Reviewed  BASIC METABOLIC PANEL WITH GFR - Abnormal; Notable for the following components:      Result Value   Sodium 133 (*)    Potassium 7.1 (*)    Chloride 96 (*)    CO2 9 (*)    Glucose, Bld 584 (*)    BUN 28 (*)    Creatinine, Ser 1.64 (*)    GFR, Estimated 58 (*)    Anion gap 28 (*)    All other components within normal limits  CBC - Abnormal; Notable for the following components:   WBC 11.5 (*)  All other components within normal limits  URINALYSIS, ROUTINE W REFLEX MICROSCOPIC - Abnormal; Notable for the following components:   Color, Urine STRAW (*)    APPearance CLEAR (*)    Glucose, UA >=500 (*)    Hgb urine dipstick MODERATE (*)    Ketones, ur 80 (*)    Protein, ur 30 (*)    All other components within normal limits  BLOOD GAS, VENOUS - Abnormal; Notable for the following components:   pH, Ven 7.07 (*)    pCO2, Ven 34 (*)    Bicarbonate 9.9 (*)    Acid-base deficit 19.3 (*)    All other components within normal limits  CBG MONITORING, ED - Abnormal; Notable for the following components:   Glucose-Capillary 552 (*)    All other components within normal limits  CBG MONITORING, ED - Abnormal; Notable for the following components:   Glucose-Capillary 522 (*)    All other components within normal limits  BETA-HYDROXYBUTYRIC ACID  BASIC METABOLIC PANEL WITH GFR  BASIC METABOLIC PANEL WITH GFR  BASIC METABOLIC PANEL WITH GFR  BASIC METABOLIC PANEL WITH GFR  BETA-HYDROXYBUTYRIC ACID  BETA-HYDROXYBUTYRIC ACID  BETA-HYDROXYBUTYRIC ACID  BETA-HYDROXYBUTYRIC ACID  TROPONIN I (HIGH  SENSITIVITY)  TROPONIN I (HIGH SENSITIVITY)     EKG  EKG shows, sinus tachycardia, rate 107, normal QRS, normal QTc, T wave inversion to aVL, no obvious ischemic ST elevation, peaked T waves noted, T waves are peaked in the past but this is more peaked compared to prior.   RADIOLOGY On my independent interpretation, chest x-ray without obvious consolidation   PROCEDURES:  Critical Care performed: Yes, see critical care procedure note(s)  .Critical Care  Performed by: Waymond Lorelle Cummins, MD Authorized by: Waymond Lorelle Cummins, MD   Critical care provider statement:    Critical care time (minutes):  45   Critical care was necessary to treat or prevent imminent or life-threatening deterioration of the following conditions:  Endocrine crisis and metabolic crisis   Critical care was time spent personally by me on the following activities:  Development of treatment plan with patient or surrogate, discussions with consultants, evaluation of patient's response to treatment, examination of patient, ordering and review of laboratory studies, ordering and review of radiographic studies, ordering and performing treatments and interventions, pulse oximetry, re-evaluation of patient's condition and review of old charts    MEDICATIONS ORDERED IN ED: Medications  insulin  regular, human (MYXREDLIN ) 100 units/ 100 mL infusion (has no administration in time range)  lactated ringers  infusion (0 mLs Intravenous Hold 05/28/24 2010)  dextrose  5 % in lactated ringers  infusion (0 mLs Intravenous Hold 05/28/24 2011)  dextrose  50 % solution 0-50 mL (has no administration in time range)  lactated ringers  bolus 2,000 mL (2,000 mLs Intravenous New Bag/Given 05/28/24 2008)  calcium  gluconate inj 10% (1 g) URGENT USE ONLY! (1 g Intravenous Given 05/28/24 2038)  sodium zirconium cyclosilicate  (LOKELMA ) packet 10 g (10 g Oral Given 05/28/24 2028)     IMPRESSION / MDM / ASSESSMENT AND PLAN / ED COURSE  I reviewed the triage  vital signs and the nursing notes.                              Differential diagnosis includes, but is not limited to, glycemia, DKA, electrolyte derangement, dehydration, viral illness.  For the chest pain, considered GERD, gastritis, acid reflux, angina, ACS.  Get labs, EKG, troponin, chest x-ray, VBG, beta hydroxybutyrate.  IV fluids.  Patient's presentation is most consistent with acute presentation with potential threat to life or bodily function.  Independent interpretation of labs and imaging below.  Labs are consistent with DKA he is also hyperkalemic, started him on insulin  drip, added calcium  gluconate as well as Lokelma  for the hyper-K in T waves seen on EKG.  Consulted hospitalist for admission and he will evaluate the patient.  Patient is admitted.  The patient is on the cardiac monitor to evaluate for evidence of arrhythmia and/or significant heart rate changes.   Clinical Course as of 05/28/24 2113  Sat May 28, 2024  8057 Independent review of labs, mild leukocytosis, he has acidosis on his VBG, glucose is elevated, UA is not consistent with UTI.  Patient is also hyperkalemic, hyperglycemic, acidosis with anion gap of 28, has an AKI. [TT]  2052 DG Chest 1 View No active disease.  [TT]    Clinical Course User Index [TT] Waymond Lorelle Cummins, MD     FINAL CLINICAL IMPRESSION(S) / ED DIAGNOSES   Final diagnoses:  Hyperglycemia  AKI (acute kidney injury) (HCC)  Diabetic ketoacidosis without coma associated with type 1 diabetes mellitus (HCC)  Hyperkalemia     Rx / DC Orders   ED Discharge Orders     None        Note:  This document was prepared using Dragon voice recognition software and may include unintentional dictation errors.    Waymond Lorelle Cummins, MD 05/28/24 2113

## 2024-05-28 NOTE — ED Triage Notes (Signed)
 BIB ems for hyperglycemia, type 1 cbg 479 with ems. Feels weak and tired.  VS wnl with ems

## 2024-05-28 NOTE — ED Triage Notes (Signed)
 Pt presented to ED BIBA with c/o hyperglycemia, states I think I am going into DKA. States I feel drained, tired. Symptoms starting today. Pt denies missed doses of insulin . States unable to check sugars at home due to not having a meter. Endorses 1 episode of vomiting this AM. Pt also endorses chest pain, 4/10.

## 2024-05-28 NOTE — ED Notes (Signed)
 Admitting provider at patient bedside.

## 2024-05-28 NOTE — ED Notes (Signed)
 Xray tech with portable at patient bedside.

## 2024-05-29 ENCOUNTER — Inpatient Hospital Stay

## 2024-05-29 DIAGNOSIS — E1110 Type 2 diabetes mellitus with ketoacidosis without coma: Secondary | ICD-10-CM | POA: Diagnosis not present

## 2024-05-29 DIAGNOSIS — E875 Hyperkalemia: Secondary | ICD-10-CM

## 2024-05-29 DIAGNOSIS — N509 Disorder of male genital organs, unspecified: Secondary | ICD-10-CM | POA: Insufficient documentation

## 2024-05-29 DIAGNOSIS — E871 Hypo-osmolality and hyponatremia: Secondary | ICD-10-CM | POA: Insufficient documentation

## 2024-05-29 DIAGNOSIS — R112 Nausea with vomiting, unspecified: Secondary | ICD-10-CM | POA: Insufficient documentation

## 2024-05-29 DIAGNOSIS — D72829 Elevated white blood cell count, unspecified: Secondary | ICD-10-CM | POA: Insufficient documentation

## 2024-05-29 DIAGNOSIS — R079 Chest pain, unspecified: Secondary | ICD-10-CM | POA: Insufficient documentation

## 2024-05-29 LAB — BASIC METABOLIC PANEL WITH GFR
Anion gap: 18 — ABNORMAL HIGH (ref 5–15)
Anion gap: 16 — ABNORMAL HIGH (ref 5–15)
Anion gap: 14 (ref 5–15)
Anion gap: 16 — ABNORMAL HIGH (ref 5–15)
Anion gap: 15 (ref 5–15)
BUN: 25 mg/dL — ABNORMAL HIGH (ref 6–20)
BUN: 23 mg/dL — ABNORMAL HIGH (ref 6–20)
BUN: 20 mg/dL (ref 6–20)
BUN: 18 mg/dL (ref 6–20)
BUN: 14 mg/dL (ref 6–20)
CO2: 14 mmol/L — ABNORMAL LOW (ref 22–32)
CO2: 14 mmol/L — ABNORMAL LOW (ref 22–32)
CO2: 19 mmol/L — ABNORMAL LOW (ref 22–32)
CO2: 17 mmol/L — ABNORMAL LOW (ref 22–32)
CO2: 18 mmol/L — ABNORMAL LOW (ref 22–32)
Calcium: 9.7 mg/dL (ref 8.9–10.3)
Calcium: 9.5 mg/dL (ref 8.9–10.3)
Calcium: 9.7 mg/dL (ref 8.9–10.3)
Calcium: 9.8 mg/dL (ref 8.9–10.3)
Calcium: 9.6 mg/dL (ref 8.9–10.3)
Chloride: 108 mmol/L (ref 98–111)
Chloride: 110 mmol/L (ref 98–111)
Chloride: 108 mmol/L (ref 98–111)
Chloride: 109 mmol/L (ref 98–111)
Chloride: 109 mmol/L (ref 98–111)
Creatinine, Ser: 1.53 mg/dL — ABNORMAL HIGH (ref 0.61–1.24)
Creatinine, Ser: 1.11 mg/dL (ref 0.61–1.24)
Creatinine, Ser: 1.19 mg/dL (ref 0.61–1.24)
Creatinine, Ser: 1.24 mg/dL (ref 0.61–1.24)
Creatinine, Ser: 0.95 mg/dL (ref 0.61–1.24)
GFR, Estimated: >60 mL/min (ref >60)
GFR, Estimated: >60 mL/min (ref >60)
GFR, Estimated: >60 mL/min (ref >60)
GFR, Estimated: >60 mL/min (ref >60)
GFR, Estimated: >60 mL/min (ref >60)
Glucose, Bld: 248 mg/dL — ABNORMAL HIGH (ref 70–99)
Glucose, Bld: 205 mg/dL — ABNORMAL HIGH (ref 70–99)
Glucose, Bld: 189 mg/dL — ABNORMAL HIGH (ref 70–99)
Glucose, Bld: 154 mg/dL — ABNORMAL HIGH (ref 70–99)
Glucose, Bld: 170 mg/dL — ABNORMAL HIGH (ref 70–99)
Potassium: 4.6 mmol/L (ref 3.5–5.1)
Potassium: 4.5 mmol/L (ref 3.5–5.1)
Potassium: 4.4 mmol/L (ref 3.5–5.1)
Potassium: 4.4 mmol/L (ref 3.5–5.1)
Potassium: 3.8 mmol/L (ref 3.5–5.1)
Sodium: 140 mmol/L (ref 135–145)
Sodium: 140 mmol/L (ref 135–145)
Sodium: 141 mmol/L (ref 135–145)
Sodium: 142 mmol/L (ref 135–145)
Sodium: 142 mmol/L (ref 135–145)

## 2024-05-29 LAB — GLUCOSE, CAPILLARY
Glucose-Capillary: 135 mg/dL — ABNORMAL HIGH (ref 70–99)
Glucose-Capillary: 136 mg/dL — ABNORMAL HIGH (ref 70–99)
Glucose-Capillary: 144 mg/dL — ABNORMAL HIGH (ref 70–99)
Glucose-Capillary: 161 mg/dL — ABNORMAL HIGH (ref 70–99)
Glucose-Capillary: 164 mg/dL — ABNORMAL HIGH (ref 70–99)
Glucose-Capillary: 165 mg/dL — ABNORMAL HIGH (ref 70–99)
Glucose-Capillary: 168 mg/dL — ABNORMAL HIGH (ref 70–99)
Glucose-Capillary: 169 mg/dL — ABNORMAL HIGH (ref 70–99)
Glucose-Capillary: 170 mg/dL — ABNORMAL HIGH (ref 70–99)
Glucose-Capillary: 183 mg/dL — ABNORMAL HIGH (ref 70–99)
Glucose-Capillary: 185 mg/dL — ABNORMAL HIGH (ref 70–99)
Glucose-Capillary: 193 mg/dL — ABNORMAL HIGH (ref 70–99)
Glucose-Capillary: 196 mg/dL — ABNORMAL HIGH (ref 70–99)
Glucose-Capillary: 196 mg/dL — ABNORMAL HIGH (ref 70–99)
Glucose-Capillary: 197 mg/dL — ABNORMAL HIGH (ref 70–99)
Glucose-Capillary: 221 mg/dL — ABNORMAL HIGH (ref 70–99)
Glucose-Capillary: 224 mg/dL — ABNORMAL HIGH (ref 70–99)
Glucose-Capillary: 245 mg/dL — ABNORMAL HIGH (ref 70–99)
Glucose-Capillary: 279 mg/dL — ABNORMAL HIGH (ref 70–99)
Glucose-Capillary: 291 mg/dL — ABNORMAL HIGH (ref 70–99)

## 2024-05-29 LAB — CBC
HCT: 44.6 % (ref 39.0–52.0)
HCT: 43.6 % (ref 39.0–52.0)
Hemoglobin: 14.7 g/dL (ref 13.0–17.0)
Hemoglobin: 14.8 g/dL (ref 13.0–17.0)
MCH: 29.3 pg (ref 26.0–34.0)
MCH: 29.2 pg (ref 26.0–34.0)
MCHC: 33 g/dL (ref 30.0–36.0)
MCHC: 33.9 g/dL (ref 30.0–36.0)
MCV: 88.8 fL (ref 80.0–100.0)
MCV: 86.2 fL (ref 80.0–100.0)
Platelets: 236 K/uL (ref 150–400)
Platelets: 231 K/uL (ref 150–400)
RBC: 5.02 MIL/uL (ref 4.22–5.81)
RBC: 5.06 MIL/uL (ref 4.22–5.81)
RDW: 13.1 % (ref 11.5–15.5)
RDW: 13.2 % (ref 11.5–15.5)
WBC: 17.2 K/uL — ABNORMAL HIGH (ref 4.0–10.5)
WBC: 15.1 K/uL — ABNORMAL HIGH (ref 4.0–10.5)
nRBC: 0.1 % (ref 0.0–0.2)
nRBC: 0 % (ref 0.0–0.2)

## 2024-05-29 LAB — BETA-HYDROXYBUTYRIC ACID
Beta-Hydroxybutyric Acid: 7.74 mmol/L — ABNORMAL HIGH (ref 0.05–0.27)
Beta-Hydroxybutyric Acid: 5.87 mmol/L — ABNORMAL HIGH (ref 0.05–0.27)
Beta-Hydroxybutyric Acid: 4.46 mmol/L — ABNORMAL HIGH (ref 0.05–0.27)
Beta-Hydroxybutyric Acid: 3.48 mmol/L — ABNORMAL HIGH (ref 0.05–0.27)
Beta-Hydroxybutyric Acid: 3.43 mmol/L — ABNORMAL HIGH (ref 0.05–0.27)

## 2024-05-29 LAB — MAGNESIUM: Magnesium: 2 mg/dL (ref 1.7–2.4)

## 2024-05-29 LAB — HIV ANTIBODY (ROUTINE TESTING W REFLEX): HIV Screen 4th Generation wRfx: NONREACTIVE

## 2024-05-29 LAB — PHOSPHORUS: Phosphorus: 2.2 mg/dL — ABNORMAL LOW (ref 2.5–4.6)

## 2024-05-29 MED ORDER — OXYCODONE HCL 5 MG PO TABS
5.0000 mg | ORAL_TABLET | ORAL | Status: DC | PRN
  Administered 2024-05-30: 5 mg via ORAL
  Filled 2024-05-29: qty 1

## 2024-05-29 MED ORDER — DEXTROSE IN LACTATED RINGERS 5 % IV SOLN
INTRAVENOUS | Status: AC
  Administered 2024-05-30: 125 mL/h via INTRAVENOUS

## 2024-05-29 MED ORDER — SODIUM BICARBONATE 650 MG PO TABS
650.0000 mg | ORAL_TABLET | Freq: Three times a day (TID) | ORAL | Status: DC
  Administered 2024-05-29 – 2024-05-31 (×5): 650 mg via ORAL
  Filled 2024-05-29 (×7): qty 1

## 2024-05-29 MED ORDER — ACETAMINOPHEN 325 MG PO TABS
650.0000 mg | ORAL_TABLET | Freq: Four times a day (QID) | ORAL | Status: DC | PRN
Start: 2024-05-29 — End: 2024-05-31

## 2024-05-29 MED ORDER — ACETAMINOPHEN 650 MG RE SUPP: 650.0000 mg | Freq: Four times a day (QID) | RECTAL | Status: DC | PRN

## 2024-05-29 MED ORDER — CHLORHEXIDINE GLUCONATE CLOTH 2 % EX PADS
6.0000 | MEDICATED_PAD | Freq: Every day | CUTANEOUS | Status: DC
  Administered 2024-05-29: 6 via TOPICAL

## 2024-05-29 MED ORDER — METOCLOPRAMIDE HCL 5 MG/ML IJ SOLN
10.0000 mg | Freq: Three times a day (TID) | INTRAMUSCULAR | Status: AC
  Administered 2024-05-29 (×2): 10 mg via INTRAVENOUS
  Filled 2024-05-29 (×2): qty 2

## 2024-05-29 MED ORDER — PROCHLORPERAZINE EDISYLATE 10 MG/2ML IJ SOLN
10.0000 mg | Freq: Four times a day (QID) | INTRAMUSCULAR | Status: DC | PRN
  Administered 2024-05-29: 10 mg via INTRAVENOUS
  Filled 2024-05-29: qty 2

## 2024-05-29 NOTE — TOC CM/SW Note (Signed)
..  Transition of Care [facility]) - Inpatient Brief Assessment   Patient Details  Name: [name] MRN: [id number] Date of Birth: August 21, 1996  Transition of Care [facility]) CM/SW Contact:    Edsel DELENA Fischer, LCSW Phone Number: 05/29/2024, 4:03 PM   Clinical Narrative:  SW went to visit pt.  Pt and love one were asleep. TOC to follow up at a later time  Transition of Care Asessment:

## 2024-05-29 NOTE — Progress Notes (Signed)
 Progress Note   Patient: Wesley [name] FMW:[id number] DOB: 1996/07/03 DOA: 05/28/2024     1 DOS: the patient was seen and examined on 05/29/2024   Brief hospital course: From HPI Mr. Eckard is a 28 year old gentleman with a past medical history of type 1 diabetes mellitus with previous admissions for diabetic ketoacidosis presented to the emergency department today with nausea and vomiting feeling generally unwell and some chest pain patient reports that this is similar to how he felt previously he had diabetic ketoacidosis.  The patient reports compliance to his home insulin  regimen which she reports is 70/30 insulin  30 units twice daily (different than the medication reconciliation of Lantus  18 and 3 units of aspart with meals due to financial reasons).   He denies any current chest pain he denies any infective symptoms on his skin anywhere any fever or chills although does report he has some concerns regarding lesions on his scrotum over the past year she was examined as per the patient's request.  The bedside nurse was used as a chaperone.   Past medical records reviewed and summarized:  Patient had a fast med visit 09/21/2023 for an axilla abscess had I&D and then was discharged on antibiotics DKA admission discharged 04/15/2023 following inpatient admission for DKA following taking of less insulin  than prescribed   Assessment and Plan:  DKA (diabetic ketoacidosis) (HCC) Diabetic ketoacidosis Patient presents with a glucose in the high 500s, pH 7.07 positive ketones and ion gap of 28 Anion gap is still being Continue IV fluid as well as insulin  according to DKA protocol We will switch to subcutaneous insulin  when anion gap is closed   AKI (acute kidney injury) (HCC) Continue IV fluid Avoid nephrotoxic medications   Hyperkalemia Continue management of DKA protocol   Metabolic acidosis Metabolic acidosis Likely primarily driven from his DKA given the degree of hyperkalemia-given  IV sodium bicarb to drive intracellular as well as started oral bicarb, continue to follow his CO2 on BMP and repeat VBG as needed as DKA is treated, suspect that this will improve significantly with treatment of his underlying DKA     Scrotal skin lesion 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 Scrotal ultrasound ordered given the patient feels like some of these are intra scrotal Outpatient urology and/or dermatology follow-up     Chest pain     Chest pain Troponins negative and ECG without any acute ST changes     Nausea & vomiting   Nausea and vomiting Likely symptomatic to the patient's DKA ordering symptomatic relief   Leukocytosis Leukocytosis Denies any localizing infective symptoms believe this is likely reactive   Pseudohyponatremia Continue current IV fluid and manage DKA as above        Advance Care Planning:   Code Status: Full Code    Consults: None   Family Communication: Nil as per patient   Subjective:  Patient seen and examined at bedside this morning Admits to nausea but no vomiting abdominal pain no chest pain  Physical Exam:  General: Young male laying in bed in no acute distress Respiratory:    Respiratory Effort Normal:  Cardiovascular:  Regular Regular tachycardia without any added sounds or murmurs Gastrointestinal: Abdomen soft and nontender without palpable masses Psychiatric: Normal mood CNS: Alert and oriented x 3  Vitals:   05/29/24 1300 05/29/24 1400 05/29/24 1500 05/29/24 1600  BP: 135/87 133/89 (!) 131/98 (!) 122/92  Pulse: 99 95 (!) 104 (!) 116  Resp: (!) 21 15 (!)  22 17  Temp:      TempSrc:      SpO2: 96% 100% 98% 100%  Weight:      Height:        Data Reviewed: Chest x-ray reviewed that did not show any acute pathology    Latest Ref Rng & Units 05/29/2024    7:56 AM 05/29/2024    1:55 AM 05/28/2024    7:24 PM  CBC  WBC 4.0 - 10.5 K/uL 15.1  17.2  11.5   Hemoglobin 13.0 - 17.0 g/dL  85.1  85.2  84.5   Hematocrit 39.0 - 52.0 % 43.6  44.6  47.6   Platelets 150 - 400 K/uL 231  236  245        Latest Ref Rng & Units 05/29/2024   12:22 PM 05/29/2024    7:56 AM 05/29/2024    4:53 AM  BMP  Glucose 70 - 99 mg/dL 845  810  794   BUN 6 - 20 mg/dL 18  20  23    Creatinine 0.61 - 1.24 mg/dL 8.75  8.80  8.88   Sodium 135 - 145 mmol/L 142  141  140   Potassium 3.5 - 5.1 mmol/L 4.4  4.4  4.5   Chloride 98 - 111 mmol/L 109  108  110   CO2 22 - 32 mmol/L 17  19  14    Calcium  8.9 - 10.3 mg/dL 9.8  9.7  9.5       Time spent: 51 minutes  Author: Drue ONEIDA Potter, MD 05/29/2024 5:18 PM  For on call review [email or web address].

## 2024-05-29 NOTE — Assessment & Plan Note (Signed)
 Leukocytosis Denies any localizing infective symptoms believe this is likely reactive, to follow

## 2024-05-29 NOTE — Plan of Care (Signed)

## 2024-05-29 NOTE — H&P (Signed)
 History and Physical    Patient: Wesley [name] FMW:[id number] DOB: [birth year] DOA: 05/28/2024 DOS: the patient was seen and examined on 05/28/2024 [facility]: [facility]  Patient coming from: Home  Chief Complaint:  Chief Complaint  Patient presents with   Hyperglycemia   HPI:  Wesley [name] is a 28 year old gentleman with a past medical history of type 1 diabetes mellitus with previous admissions for diabetic ketoacidosis presented to the emergency department today with nausea and vomiting feeling generally unwell and some chest pain patient reports that this is similar to how he felt previously he had diabetic ketoacidosis.  The patient reports compliance to his home insulin  regimen which she reports is 70/30 insulin  30 units twice daily (different than the medication reconciliation of Lantus  18 and 3 units of aspart with meals due to financial reasons).  He denies any current chest pain he denies any infective symptoms on his skin anywhere any fever or chills although does report he has some concerns regarding lesions on his scrotum over the past year she was examined as per the patient's request.  The bedside nurse was used as a chaperone.  Past medical records reviewed and summarized:  Patient had a fast med visit 09/21/2023 for an axilla abscess had I&D and then was discharged on antibiotics DKA admission discharged 04/15/2023 following inpatient admission for DKA following taking of less insulin  than prescribed  Review of Systems:  Constitutional: Denies Weight Loss, Fever, Chills or Night Sweats Eyes: Denies Blurry Vision, Eye Pain or Decreased Vision ENT: Denies Sore Throat , Tinnitus, Hearing Loss Respiratory: Denies Shortness of Breath, Cough, Hemoptysis, Wheezing, Pleurisy Cardiovascular: Reports Chest Pain but no Paroxsymal Nocturnal Dyspnea, Palpitations, Edema Gastrointestinal: Denies Nausea, Vomiting, Diarrhea, Hematemesis, Melena Genitourinary: Denies  hematuria, dysuria, hesitancy, incontinence Hem/Lymphatic: Denies Easy Bruising, Blood Clots Allergy/Immun: Denies history of Hay Fever, Positive PPD or Hives All other systems were reviewed and are negative   Past Medical History:  Diagnosis Date   Diabetes mellitus without complication (HCC)    type 1 diabetes   DKA, type 1 (HCC)    Past Surgical History:  Procedure Laterality Date   HAND SURGERY     INCISION AND DRAINAGE ABSCESS N/A 10/02/2015   Procedure: INCISION AND DRAINAGE ABSCESS;  Surgeon: Donnice Brooks, MD;  Location: [facility] ORS;  Service: Urology;  Laterality: N/A;   INCISION AND DRAINAGE ABSCESS N/A 07/18/2019   Procedure: INCISION AND DRAINAGE SCROTAL ABSCESS;  Surgeon: Twylla Glendia BROCKS, MD;  Location: [facility] ORS;  Service: Urology;  Laterality: N/A;   KNEE ARTHROSCOPY WITH MEDIAL MENISECTOMY Right 03/17/2019   Procedure: KNEE ARTHROSCOPY WITH MEDIAL MENISECTOMY, POSSIBLE CHONDROPLASTY RIGHT - DIABETIC;  Surgeon: Tobie Priest, MD;  Location: [facility] ORS;  Service: Orthopedics;  Laterality: Right;   TONSILLECTOMY     TONSILLECTOMY AND ADENOIDECTOMY     UMBILICAL HERNIA REPAIR     Social History:  reports that he has been smoking cigarettes. He has a 2 pack-year smoking history. He has never used smokeless tobacco. He reports current alcohol use. He reports that he does not currently use drugs after having used the following drugs: Marijuana.  No Known Allergies  Family History  Problem Relation Age of Onset   Diabetes Mellitus II Paternal Grandfather    Hypertension Mother     Prior to Admission medications   Medication Sig Start Date End Date Taking? Authorizing Provider  insulin  regular (NOVOLIN R) 100 units/mL injection Inject 30 Units into the skin 2 (two) times daily in the  am and at bedtime..   Yes [provider]  cephALEXin  (KEFLEX ) 500 MG capsule Take 1 capsule (500 mg total) by mouth 2 (two) times daily. Patient not taking: Reported on 05/28/2024 08/04/23    Arlander Charleston, MD  doxycycline  (VIBRAMYCIN ) 100 MG capsule Take 1 capsule (100 mg total) by mouth 2 (two) times daily. Patient not taking: Reported on 05/28/2024 04/19/23   Cleotilde Rogue, MD  insulin  aspart (NOVOLOG ) 100 UNIT/ML FlexPen Inject 4 Units into the skin 3 (three) times daily with meals. 04/15/23 05/15/23  Jens Durand, MD  insulin  glargine (LANTUS ) 100 UNIT/ML Solostar Pen Inject 18 Units into the skin at bedtime. Patient not taking: Reported on 05/28/2024 04/16/23   Jens Durand, MD  naproxen  (NAPROSYN ) 500 MG tablet Take 1 tablet (500 mg total) by mouth 2 (two) times daily with a meal. Patient not taking: Reported on 05/28/2024 04/19/23   Cleotilde Rogue, MD    Physical Exam: Vitals:   05/29/24 0030 05/29/24 0045 05/29/24 0100 05/29/24 0115  BP: 113/75 133/83 130/79 128/76  Pulse: (!) 146 (!) 119 (!) 118 (!) 123  Resp: (!) 25 19 (!) 28 (!) 21  Temp:      TempSrc:      SpO2: 97% 100% 100% 98%  Weight:      Height:      Patient seen approx 9:20 PM 05/28/2024 ED room 7 Constitutional:  Vital Signs as per [facility] than three noted] No Acute Distress Eyes:  Pink Conjunctiva and no Ptosis ENMT:   External Appearance of Ears and Nose without obvious deformity, masses or scar Neck:     Trachea Midline, Neck Symmetric             Thyroid  without tenderness, palpable masses or nodules Respiratory:   Respiratory Effort Normal: No Use of Respiratory Muscles,No  Intercostal Retractions             Lungs Clear to Auscultation Bilaterally Cardiovascular:   Heart Auscultated: Regular Regular tachycardia without any added sounds or murmurs              No Lower Extremity Edema Gastrointestinal:  Abdomen soft and nontender without palpable masses, guarding or rebound  No Palpable Splenomegaly or Hepatomegaly Lymphatic:  No Palpable Cervical Lymphadenopathy or Palpable No Axillary Lymphadenopathy Psychiatric:  Patient Orientated to Time, Place and Person Patient with appropriate mood  and affect Recent and Remote Memory Intact GU: Multiple scrotal papules seen appears superficial on skin although patient reports feels intrascrotal no overlying skin changes or features of epididymitis  Data Reviewed: Labs, Radiology, ECG as detailed in HPI and A/P   Assessment and Plan: * DKA (diabetic ketoacidosis) (HCC) Diabetic ketoacidosis Patient presents with a glucose in the high 500s, pH 7.07 positive ketones and ion gap of 28 Patient has received a 2 L crystalloid bolus Plan: Endo tool DKA protocol IV insulin  infusion, every 4 hourly BMP follow for ketone clearance and improving acid-base status, will bridge when appropriate, continue IV crystalloid at 200 mL/h, diabetes educator and TOC consultation given barriers to medication access  AKI (acute kidney injury) (HCC) Acute kidney injury Patient presents with a creatinine of 1.64 this is likely secondary to prerenal volume depletion due to osmotic diuresis If does not improve the course of treatment will follow with renal ultrasound and other investigations for alternative causes, holding his home naproxen   Hyperkalemia Hyperkalemia Life-threatening patient twelve-lead ECG sinus rhythm with ventricular rate of 107 QTc 459 PR 148 although very peaked T waves  Patient has received calcium  and Lokelmia in the emergency department, Lasix would not be appropriate given his volume status and his tachycardia want to avoid albuterol  Lactated Ringer 's continued given its relatively low potassium content the expected natural course of this patient's disease and the acid-base implications of changing normal saline Plan: Continue Lokelmia, continue to reverse his volume depleted status and his acidosis which is likely the primary driver of this potassium extracellular, giving IV sodium bicarbonate  as well, telemetry   Metabolic acidosis Metabolic acidosis Likely primarily driven from his DKA given the degree of hyperkalemia-given IV  sodium bicarb to drive intracellular as well as started oral bicarb, continue to follow his CO2 on BMP and repeat VBG as needed as DKA is treated, suspect that this will improve significantly with treatment of his underlying DKA   Scrotal skin lesion 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 Scrotal ultrasound ordered given the patient feels like some of these are intra scrotal Outpatient urology and/or dermatology follow-up   Chest pain   Chest pain Troponins negative and ECG without any acute ST changes   Nausea & vomiting  Nausea and vomiting Likely symptomatic to the patient's DKA ordering symptomatic relief  Leukocytosis Leukocytosis Denies any localizing infective symptoms believe this is likely reactive, to follow  Hyponatremia  Hyponatremia Pseudohyponatremia 141 corrected for the glucose     Advance Care Planning:   Code Status: Full Code   Consults: None  Family Communication: Nil as per patient   Severity of Illness: The appropriate patient status for this patient is INPATIENT. Inpatient status is judged to be reasonable and necessary in order to provide the required intensity of service to ensure the patient's safety. The patient's presenting symptoms, physical exam findings, and initial radiographic and laboratory data in the context of their chronic comorbidities is felt to place them at high risk for further clinical deterioration. Furthermore, it is not anticipated that the patient will be medically stable for discharge from the hospital within 2 midnights of admission.   * I certify that at the point of admission it is my clinical judgment that the patient will require inpatient hospital care spanning beyond 2 midnights from the point of admission due to high intensity of service, high risk for further deterioration and high frequency of surveillance required.*  Upon my evaluation, this patient had a high probability of  imminent or life-threatening deterioration due to  Diabetic ketoacidosis with life-threatening hyperkalemia, which required my direct attention, intervention, and personal management. I have personally provided 42 minutes of critical care time exclusive of time spent on separately billable procedures. Time includes review of laboratory data, radiology results, discussion with consultants, and monitoring for potential decompensation. Interventions were performed as documented above.   Author: Prentice JAYSON Lowenstein, MD 05/29/2024 1:21 AM  For on call review [email or web address].

## 2024-05-29 NOTE — Assessment & Plan Note (Signed)
   Chest pain Troponins negative and ECG without any acute ST changes

## 2024-05-29 NOTE — Assessment & Plan Note (Signed)
  Nausea and vomiting Likely symptomatic to the patient's DKA ordering symptomatic relief

## 2024-05-29 NOTE — Assessment & Plan Note (Signed)
  Hyponatremia Pseudohyponatremia 141 corrected for the glucose

## 2024-05-29 NOTE — Assessment & Plan Note (Signed)
 Metabolic acidosis Likely primarily driven from his DKA given the degree of hyperkalemia-given IV sodium bicarb to drive intracellular as well as started oral bicarb, continue to follow his CO2 on BMP and repeat VBG as needed as DKA is treated, suspect that this will improve significantly with treatment of his underlying DKA

## 2024-05-29 NOTE — Assessment & Plan Note (Signed)
 Hyperkalemia Life-threatening patient twelve-lead ECG sinus rhythm with ventricular rate of 107 QTc 459 PR 148 although very peaked T waves Patient has received calcium  and Lokelmia in the emergency department, Lasix would not be appropriate given his volume status and his tachycardia want to avoid albuterol  Lactated Ringer 's continued given its relatively low potassium content the expected natural course of this patient's disease and the acid-base implications of changing normal saline Plan: Continue Lokelmia, continue to reverse his volume depleted status and his acidosis which is likely the primary driver of this potassium extracellular, giving IV sodium bicarbonate  as well, telemetry

## 2024-05-29 NOTE — Assessment & Plan Note (Signed)
 Diabetic ketoacidosis Patient presents with a glucose in the high 500s, pH 7.07 positive ketones and ion gap of 28 Patient has received a 2 L crystalloid bolus Plan: Endo tool DKA protocol IV insulin  infusion, every 4 hourly BMP follow for ketone clearance and improving acid-base status, will bridge when appropriate, continue IV crystalloid at 200 mL/h, diabetes educator and TOC consultation given barriers to medication access

## 2024-05-29 NOTE — Assessment & Plan Note (Signed)
 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

## 2024-05-29 NOTE — Assessment & Plan Note (Signed)
 Acute kidney injury Patient presents with a creatinine of 1.64 this is likely secondary to prerenal volume depletion due to osmotic diuresis If does not improve the course of treatment will follow with renal ultrasound and other investigations for alternative causes, holding his home naproxen 

## 2024-05-30 ENCOUNTER — Telehealth (HOSPITAL_COMMUNITY): Payer: Self-pay | Admitting: Pharmacy Technician

## 2024-05-30 ENCOUNTER — Other Ambulatory Visit (HOSPITAL_COMMUNITY): Payer: Self-pay

## 2024-05-30 DIAGNOSIS — E1110 Type 2 diabetes mellitus with ketoacidosis without coma: Secondary | ICD-10-CM | POA: Diagnosis not present

## 2024-05-30 LAB — CBC WITH DIFFERENTIAL/PLATELET
Abs Immature Granulocytes: 0.04 K/uL (ref 0.00–0.07)
Basophils Absolute: 0 K/uL (ref 0.0–0.1)
Basophils Relative: 0 %
Eosinophils Absolute: 0 K/uL (ref 0.0–0.5)
Eosinophils Relative: 0 %
HCT: 41.8 % (ref 39.0–52.0)
Hemoglobin: 14.2 g/dL (ref 13.0–17.0)
Immature Granulocytes: 0 %
Lymphocytes Relative: 15 %
Lymphs Abs: 1.7 K/uL (ref 0.7–4.0)
MCH: 29 pg (ref 26.0–34.0)
MCHC: 34 g/dL (ref 30.0–36.0)
MCV: 85.3 fL (ref 80.0–100.0)
Monocytes Absolute: 0.7 K/uL (ref 0.1–1.0)
Monocytes Relative: 6 %
Neutro Abs: 9.1 K/uL — ABNORMAL HIGH (ref 1.7–7.7)
Neutrophils Relative %: 79 %
Platelets: 212 K/uL (ref 150–400)
RBC: 4.9 MIL/uL (ref 4.22–5.81)
RDW: 13.3 % (ref 11.5–15.5)
WBC: 11.6 K/uL — ABNORMAL HIGH (ref 4.0–10.5)
nRBC: 0 % (ref 0.0–0.2)

## 2024-05-30 LAB — BASIC METABOLIC PANEL WITH GFR
Anion gap: 13 (ref 5–15)
Anion gap: 12 (ref 5–15)
BUN: 11 mg/dL (ref 6–20)
BUN: 10 mg/dL (ref 6–20)
CO2: 20 mmol/L — ABNORMAL LOW (ref 22–32)
CO2: 22 mmol/L (ref 22–32)
Calcium: 9.4 mg/dL (ref 8.9–10.3)
Calcium: 9.1 mg/dL (ref 8.9–10.3)
Chloride: 109 mmol/L (ref 98–111)
Chloride: 108 mmol/L (ref 98–111)
Creatinine, Ser: 0.9 mg/dL (ref 0.61–1.24)
Creatinine, Ser: 0.88 mg/dL (ref 0.61–1.24)
GFR, Estimated: >60 mL/min (ref >60)
GFR, Estimated: >60 mL/min (ref >60)
Glucose, Bld: 157 mg/dL — ABNORMAL HIGH (ref 70–99)
Glucose, Bld: 186 mg/dL — ABNORMAL HIGH (ref 70–99)
Potassium: 3.2 mmol/L — ABNORMAL LOW (ref 3.5–5.1)
Potassium: 3.8 mmol/L (ref 3.5–5.1)
Sodium: 142 mmol/L (ref 135–145)
Sodium: 142 mmol/L (ref 135–145)

## 2024-05-30 LAB — GLUCOSE, CAPILLARY
Glucose-Capillary: 139 mg/dL — ABNORMAL HIGH (ref 70–99)
Glucose-Capillary: 139 mg/dL — ABNORMAL HIGH (ref 70–99)
Glucose-Capillary: 148 mg/dL — ABNORMAL HIGH (ref 70–99)
Glucose-Capillary: 148 mg/dL — ABNORMAL HIGH (ref 70–99)
Glucose-Capillary: 161 mg/dL — ABNORMAL HIGH (ref 70–99)
Glucose-Capillary: 162 mg/dL — ABNORMAL HIGH (ref 70–99)
Glucose-Capillary: 171 mg/dL — ABNORMAL HIGH (ref 70–99)
Glucose-Capillary: 181 mg/dL — ABNORMAL HIGH (ref 70–99)
Glucose-Capillary: 182 mg/dL — ABNORMAL HIGH (ref 70–99)
Glucose-Capillary: 190 mg/dL — ABNORMAL HIGH (ref 70–99)
Glucose-Capillary: 201 mg/dL — ABNORMAL HIGH (ref 70–99)
Glucose-Capillary: 203 mg/dL — ABNORMAL HIGH (ref 70–99)
Glucose-Capillary: 203 mg/dL — ABNORMAL HIGH (ref 70–99)
Glucose-Capillary: 210 mg/dL — ABNORMAL HIGH (ref 70–99)
Glucose-Capillary: 213 mg/dL — ABNORMAL HIGH (ref 70–99)
Glucose-Capillary: 220 mg/dL — ABNORMAL HIGH (ref 70–99)
Glucose-Capillary: 68 mg/dL — ABNORMAL LOW (ref 70–99)
Glucose-Capillary: 74 mg/dL (ref 70–99)

## 2024-05-30 LAB — BETA-HYDROXYBUTYRIC ACID
Beta-Hydroxybutyric Acid: 2.56 mmol/L — ABNORMAL HIGH (ref 0.05–0.27)
Beta-Hydroxybutyric Acid: 3.14 mmol/L — ABNORMAL HIGH (ref 0.05–0.27)

## 2024-05-30 LAB — PHOSPHORUS: Phosphorus: 2.1 mg/dL — ABNORMAL LOW (ref 2.5–4.6)

## 2024-05-30 MED ORDER — PROCHLORPERAZINE EDISYLATE 10 MG/2ML IJ SOLN
10.0000 mg | Freq: Four times a day (QID) | INTRAMUSCULAR | Status: DC | PRN
  Administered 2024-05-30: 10 mg via INTRAVENOUS
  Filled 2024-05-30: qty 2

## 2024-05-30 MED ORDER — K PHOS MONO-SOD PHOS DI & MONO 155-852-130 MG PO TABS
250.0000 mg | ORAL_TABLET | Freq: Two times a day (BID) | ORAL | Status: DC
  Filled 2024-05-30: qty 1

## 2024-05-30 MED ORDER — INSULIN ASPART 100 UNIT/ML IJ SOLN
4.0000 [IU] | Freq: Three times a day (TID) | INTRAMUSCULAR | Status: DC
  Administered 2024-05-30 – 2024-05-31 (×2): 4 [IU] via SUBCUTANEOUS
  Filled 2024-05-30 (×2): qty 1

## 2024-05-30 MED ORDER — INSULIN ASPART 100 UNIT/ML IJ SOLN: 0.0000 [IU] | Freq: Every day | INTRAMUSCULAR | Status: DC

## 2024-05-30 MED ORDER — INSULIN GLARGINE-YFGN 100 UNIT/ML ~~LOC~~ SOLN
30.0000 [IU] | SUBCUTANEOUS | Status: DC
  Administered 2024-05-30: 30 [IU] via SUBCUTANEOUS
  Filled 2024-05-30 (×2): qty 0.3

## 2024-05-30 MED ORDER — POTASSIUM PHOSPHATES 15 MMOLE/5ML IV SOLN
21.0000 mmol | Freq: Once | INTRAVENOUS | Status: DC
  Filled 2024-05-30: qty 7

## 2024-05-30 MED ORDER — INSULIN ASPART 100 UNIT/ML IJ SOLN
0.0000 [IU] | Freq: Three times a day (TID) | INTRAMUSCULAR | Status: DC
  Administered 2024-05-30: 1 [IU] via SUBCUTANEOUS
  Administered 2024-05-31: 3 [IU] via SUBCUTANEOUS
  Filled 2024-05-30 (×2): qty 1

## 2024-05-30 MED ORDER — K PHOS MONO-SOD PHOS DI & MONO 155-852-130 MG PO TABS
500.0000 mg | ORAL_TABLET | Freq: Two times a day (BID) | ORAL | Status: AC
  Administered 2024-05-30 (×2): 500 mg via ORAL
  Filled 2024-05-30 (×2): qty 2

## 2024-05-30 MED ORDER — INSULIN GLARGINE 100 UNITS/ML SOLOSTAR PEN: 10.0000 [IU] | PEN_INJECTOR | SUBCUTANEOUS | Status: DC

## 2024-05-30 MED ORDER — POTASSIUM CHLORIDE CRYS ER 20 MEQ PO TBCR
40.0000 meq | EXTENDED_RELEASE_TABLET | Freq: Once | ORAL | Status: AC
  Administered 2024-05-30: 40 meq via ORAL
  Filled 2024-05-30: qty 2

## 2024-05-30 MED ORDER — INSULIN GLARGINE-YFGN 100 UNIT/ML ~~LOC~~ SOLN
10.0000 [IU] | SUBCUTANEOUS | Status: DC
  Filled 2024-05-30: qty 0.1

## 2024-05-30 NOTE — Progress Notes (Signed)
 1030 Dr.Djan placed diet and long acting insulin 

## 2024-05-30 NOTE — Consult Note (Addendum)
 PHARMACY CONSULT NOTE - ELECTROLYTES  Pharmacy Consult for Electrolyte Monitoring and Replacement   Recent Labs: Potassium (mmol/L)  Date Value  05/30/2024 3.8  12/24/2011 4.7   Magnesium  (mg/dL)  Date Value  91/82/7974 2.0   Calcium  (mg/dL)  Date Value  91/81/7974 9.1   Calcium , Total (mg/dL)  Date Value  96/86/7986 8.8 (L)   Albumin (g/dL)  Date Value  92/98/7975 5.0  12/24/2011 3.7 (L)   Phosphorus (mg/dL)  Date Value  91/81/7974 2.1 (L)   Sodium (mmol/L)  Date Value  05/30/2024 142  12/24/2011 136   Height: 5' 9 (175.3 cm) Weight: 71.5 kg (157 lb 10.1 oz) IBW/kg (Calculated) : 70.7 Estimated Creatinine Clearance: 126.1 mL/min (by C-G formula based on SCr of 0.88 mg/dL).  Assessment  Wesley [name] is a 28 y.o. male presenting with DKA. PMH significant for T1DM and DKA. Pharmacy has been consulted to monitor and replace electrolytes.  Diet: Carb modified MIVF: Received fluids per protocol Pertinent medications: Transitioning off insulin  gtt  Goal of Therapy: Electrolytes within normal limits  Plan:  K 3.8, phos 2.1 - K Phos  Neutral 500 mg BID x 2 doses Will sign off electrolyte consult now that patient is transitioning off insulin  gtt  Thank you for allowing pharmacy to be a part of this patient's care.  Marolyn KATHEE Mare 05/30/2024 11:31 AM

## 2024-05-30 NOTE — Inpatient Diabetes Management (Signed)
 Inpatient Diabetes Program Recommendations  AACE/ADA: New Consensus Statement on Inpatient Glycemic Control (2015)  Target Ranges:  Prepandial:   less than 140 mg/dL      Peak postprandial:   less than 180 mg/dL (1-2 hours)      Critically ill patients:  140 - 180 mg/dL   Lab Results  Component Value Date   GLUCAP 203 (H) 05/30/2024   HGBA1C 11.5 (H) 04/14/2023    Review of Glycemic Control  Latest Reference Range & Units 05/30/24 08:52  CO2 22 - 32 mmol/L 22  Beta-Hydroxybutyric Acid 0.05 - 0.27 mmol/L 3.14 (H)  (H): Data is abnormally high (L): Data is abnormally low Diabetes history: DM1 Outpatient Diabetes medications: 70/30 30 units BID Current orders for Inpatient glycemic control: IV insulin -transitioning to Semglee  10 units every day, Novolog  0-9 units TID and 0-5 units QHS  Inpatient Diabetes Program Recommendations:    Semglee  30 units every day (administer 2 hours prior to discontinuing IV insulin  Novolog  4 units TID with meals if he consumes at least 50% Novolog  0-9 units TID and 0-5 units at bedtime Carb modified diet  Thank you, Wyvonna Pinal, MSN, CDCES Diabetes Coordinator Inpatient Diabetes Program [phone] (team pager from 8a-5p)

## 2024-05-30 NOTE — Progress Notes (Signed)
 Progress Note   Patient: Wesley [name] FMW:[id number] DOB: October 22, 1995 DOA: 05/28/2024     2 DOS: the patient was seen and examined on 05/30/2024    Brief hospital course: From HPI Wesley [name] is a 28 year old gentleman with a past medical history of type 1 diabetes mellitus with previous admissions for diabetic ketoacidosis presented to the emergency department today with nausea and vomiting feeling generally unwell and some chest pain patient reports that this is similar to how he felt previously he had diabetic ketoacidosis.  The patient reports compliance to his home insulin  regimen which she reports is 70/30 insulin  30 units twice daily (different than the medication reconciliation of Lantus  18 and 3 units of aspart with meals due to financial reasons).   He denies any current chest pain he denies any infective symptoms on his skin anywhere any fever or chills although does report he has some concerns regarding lesions on his scrotum over the past year she was examined as per the patient's request.  The bedside nurse was used as a chaperone.   Past medical records reviewed and summarized:  Patient had a fast med visit 09/21/2023 for an axilla abscess had I&D and then was discharged on antibiotics DKA admission discharged 04/15/2023 following inpatient admission for DKA following taking of less insulin  than prescribed    Assessment and Plan:   DKA (diabetic ketoacidosis) (HCC) Diabetic ketoacidosis DKA resolved Continue subcutaneous insulin    AKI (acute kidney injury) (HCC) Renal function improved Avoid nephrotoxic medications   Hyperkalemia Continue management of DKA protocol   Metabolic acidosis Improved     Scrotal skin lesion 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 Scrotal ultrasound ordered given the patient feels like some of these are intra scrotal Outpatient urology and/or dermatology follow-up     Chest pain Resolved      Nausea & vomiting   Nausea and vomiting Likely symptomatic to the patient's DKA  symptomatic relief   Leukocytosis Leukocytosis Denies any localizing infective symptoms believe this is likely reactive   Pseudohyponatremia Improved        Advance Care Planning:   Code Status: Full Code    Consults: None   Family Communication: Nil as per patient    Subjective:  Patient denies any acute overnight event Anion gap is closed Insulin  drip being bridged to subcutaneous insulin    Physical Exam:   General: Young male laying in bed in no acute distress Respiratory:    Respiratory Effort Normal:  Cardiovascular:  Regular Regular tachycardia without any added sounds or murmurs Gastrointestinal: Abdomen soft and nontender without palpable masses Psychiatric: Normal mood CNS: Alert and oriented x 3    Data Reviewed: Chest x-ray reviewed that did not show any acute pathology    Vitals:   05/30/24 1200 05/30/24 1300 05/30/24 1400 05/30/24 1500  BP: (!) 143/98 128/82 124/86 (!) 157/99  Pulse: 71 91 81 84  Resp: 16 17 15  (!) 0  Temp: 98.2 F (36.8 C)     TempSrc: Oral     SpO2: 99% 99% 100% 100%  Weight:      Height:          Latest Ref Rng & Units 05/30/2024    3:33 AM 05/29/2024    7:56 AM 05/29/2024    1:55 AM  CBC  WBC 4.0 - 10.5 K/uL 11.6  15.1  17.2   Hemoglobin 13.0 - 17.0 g/dL 85.7  85.1  85.2   Hematocrit 39.0 - 52.0 % 41.8  43.6  44.6   Platelets 150 - 400 K/uL 212  231  236        Latest Ref Rng & Units 05/30/2024    8:52 AM 05/30/2024    3:33 AM 05/29/2024    8:29 PM  BMP  Glucose 70 - 99 mg/dL 813  842  829   BUN 6 - 20 mg/dL 10  11  14    Creatinine 0.61 - 1.24 mg/dL 9.11  9.09  9.04   Sodium 135 - 145 mmol/L 142  142  142   Potassium 3.5 - 5.1 mmol/L 3.8  3.2  3.8   Chloride 98 - 111 mmol/L 108  109  109   CO2 22 - 32 mmol/L 22  20  18    Calcium  8.9 - 10.3 mg/dL 9.1  9.4  9.6      Author: Drue ONEIDA Potter, MD 05/30/2024 4:30 PM  For on call  review [email or web address].

## 2024-05-30 NOTE — Progress Notes (Signed)
 Good day. Weaned off of Insulin  drip at 1530. No report of pain or nausea. Not much appetite either.

## 2024-05-30 NOTE — Telephone Encounter (Signed)
 Patient Product/process development scientist completed.    The patient is insured through [place] [employer].     Ran test claim for Basaglar  Pen and the current 30 day co-pay is $4.00.  Ran test claim for Novolog  FlexPen and the current 30 day co-pay is $4.00.  This test claim was processed through [facility]- copay amounts may vary at other pharmacies due to pharmacy/plan contracts, or as the patient moves through the different stages of their insurance plan.     Reyes Sharps, CPHT Pharmacy Technician III Certified Patient Advocate [facility] Patient Advocate Team Direct Number: [phone]  Fax: [phone]

## 2024-05-30 NOTE — Inpatient Diabetes Management (Signed)
 Inpatient Diabetes Program Recommendations  AACE/ADA: New Consensus Statement on Inpatient Glycemic Control (2015)  Target Ranges:  Prepandial:   less than 140 mg/dL      Peak postprandial:   less than 180 mg/dL (1-2 hours)      Critically ill patients:  140 - 180 mg/dL   Lab Results  Component Value Date   GLUCAP 171 (H) 05/30/2024   HGBA1C 11.5 (H) 04/14/2023    Review of Glycemic Control  Diabetes history: DM1 Outpatient Diabetes medications: 70/30 30 units BID Current orders for Inpatient glycemic control: IV insulin -transitioning to  Semglee  30 units every day,  Novolog  4 units tid meal coverage Novolog  0-9 units TID and 0-5 units QHS  Inpatient Diabetes Program Recommendations:   Spoke with patient @ bedside. Patient states he pays for his insulin  70/30 from [employer] due to not having a prescription for basal bolus. Patient states his Medicaid is current and should cover insulin . On 04/15/23, a DM coordinator gave patient a CGM (his phone does not compatible without a credit card attached to it and patient states he still does not have attached.) States he needs a glucose meter and supplies. Please consider changing insulin  to basal bolus on discharge. Patient states he did not follow up with [facility] endocrinology as planned.  Thank you, Apollos Tenbrink E. Teea Ducey, RN, MSN, CDCES  Diabetes Coordinator Inpatient Glycemic Control Team Team Pager [phone] (8am-5pm) 05/30/2024 1:37 PM

## 2024-05-31 ENCOUNTER — Other Ambulatory Visit: Payer: Self-pay

## 2024-05-31 DIAGNOSIS — E1110 Type 2 diabetes mellitus with ketoacidosis without coma: Secondary | ICD-10-CM | POA: Diagnosis not present

## 2024-05-31 LAB — BASIC METABOLIC PANEL WITH GFR
Anion gap: 10 (ref 5–15)
BUN: 8 mg/dL (ref 6–20)
CO2: 27 mmol/L (ref 22–32)
Calcium: 9.3 mg/dL (ref 8.9–10.3)
Chloride: 105 mmol/L (ref 98–111)
Creatinine, Ser: 0.95 mg/dL (ref 0.61–1.24)
GFR, Estimated: >60 mL/min (ref >60)
Glucose, Bld: 140 mg/dL — ABNORMAL HIGH (ref 70–99)
Potassium: 2.9 mmol/L — ABNORMAL LOW (ref 3.5–5.1)
Sodium: 142 mmol/L (ref 135–145)

## 2024-05-31 LAB — CBC WITH DIFFERENTIAL/PLATELET
Abs Immature Granulocytes: 0.01 K/uL (ref 0.00–0.07)
Basophils Absolute: 0 K/uL (ref 0.0–0.1)
Basophils Relative: 0 %
Eosinophils Absolute: 0 K/uL (ref 0.0–0.5)
Eosinophils Relative: 0 %
HCT: 36.3 % — ABNORMAL LOW (ref 39.0–52.0)
Hemoglobin: 12.4 g/dL — ABNORMAL LOW (ref 13.0–17.0)
Immature Granulocytes: 0 %
Lymphocytes Relative: 37 %
Lymphs Abs: 2.4 K/uL (ref 0.7–4.0)
MCH: 29.2 pg (ref 26.0–34.0)
MCHC: 34.2 g/dL (ref 30.0–36.0)
MCV: 85.4 fL (ref 80.0–100.0)
Monocytes Absolute: 0.4 K/uL (ref 0.1–1.0)
Monocytes Relative: 6 %
Neutro Abs: 3.6 K/uL (ref 1.7–7.7)
Neutrophils Relative %: 57 %
Platelets: 164 K/uL (ref 150–400)
RBC: 4.25 MIL/uL (ref 4.22–5.81)
RDW: 13.1 % (ref 11.5–15.5)
WBC: 6.4 K/uL (ref 4.0–10.5)
nRBC: 0 % (ref 0.0–0.2)

## 2024-05-31 LAB — GLUCOSE, CAPILLARY: Glucose-Capillary: 214 mg/dL — ABNORMAL HIGH (ref 70–99)

## 2024-05-31 LAB — HEMOGLOBIN A1C
Hgb A1c MFr Bld: 12.1 % — ABNORMAL HIGH (ref 4.8–5.6)
Mean Plasma Glucose: 301 mg/dL

## 2024-05-31 MED ORDER — POTASSIUM CHLORIDE CRYS ER 20 MEQ PO TBCR
60.0000 meq | EXTENDED_RELEASE_TABLET | Freq: Once | ORAL | Status: AC
  Administered 2024-05-31: 60 meq via ORAL
  Filled 2024-05-31: qty 3

## 2024-05-31 MED ORDER — POTASSIUM CHLORIDE CRYS ER 20 MEQ PO TBCR
40.0000 meq | EXTENDED_RELEASE_TABLET | Freq: Two times a day (BID) | ORAL | 0 refills | Status: AC
  Filled 2024-05-31: qty 4, 1d supply, fill #0

## 2024-05-31 MED ORDER — POTASSIUM CHLORIDE CRYS ER 20 MEQ PO TBCR: 40.0000 meq | EXTENDED_RELEASE_TABLET | ORAL | Status: DC

## 2024-05-31 NOTE — Discharge Summary (Signed)
 Physician Discharge Summary   Patient: Wesley [name] MRN: [id number] DOB: 10-20-1995  Admit date:     05/28/2024  Discharge date: 05/31/24  Discharge Physician: Drue ONEIDA Potter   [facility]   Recommendations at discharge:  Follow-up with PCP as well as urology Discharge Diagnoses: Principal Problem:   DKA (diabetic ketoacidosis) (HCC) Active Problems:   AKI (acute kidney injury) (HCC)   Hyperkalemia   Metabolic acidosis   Hyponatremia   Leukocytosis   Nausea & vomiting   Chest pain   Scrotal skin lesion  Resolved Problems:   * No resolved hospital problems. *  Hospital Course: From HPI Mr. Blanford is a 28 year old gentleman with a past medical history of type 1 diabetes mellitus with previous admissions for diabetic ketoacidosis presented to the emergency department today with nausea and vomiting feeling generally unwell and some chest pain patient reports that this is similar to how he felt previously he had diabetic ketoacidosis.  The patient reports compliance to his home insulin  regimen which she reports is 70/30 insulin  30 units twice daily (different than the medication reconciliation of Lantus  18 and 3 units of aspart with meals due to financial reasons).   He denies any current chest pain he denies any infective symptoms on his skin anywhere any fever or chills although does report he has some concerns regarding lesions on his scrotum over the past year she was examined as per the patient's request.  The bedside nurse was used as a chaperone.   Past medical records reviewed and summarized:  Patient had a fast med visit 09/21/2023 for an axilla abscess had I&D and then was discharged on antibiotics DKA admission discharged 04/15/2023 following inpatient admission for DKA following taking of less insulin  than prescribed   Patient underwent DKA protocol with improvement in metabolic acidosis as well as glucose level.  He was therefore  transitioned to subcutaneous insulin  and his symptoms resolved.  Medication compliance was discussed with patient and he has agreed to continue his home medications and will follow-up with PCP.  Patient assured me he has sufficient medication at home.   Patient will also follow-up with urology concerning the epididymal cyst seen on scrotal ultrasound.   Consultants: None Procedures performed: None Disposition: Home Diet recommendation:  Carb modified diet DISCHARGE MEDICATION: Allergies as of 05/31/2024   No Known Allergies      Medication List     TAKE these medications    insulin  aspart 100 UNIT/ML FlexPen Commonly known as: NOVOLOG  Inject 4 Units into the skin 3 (three) times daily with meals.   insulin  glargine 100 UNIT/ML Solostar Pen Commonly known as: LANTUS  Inject 18 Units into the skin at bedtime.   insulin  regular 100 units/mL injection Commonly known as: NOVOLIN R Inject 30 Units into the skin 2 (two) times daily in the am and at bedtime..   potassium chloride  SA 20 MEQ tablet Commonly known as: KLOR-CON  M Take 2 tablets (40 mEq total) by mouth 2 (two) times daily for 1 day. Start taking on: June 01, 2024        Discharge Exam: Fredricka Weights   05/28/24 2207 05/29/24 0030  Weight: 77.7 kg 71.5 kg   General: Young male laying in bed in no acute distress Respiratory:    Respiratory Effort Normal:  Cardiovascular:  Regular Regular tachycardia without any added sounds or murmurs Gastrointestinal: Abdomen soft and nontender without palpable masses Psychiatric: Normal mood CNS: Alert and oriented x 3  Condition at discharge: good  The results of significant diagnostics from this hospitalization (including imaging, microbiology, ancillary and laboratory) are listed below for reference.   Imaging Studies: US  SCROTUM Result Date: 05/29/2024 CLINICAL DATA:  Swelling with palpable nodules in the scrotum. EXAM: ULTRASOUND OF SCROTUM TECHNIQUE: Complete  ultrasound examination of the testicles, epididymis, and other scrotal structures was performed. COMPARISON:  04/14/2023 FINDINGS: Right testicle Measurements: 4.5 x 1.5 x 2.9 cm. No mass or microlithiasis visualized. Normal color Doppler flow. Left testicle Measurements: 4.9 x 1.8 x 2.3 cm. No mass or microlithiasis visualized. Normal color Doppler flow. Right epididymis:  Normal in size and appearance. Left epididymis:  8 mm left epididymal cyst.  Previously 6 mm. Hydrocele:  None visualized. Varicocele:  None visualized. Other: Multiple isoechoic nodules within the wall of the scrotum identified corresponding to the areas of palpable abnormality. These measure up to 0.8 cm in may reflect multiple subcutaneous epidermal inclusion cysts. IMPRESSION: 1. No testicular mass or torsion. 2. Multiple isoechoic nodules within the wall of the scrotum corresponding to the areas of palpable abnormality. These measure up to 0.8 cm and may reflect multiple subcutaneous epidermal inclusion cysts. 3. 8 mm left epididymal cyst. Electronically Signed   By: Waddell Calk M.D.   On: 05/29/2024 10:31   DG Chest 1 View Result Date: 05/28/2024 CLINICAL DATA:  Chest pain EXAM: CHEST  1 VIEW COMPARISON:  None Available. FINDINGS: The heart size and mediastinal contours are within normal limits. Both lungs are clear. The visualized skeletal structures are unremarkable. No pneumothorax. IMPRESSION: No active disease. Electronically Signed   By: Franky Crease M.D.   On: 05/28/2024 20:15    Microbiology: Results for orders placed or performed during the hospital encounter of 05/18/22  SARS Coronavirus 2 by RT PCR (hospital order, performed in [facility] hospital lab) *cepheid single result test* Anterior Nasal Swab     Status: None   Collection Time: 05/18/22 10:45 PM   Specimen: Anterior Nasal Swab  Result Value Ref Range Status   SARS Coronavirus 2 by RT PCR NEGATIVE NEGATIVE Final    Comment: (NOTE) SARS-CoV-2 target  nucleic acids are NOT DETECTED.  The SARS-CoV-2 RNA is generally detectable in upper and lower respiratory specimens during the acute phase of infection. The lowest concentration of SARS-CoV-2 viral copies this assay can detect is 250 copies / mL. A negative result does not preclude SARS-CoV-2 infection and should not be used as the sole basis for treatment or other patient management decisions.  A negative result may occur with improper specimen collection / handling, submission of specimen other than nasopharyngeal swab, presence of viral mutation(s) within the areas targeted by this assay, and inadequate number of viral copies (<250 copies / mL). A negative result must be combined with clinical observations, patient history, and epidemiological information.  Fact Sheet for Patients:   RoadLapTop.co.za  Fact Sheet for Healthcare Providers: http://kim-miller.com/  This test is not yet approved or  cleared by the [place]  FDA and has been authorized for detection and/or diagnosis of SARS-CoV-2 by FDA under an Emergency Use Authorization (EUA).  This EUA will remain in effect (meaning this test can be used) for the duration of the COVID-19 declaration under Section 564(b)(1) of the Act, 21 U.S.C. section 360bbb-3(b)(1), unless the authorization is terminated or revoked sooner.  Performed at [facility], [address] [place], [place] [address]     Labs: CBC: Recent Labs  Lab 05/28/24 1924 05/29/24 0155 05/29/24 0756 05/30/24  9666 05/31/24 0337  WBC 11.5* 17.2* 15.1* 11.6* 6.4  NEUTROABS  --   --   --  9.1* 3.6  HGB 15.4 14.7 14.8 14.2 12.4*  HCT 47.6 44.6 43.6 41.8 36.3*  MCV 90.5 88.8 86.2 85.3 85.4  PLT 245 236 231 212 164   Basic Metabolic Panel: Recent Labs  Lab 05/29/24 0756 05/29/24 1222 05/29/24 2029 05/30/24 0333 05/30/24 0852 05/31/24 1020  NA 141 142 142 142 142 142  K 4.4 4.4 3.8 3.2*  3.8 2.9*  CL 108 109 109 109 108 105  CO2 19* 17* 18* 20* 22 27  GLUCOSE 189* 154* 170* 157* 186* 140*  BUN 20 18 14 11 10 8   CREATININE 1.19 1.24 0.95 0.90 0.88 0.95  CALCIUM  9.7 9.8 9.6 9.4 9.1 9.3  MG 2.0  --   --   --   --   --   PHOS 2.2*  --   --   --  2.1*  --    Liver Function Tests: No results for input(s): AST, ALT, ALKPHOS, BILITOT, PROT, ALBUMIN in the last 168 hours. CBG: Recent Labs  Lab 05/30/24 1656 05/30/24 2056 05/30/24 2124 05/30/24 2347 05/31/24 0817  GLUCAP 148* 68* 74 220* 214*    Discharge time spent:  36 minutes.  Signed: Drue ONEIDA Potter, MD [facility] 05/31/2024

## 2024-05-31 NOTE — Plan of Care (Signed)

## 2024-05-31 NOTE — TOC CM/SW Note (Signed)
 Transition of Care [facility]) - Inpatient Brief Assessment   Patient Details  Name: Wesley [name] MRN: [id number] Date of Birth: [birth year]  Transition of Care [facility]) CM/SW Contact:    Corean ONEIDA Haddock, RN Phone Number: 05/31/2024, 11:04 AM   Clinical Narrative:   Transition of Care [facility]) Screening Note   Patient Details  Name: Wesley [name] Date of Birth: 1996/10/13   Transition of Care [facility]) CM/SW Contact:    Corean ONEIDA Haddock, RN Phone Number: 05/31/2024, 11:04 AM    Transition of Care Department [facility]) has reviewed patient and no TOC needs have been identified at this time. . If new patient transition needs arise, please place a TOC consult.   TOC consult noted for medication assistance.  Patient noted to have Medicaid coverage.  Per Pharmacy note copay for $4.  MD and pharmacy notified that there is no additional assistance TOC can provide for medicaid copays Transition of Care Asessment: Insurance and Status: Insurance coverage has been reviewed Patient has primary care physician: Yes     Prior/Current Home Services: No current home services Social Drivers of Health Review: SDOH reviewed no interventions necessary Readmission risk has been reviewed: Yes Transition of care needs: no transition of care needs at this time

## 2024-06-07 ENCOUNTER — Telehealth: Payer: Self-pay

## 2024-06-07 DIAGNOSIS — E1010 Type 1 diabetes mellitus with ketoacidosis without coma: Secondary | ICD-10-CM

## 2024-06-10 ENCOUNTER — Other Ambulatory Visit: Payer: Self-pay

## 2024-06-17 ENCOUNTER — Telehealth: Payer: Self-pay

## 2024-06-17 NOTE — Progress Notes (Signed)
 Complex Care Management Note Care Guide Note  06/17/2024 Name: Wesley [name] MRN: [id number] DOB: 1995/12/24   Complex Care Management Outreach Attempts: An unsuccessful telephone outreach was attempted today to offer the patient information about available complex care management services.  Follow Up Plan:  Additional outreach attempts will be made to offer the patient complex care management information and services.   Encounter Outcome:  Patient Request to Call Back  Dreama [facility]  [facility], [facility] VBCI Assistant Direct Dial: [phone]  Fax: [phone]

## 2024-06-23 NOTE — Progress Notes (Signed)
 Complex Care Management Note Care Guide Note  06/23/2024 Name: Wesley [name] MRN: [id number] DOB: Apr 13, 1996   Complex Care Management Outreach Attempts: A second unsuccessful outreach was attempted today to offer the patient with information about available complex care management services.  Follow Up Plan:  Additional outreach attempts will be made to offer the patient complex care management information and services.   Encounter Outcome:  Patient Request to Call Back  Dreama [facility]  [facility], [facility] VBCI Assistant Direct Dial: [phone]  Fax: [phone]

## 2024-06-28 NOTE — Progress Notes (Signed)
 Thank you for this referral. The [facility] team receives referrals for patients who meet Complex Care Management program criteria: chronic conditions including heart failure, stroke, COPD, ESRD, Sickle Cell, Diabetes with complications, Mental/Behavioral Health diagnosis, substance abuse/misuse and whose Primary Care Provider is a [facility] provider or ACO contracted Building control surveyor in [place]).  This patient does not have a [facility] or [facility] Primary Care Provider. VBCI Population Health cannot directly provide Care Management services to patients who do not have a qualifying Primary Care Provider. Please call us  if you need further clarification at [phone].    Patient given E2C2 community service information [phone] and will update plan.  Dreama [facility]  [facility], [facility] VBCI Assistant Direct Dial: [phone]  Fax: [phone]
